# Patient Record
Sex: Male | Born: 1950
Health system: Southern US, Community
[De-identification: ages and names within clinical notes are randomized; demographics above are authoritative.]

## PROBLEM LIST (undated history)

## (undated) DIAGNOSIS — N210 Calculus in bladder: Secondary | ICD-10-CM

## (undated) DIAGNOSIS — R519 Headache, unspecified: Secondary | ICD-10-CM

## (undated) DIAGNOSIS — M1990 Unspecified osteoarthritis, unspecified site: Secondary | ICD-10-CM

## (undated) DIAGNOSIS — I10 Essential (primary) hypertension: Secondary | ICD-10-CM

## (undated) DIAGNOSIS — G4730 Sleep apnea, unspecified: Secondary | ICD-10-CM

## (undated) DIAGNOSIS — Z9289 Personal history of other medical treatment: Secondary | ICD-10-CM

## (undated) DIAGNOSIS — I493 Ventricular premature depolarization: Secondary | ICD-10-CM

## (undated) DIAGNOSIS — I452 Bifascicular block: Secondary | ICD-10-CM

## (undated) DIAGNOSIS — J189 Pneumonia, unspecified organism: Secondary | ICD-10-CM

## (undated) DIAGNOSIS — C4490 Unspecified malignant neoplasm of skin, unspecified: Secondary | ICD-10-CM

## (undated) HISTORY — DX: Essential (primary) hypertension: I10

## (undated) HISTORY — DX: Ventricular premature depolarization: I49.3

## (undated) HISTORY — DX: Bifascicular block: I45.2

## (undated) HISTORY — PX: MOHS SURGERY: SUR867

## (undated) HISTORY — DX: Personal history of other medical treatment: Z92.89

## (undated) HISTORY — PX: OTHER SURGICAL HISTORY: SHX169

## (undated) HISTORY — PX: HEMORROIDECTOMY: SUR656

## (undated) HISTORY — PX: SHOULDER SURGERY: SHX246

## (undated) HISTORY — PX: TONSILLECTOMY: SUR1361

## (undated) HISTORY — PX: KNEE SURGERY: SHX244

## (undated) HISTORY — PX: VASECTOMY: SHX75

---

## 2005-07-12 ENCOUNTER — Ambulatory Visit (HOSPITAL_COMMUNITY): Admission: RE | Admit: 2005-07-12 | Discharge: 2005-07-12 | Payer: Self-pay | Admitting: Specialist

## 2007-04-27 ENCOUNTER — Ambulatory Visit: Payer: Self-pay

## 2007-05-06 ENCOUNTER — Ambulatory Visit: Payer: Self-pay | Admitting: Orthopaedic Surgery

## 2007-05-07 ENCOUNTER — Ambulatory Visit: Payer: Self-pay | Admitting: Orthopaedic Surgery

## 2009-02-14 ENCOUNTER — Ambulatory Visit: Payer: Self-pay | Admitting: Surgery

## 2009-02-15 ENCOUNTER — Ambulatory Visit: Payer: Self-pay | Admitting: Surgery

## 2012-07-21 ENCOUNTER — Emergency Department: Payer: Self-pay | Admitting: Emergency Medicine

## 2012-07-22 LAB — COMPREHENSIVE METABOLIC PANEL
Albumin: 4 g/dL (ref 3.4–5.0)
Alkaline Phosphatase: 73 U/L (ref 50–136)
Anion Gap: 12 (ref 7–16)
BUN: 14 mg/dL (ref 7–18)
Bilirubin,Total: 1.1 mg/dL — ABNORMAL HIGH (ref 0.2–1.0)
Calcium, Total: 9.2 mg/dL (ref 8.5–10.1)
Chloride: 102 mmol/L (ref 98–107)
Co2: 27 mmol/L (ref 21–32)
Creatinine: 0.83 mg/dL (ref 0.60–1.30)
EGFR (African American): >60
EGFR (Non-African Amer.): >60
Glucose: 117 mg/dL — ABNORMAL HIGH (ref 65–99)
Osmolality: 283 (ref 275–301)
Potassium: 3.4 mmol/L — ABNORMAL LOW (ref 3.5–5.1)
SGOT(AST): 14 U/L — ABNORMAL LOW (ref 15–37)
SGPT (ALT): 16 U/L (ref 12–78)
Sodium: 141 mmol/L (ref 136–145)
Total Protein: 7.4 g/dL (ref 6.4–8.2)

## 2012-07-22 LAB — URINALYSIS, COMPLETE
Bacteria: NONE SEEN
Bilirubin,UR: NEGATIVE
Blood: NEGATIVE
Glucose,UR: NEGATIVE mg/dL (ref 0–75)
Ketone: NEGATIVE
Leukocyte Esterase: NEGATIVE
Nitrite: NEGATIVE
Ph: 6 (ref 4.5–8.0)
Protein: NEGATIVE
RBC,UR: NONE SEEN /HPF (ref 0–5)
Specific Gravity: 1.005 (ref 1.003–1.030)
Squamous Epithelial: NONE SEEN
WBC UR: <1 /HPF (ref 0–5)

## 2012-07-22 LAB — CBC
HCT: 39.1 % — ABNORMAL LOW (ref 40.0–52.0)
HGB: 13.8 g/dL (ref 13.0–18.0)
MCH: 30.1 pg (ref 26.0–34.0)
MCHC: 35.3 g/dL (ref 32.0–36.0)
MCV: 85 fL (ref 80–100)
Platelet: 205 10*3/uL (ref 150–440)
RBC: 4.59 10*6/uL (ref 4.40–5.90)
RDW: 14.2 % (ref 11.5–14.5)
WBC: 10.4 10*3/uL (ref 3.8–10.6)

## 2012-07-22 LAB — CK TOTAL AND CKMB (NOT AT ARMC)
CK, Total: 118 U/L (ref 35–232)
CK-MB: 1.5 ng/mL (ref 0.5–3.6)

## 2012-07-22 LAB — TROPONIN I: Troponin-I: <0.02 ng/mL

## 2012-07-23 ENCOUNTER — Inpatient Hospital Stay: Payer: Self-pay | Admitting: Internal Medicine

## 2012-07-23 LAB — CBC
HCT: 41.6 % (ref 40.0–52.0)
HGB: 14.1 g/dL (ref 13.0–18.0)
MCHC: 33.8 g/dL (ref 32.0–36.0)
Platelet: 233 10*3/uL (ref 150–440)
RBC: 4.86 10*6/uL (ref 4.40–5.90)
RDW: 14.1 % (ref 11.5–14.5)
WBC: 11.5 10*3/uL — ABNORMAL HIGH (ref 3.8–10.6)

## 2012-07-23 LAB — HEPATIC FUNCTION PANEL A (ARMC)
Alkaline Phosphatase: 67 U/L (ref 50–136)
Bilirubin, Direct: 0.2 mg/dL (ref 0.00–0.20)
Bilirubin,Total: 1.2 mg/dL — ABNORMAL HIGH (ref 0.2–1.0)
SGPT (ALT): 18 U/L (ref 12–78)
Total Protein: 7.7 g/dL (ref 6.4–8.2)

## 2012-07-23 LAB — BASIC METABOLIC PANEL
Anion Gap: 7 (ref 7–16)
BUN: 11 mg/dL (ref 7–18)
Calcium, Total: 9.2 mg/dL (ref 8.5–10.1)
Chloride: 103 mmol/L (ref 98–107)
Co2: 28 mmol/L (ref 21–32)
Creatinine: 0.86 mg/dL (ref 0.60–1.30)
EGFR (African American): >60
Glucose: 103 mg/dL — ABNORMAL HIGH (ref 65–99)
Potassium: 3.6 mmol/L (ref 3.5–5.1)
Sodium: 138 mmol/L (ref 136–145)

## 2012-07-24 LAB — CBC WITH DIFFERENTIAL/PLATELET
Basophil #: 0 10*3/uL (ref 0.0–0.1)
Basophil %: 0.5 %
Eosinophil #: 0.4 10*3/uL (ref 0.0–0.7)
Eosinophil %: 4.2 %
HCT: 36.9 % — ABNORMAL LOW (ref 40.0–52.0)
HGB: 12.9 g/dL — ABNORMAL LOW (ref 13.0–18.0)
Lymphocyte #: 1 10*3/uL (ref 1.0–3.6)
MCH: 29.6 pg (ref 26.0–34.0)
MCHC: 34.9 g/dL (ref 32.0–36.0)
MCV: 85 fL (ref 80–100)
Monocyte #: 0.7 x10 3/mm (ref 0.2–1.0)
Monocyte %: 8.1 %
Neutrophil #: 6.9 10*3/uL — ABNORMAL HIGH (ref 1.4–6.5)
Neutrophil %: 75.7 %
Platelet: 209 10*3/uL (ref 150–440)
RBC: 4.34 10*6/uL — ABNORMAL LOW (ref 4.40–5.90)
RDW: 14 % (ref 11.5–14.5)
WBC: 9.1 10*3/uL (ref 3.8–10.6)

## 2012-07-29 LAB — CULTURE, BLOOD (SINGLE)

## 2013-08-10 ENCOUNTER — Encounter: Payer: Self-pay | Admitting: Interventional Cardiology

## 2013-08-10 ENCOUNTER — Encounter: Payer: Self-pay | Admitting: *Deleted

## 2013-08-10 DIAGNOSIS — I10 Essential (primary) hypertension: Secondary | ICD-10-CM | POA: Insufficient documentation

## 2013-08-10 DIAGNOSIS — R9430 Abnormal result of cardiovascular function study, unspecified: Secondary | ICD-10-CM | POA: Insufficient documentation

## 2013-08-11 ENCOUNTER — Ambulatory Visit (INDEPENDENT_AMBULATORY_CARE_PROVIDER_SITE_OTHER): Payer: BC Managed Care – PPO | Admitting: Interventional Cardiology

## 2013-08-11 ENCOUNTER — Encounter: Payer: Self-pay | Admitting: Interventional Cardiology

## 2013-08-11 ENCOUNTER — Encounter (INDEPENDENT_AMBULATORY_CARE_PROVIDER_SITE_OTHER): Payer: Self-pay

## 2013-08-11 VITALS — BP 142/90 | HR 66 | Ht 72.0 in | Wt 221.8 lb

## 2013-08-11 DIAGNOSIS — I059 Rheumatic mitral valve disease, unspecified: Secondary | ICD-10-CM

## 2013-08-11 DIAGNOSIS — I10 Essential (primary) hypertension: Secondary | ICD-10-CM

## 2013-08-11 DIAGNOSIS — R9430 Abnormal result of cardiovascular function study, unspecified: Secondary | ICD-10-CM

## 2013-08-11 MED ORDER — METOPROLOL SUCCINATE ER 50 MG PO TB24: 50.0000 mg | ORAL_TABLET | Freq: Every day | ORAL | Status: DC

## 2013-08-11 NOTE — Progress Notes (Signed)
Patient ID: Jerry Ruiz, male   DOB: 10/10/1950, 62 y.o.   MRN: [id number]    [address] [place], [place]  [address] Phone: [phone] Fax:  [phone]  Date:  08/11/2013   ID:  Jerry Ruiz, DOB Jan 01, 1951, MRN [id number]  PCP:  No primary provider on file.      History of Present Illness: Jerry Ruiz is a 62 y.o. male who had severe left chest pain with SHOB in 11/13. He went to the ER due to the severe pain with inspiration. He went by EMS and saw a doctor who got a CT scan. No PE by the scan. He mentioned the h/o lower back pain and he received muscle relaxants but he had only some relief. He got better with Morphine. He thought he may have pleurisy. The ER calle dhim the next day to check on him and it was noticed that he was still [facility]. He went back to the ER. The CT scan was repeated for better quality. Again, it was negative for PE, but there was a suspicion of pneumonia. He got levaquin in the hospital for a few days.  It was also noted that he had a pericardial cyst and a gallstone. He was told to see a cardiologist for the cyst. He had an echo in 1/14 showing no cyst.  It may have been too small. Currently, he feels back to normal. He is exercising regularly. He has not had any CP, cough, SHOB. 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 He exercises 5 days a week. He walks for 45-60 minutes. He has lost 25 lbs in the last year, but gained 10 lbs back after exercise was limted due to ear surgery for skin cancer. Keeps his heart rate to 120 -130 during exercise.  Since June 09, 2013 he has been off of benicar.  He tapered off.  He also stopped the HCTZ in early October 2014.  He was hoping to get off of some meds since he retired.  He has felt better off of the meds.   BP is higher in the MDs office compared to home readings.      Wt Readings from Last 3 Encounters:  08/11/13 221 lb 12.8 oz (100.608 kg)     Past Medical History  Diagnosis Date  .  Nonspecific abnormal unspecified cardiovascular function study   . HTN (hypertension)     Current Outpatient Prescriptions  Medication Sig Dispense Refill  . ALPHA LIPOIC ACID PO Take 300 mg by mouth daily.      Marland Kitchen Bioflavonoid Products (ESTER C PO) Take 1 tablet by mouth daily.      . Cholecalciferol (VITAMIN D) 2000 UNITS CAPS Take 1 capsule by mouth daily.      . Coenzyme Q10 (COQ-10) 100 MG CAPS Take 100 mg by mouth daily.      . Magnesium 250 MG TABS Take 1 tablet by mouth daily.      . metoprolol succinate (TOPROL-XL) 50 MG 24 hr tablet Take 50 mg by mouth daily. Take with or immediately following a meal.      . Multiple Vitamin (MULTIVITAMIN) capsule Take 1 capsule by mouth daily.      . Omega-3 Fatty Acids (FISH OIL) 1000 MG CAPS Take 1,000 mg by mouth daily.      . Probiotic Product (PROBIOTIC DAILY PO) Take 1 tablet by mouth daily.       No current facility-administered  medications for this visit.    Allergies:    Allergies  Allergen Reactions  . Sulfa Antibiotics Rash    Social History:  The patient  reports that he has quit smoking. He does not have any smokeless tobacco history on file.   Family History:  The patient's family history includes Arrhythmia in his mother; Heart disease in his mother; Hypertension in his father and mother; Parkinson's disease in his father.   ROS:  Please see the history of present illness.  No nausea, vomiting.  No fevers, chills.  No focal weakness.  No dysuria.    All other systems reviewed and negative.   PHYSICAL EXAM: VS:  BP 142/90  Pulse 66  Ht 6' (1.829 m)  Wt 221 lb 12.8 oz (100.608 kg)  BMI 30.07 kg/m2 Well nourished, well developed, in no acute distress HEENT: normal Neck: no JVD, no carotid bruits Cardiac:  normal S1, S2; RRR; 1/6 systolic murmur Lungs:  clear to auscultation bilaterally, no wheezing, rhonchi or rales Abd: soft, nontender, no hepatomegaly Ext: no edema Skin: warm and dry Neuro:   no focal abnormalities  noted  EKG:      ASSESSMENT AND PLAN:  Nonspecific abnormal unspecified cardiovascular function study  Abnormal CT scan showing pericardial cyst, 6 cm. patient has had echocardiograms in the past without a pericardial cyst been mentioned to him. He has had nuclear stress test in the past with Dr. Shelva Majestic a few years ago.  LVH noted on ECho in 2014.  Reviewed echo report. Discussed restarting ARB at lower dose to help with regression of LVH.    2. Hypertension, essential  Stopped Benicar Tablet, 40 MG, 1 tablet, Orally, Once a day StoppedHCTZ 25 MG Tablet, 1/2 tablet, Orally, q AM Continue Metoprolol Succinate Tablet Extended Release 24 Hour, 50 MG, 1 tablet, Orally, Once a day Controlled today. Continue efforts at weight loss. This may help him get off some medication. Check Readings at home.  He should be relaxed for 10 minutes.  If readings are above 140/90, would restart Benicar 10 mg daily or irbesartan 75 mg daily if he wants generic.  We'll hold off on adding any medication at this time. He feels better off of medicine.  Call in with some blood pressure readings in the next few weeks.    3. Chest pain, unspecified  I don't think the chest pain he had in  2013was coming from the pericardial cyst.   4.  Mitral regurgitation: mild to moderate on the echo in 2014.  No sx of CHF.   Follow Up  1 Year      Signed, Fredric Mare, MD, [facility] 08/11/2013 11:06 AM

## 2013-08-11 NOTE — Patient Instructions (Signed)
Your physician wants you to follow-up in: 1 year with Dr. Eldridge Dace. You will receive a reminder letter in the mail two months in advance. If you don't receive a letter, please call our office to schedule the follow-up appointment.  Your physician has requested that you regularly monitor and record your blood pressure readings at home. Please use the same machine at the same time of day to check your readings and record them. Call us with readings in a week or so.

## 2013-10-06 ENCOUNTER — Ambulatory Visit: Payer: Self-pay | Admitting: Interventional Cardiology

## 2014-05-20 ENCOUNTER — Ambulatory Visit (INDEPENDENT_AMBULATORY_CARE_PROVIDER_SITE_OTHER): Payer: BC Managed Care – PPO | Admitting: Interventional Cardiology

## 2014-05-20 ENCOUNTER — Encounter: Payer: Self-pay | Admitting: Interventional Cardiology

## 2014-05-20 VITALS — BP 142/82 | HR 66 | Ht 72.0 in | Wt 221.0 lb

## 2014-05-20 DIAGNOSIS — Z7689 Persons encountering health services in other specified circumstances: Secondary | ICD-10-CM

## 2014-05-20 DIAGNOSIS — R5383 Other fatigue: Secondary | ICD-10-CM

## 2014-05-20 DIAGNOSIS — R5381 Other malaise: Secondary | ICD-10-CM

## 2014-05-20 DIAGNOSIS — I493 Ventricular premature depolarization: Secondary | ICD-10-CM

## 2014-05-20 DIAGNOSIS — I10 Essential (primary) hypertension: Secondary | ICD-10-CM

## 2014-05-20 DIAGNOSIS — Z7189 Other specified counseling: Secondary | ICD-10-CM

## 2014-05-20 DIAGNOSIS — I059 Rheumatic mitral valve disease, unspecified: Secondary | ICD-10-CM

## 2014-05-20 DIAGNOSIS — I4949 Other premature depolarization: Secondary | ICD-10-CM

## 2014-05-20 NOTE — Patient Instructions (Signed)
Written rx given to you for labwork (tsh, cmet and cbc). Please have labs faxed back to [place] at [phone].  Your physician recommends that you continue on your current medications as directed. Please refer to the Current Medication list given to you today.  Your physician has requested that you have an echocardiogram. Echocardiography is a painless test that uses sound waves to create images of your heart. It provides your doctor with information about the size and shape of your heart and how well your heart's chambers and valves are working. This procedure takes approximately one hour. There are no restrictions for this procedure.  You have been referred to Dr. Lajean Manes to establish care.   Your physician wants you to follow-up in: 1 year with Dr. Irish Lack. You will receive a reminder letter in the mail two months in advance. If you don't receive a letter, please call our office to schedule the follow-up appointment.

## 2014-05-20 NOTE — Progress Notes (Signed)
Patient ID: KIA STAVROS, male   DOB: [birth year], 63 y.o.   MRN: [id number]  Patient ID: TERRIE GRAJALES, male   DOB: [birth year], 63 y.o.   MRN: [id number]    [address] [place], [place]  [address] Phone: [phone] Fax:  [phone]  Date:  05/20/2014   ID:  GENEROSO CROPPER, DOB [birth year], MRN [id number]  PCP:  No primary provider on file.      History of Present Illness: PHONG ISENBERG is a 63 y.o. male who had severe left chest pain with SHOB in 11/13. He went to the ER due to the severe pain with inspiration. He went by EMS and saw a doctor who got a CT scan. No PE by the scan. He mentioned the h/o lower back pain and he received muscle relaxants but he had only some relief. He got better with Morphine. He thought he may have pleurisy. The ER calle dhim the next day to check on him and it was noticed that he was still [facility]. He went back to the ER. The CT scan was repeated for better quality. Again, it was negative for PE, but there was a suspicion of pneumonia. He got levaquin in the hospital for a few days.  It was also noted that he had a pericardial cyst and a gallstone. He was told to see a cardiologist for the cyst. He had an echo in 1/14 showing no cyst.  It may have been too small. Currently, he feels back to normal. He is exercising regularly. He has not had any CP, cough, SHOB. 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 He exercises 5 days a week. He walks for 45-60 minutes. He has lost 25 lbs in the last year, but gained 10 lbs back after exercise was limted due to ear surgery for skin cancer. Keeps his heart rate to 120 -130 during exercise.  Since June 09, 2013 he has been off of benicar.  He tapered off.  He also stopped the HCTZ in early October 2014.  He was hoping to get off of some meds since he retired.  He has felt better off of the meds.   BP is higher in the MDs office compared to home readings.      Wt Readings from Last 3 Encounters:  05/20/14 221 lb  (100.245 kg)  08/11/13 221 lb 12.8 oz (100.608 kg)     Past Medical History  Diagnosis Date  . Nonspecific abnormal unspecified cardiovascular function study   . HTN (hypertension)     Current Outpatient Prescriptions  Medication Sig Dispense Refill  . ALPHA LIPOIC ACID PO Take 300 mg by mouth daily.      Marland Kitchen arginine 500 MG tablet Take 500 mg by mouth 2 (two) times daily.      Marland Kitchen Bioflavonoid Products (ESTER C PO) Take 1 tablet by mouth daily.      . Cholecalciferol (VITAMIN D) 2000 UNITS CAPS Take 1 capsule by mouth daily.      Marland Kitchen FISH OIL-COENZYME Q10 PO Take by mouth.      . Lactobacillus (ACIDOPHILUS) 100 MG CAPS Take by mouth.      . Magnesium 250 MG TABS Take 1 tablet by mouth daily.      . metoprolol succinate (TOPROL-XL) 50 MG 24 hr tablet Take 1 tablet (50 mg total) by mouth daily. Take with or immediately following a meal.  90 tablet  3  . Multiple  Vitamin (MULTIVITAMIN) capsule Take 1 capsule by mouth daily.      [name] Leaf Extract 250 MG CAPS Take 450 mg by mouth.      . Probiotic Product (PROBIOTIC DAILY PO) Take 1 tablet by mouth daily.      . Saw Palmetto 450 MG CAPS Take by mouth.       No current facility-administered medications for this visit.    Allergies:    Allergies  Allergen Reactions  . Sulfa Antibiotics Rash, Hives and Itching    Social History:  The patient  reports that he has quit smoking. He does not have any smokeless tobacco history on file.   Family History:  The patient's family history includes Arrhythmia in his mother; Heart disease in his mother; Hypertension in his father and mother; Parkinson's disease in his father.   ROS:  Please see the history of present illness.  No nausea, vomiting.  No fevers, chills.  No focal weakness.  No dysuria.    All other systems reviewed and negative.   PHYSICAL EXAM: VS:  BP 142/82  Pulse 66  Ht 6' (1.829 m)  Wt 221 lb (100.245 kg)  BMI 29.97 kg/m2 Well nourished, well developed, in no acute  distress HEENT: normal Neck: no JVD, no carotid bruits Cardiac:  normal S1, S2; RRR; 1/6 systolic murmur Lungs:  clear to auscultation bilaterally, no wheezing, rhonchi or rales Abd: soft, nontender, no hepatomegaly Ext: no edema Skin: warm and dry Neuro:   no focal abnormalities noted  EKG:    NSR, No ST segment changes  ASSESSMENT AND PLAN:  Nonspecific abnormal unspecified cardiovascular function study  Abnormal CT scan showing pericardial cyst, 6 cm. patient has had echocardiograms in the past without a pericardial cyst been mentioned to him. He has had nuclear stress test in the past with Dr. Rayford Halsted a few years ago.  LVH noted on ECho in 2014.  Reviewed echo report. Discussed restarting ARB at lower dose to help with regression of LVH.    2. Hypertension, essential  Stopped Benicar Tablet, 40 MG, 1 tablet, Orally, Once a day StoppedHCTZ 25 MG Tablet, 1/2 tablet, Orally, q AM Continue Metoprolol Succinate Tablet Extended Release 24 Hour, 50 MG, 1 tablet, Orally, Once a day Controlled today. Continue efforts at weight loss. This may help him get off some medication. Check Readings at home.  He should be relaxed for 10 minutes.  If readings are above 140/90, would restart Benicar 10 mg daily or irbesartan 75 mg daily if he wants generic.  We'll hold off on adding any medication at this time. He feels better off of medicine.  Call in with some blood pressure readings if they are elevated.  He is very reluctant to go on meds.  .    3. Chest pain, unspecified  I don't think the chest pain he had in  2013 was coming from the pericardial cyst.   4.  Mitral regurgitation: mild to moderate on the echo in 2014.  No sx of CHF.  DOE with exercise , mild. Check echo. 5. PVCs are getting more frequent despite metoprolol.  His caffeine intake has increased as well.  He does not think the caffeine had anything to do with his PVCs. I explained him that we do a monitor and if he had more than 20,000  PVCs in the day, he potentially would need more invasive treatment. We discussed antiarrhythmic but he is not interested. Fatigue: Will check bloodwork.  He requests  a referral to Dr. Felipa Eth.   CBC, TSH, Lipids, CMet.  He does not feel like doing as much.  I am not convinced this is cardiac. Follow Up  1 Year      Signed, Mina Marble, MD, [facility] 05/20/2014 3:34 PM

## 2014-05-24 ENCOUNTER — Encounter: Payer: Self-pay | Admitting: Interventional Cardiology

## 2014-05-24 ENCOUNTER — Other Ambulatory Visit: Payer: Self-pay | Admitting: Interventional Cardiology

## 2014-05-24 LAB — LIPID PANEL
Cholesterol: 153 mg/dL (ref 0–200)
HDL: 30 mg/dL — ABNORMAL LOW (ref >39)
LDL Cholesterol: 105 mg/dL — ABNORMAL HIGH (ref 0–99)
Total CHOL/HDL Ratio: 5.1 Ratio
Triglycerides: 89 mg/dL (ref <150)
VLDL: 18 mg/dL (ref 0–40)

## 2014-05-24 LAB — COMPREHENSIVE METABOLIC PANEL
ALT: 20 U/L (ref 0–53)
AST: 16 U/L (ref 0–37)
Albumin: 4.3 g/dL (ref 3.5–5.2)
Alkaline Phosphatase: 63 U/L (ref 39–117)
BUN: 13 mg/dL (ref 6–23)
CO2: 28 mEq/L (ref 19–32)
Calcium: 9 mg/dL (ref 8.4–10.5)
Chloride: 108 mEq/L (ref 96–112)
Creat: 0.89 mg/dL (ref 0.50–1.35)
Glucose, Bld: 111 mg/dL — ABNORMAL HIGH (ref 70–99)
Potassium: 4.3 mEq/L (ref 3.5–5.3)
Sodium: 142 mEq/L (ref 135–145)
Total Bilirubin: 1.2 mg/dL (ref 0.2–1.2)
Total Protein: 6.2 g/dL (ref 6.0–8.3)

## 2014-05-24 LAB — CBC
HCT: 40.8 % (ref 39.0–52.0)
Hemoglobin: 13.7 g/dL (ref 13.0–17.0)
MCH: 28.3 pg (ref 26.0–34.0)
MCHC: 33.6 g/dL (ref 30.0–36.0)
MCV: 84.3 fL (ref 78.0–100.0)
Platelets: 232 10*3/uL (ref 150–400)
RBC: 4.84 MIL/uL (ref 4.22–5.81)
RDW: 14.8 % (ref 11.5–15.5)
WBC: 5.5 10*3/uL (ref 4.0–10.5)

## 2014-05-24 LAB — TSH: TSH: 1.348 u[IU]/mL (ref 0.350–4.500)

## 2014-08-09 ENCOUNTER — Ambulatory Visit (HOSPITAL_COMMUNITY): Payer: BC Managed Care – PPO | Attending: Cardiovascular Disease | Admitting: Radiology

## 2014-08-09 ENCOUNTER — Other Ambulatory Visit: Payer: Self-pay

## 2014-08-09 DIAGNOSIS — I059 Rheumatic mitral valve disease, unspecified: Secondary | ICD-10-CM | POA: Diagnosis not present

## 2014-08-09 DIAGNOSIS — I493 Ventricular premature depolarization: Secondary | ICD-10-CM

## 2014-08-09 NOTE — Progress Notes (Signed)
Echocardiogram performed.  

## 2014-10-21 ENCOUNTER — Other Ambulatory Visit: Payer: Self-pay

## 2014-10-21 MED ORDER — METOPROLOL SUCCINATE ER 50 MG PO TB24: 50.0000 mg | ORAL_TABLET | Freq: Every day | ORAL | Status: DC

## 2014-12-27 NOTE — Discharge Summary (Signed)
PATIENT NAME:  Jerry Ruiz, Jerry Ruiz MR#:  [id number] DATE OF BIRTH:  22-Oct-1950  DATE OF ADMISSION:  07/23/2012 DATE OF DISCHARGE:  07/25/2012  PRIMARY CARE PHYSICIAN:  Dr. Isidore Moos, [place].   FINAL DIAGNOSES:  1. Pneumonia with pleurisy.  2. Hypertension.  3. Pericardial cyst.  4. Compression fracture of the thoracic spine.  5. Anxiety.   MEDICATIONS ON DISCHARGE:  1. Percocet 5/325, one tablet every six hours as needed. 2. Diazepam 2 mg 4 times a day.  3. Co-Q10 100 mg daily.  4. Calcium carbonate 1 tablet daily.  5. Hydrochlorothiazide 12.5 mg daily.  6. Benicar 40 mg daily.  7. Fish oil 1 tablet daily.  8. Metoprolol 50 mg extended-release daily.  9. Multivitamin daily.  10. Levofloxacin 750 mg 1 tablet every 24 hours for five more days.   DIET: Regular consistency.   ACTIVITY: Activity as tolerated.   FOLLOWUP: Follow-up with Dr. Nehemiah Massed, cardiology, for opinion on pericardial cyst, 1 to 2 weeks with Dr. Isidore Moos in [place].   REASON FOR ADMISSION: The patient was admitted 07/23/2012, discharged 07/25/2012, came in with chest pain on the left side for four days with shortness of breath.   HISTORY OF PRESENT ILLNESS: The patient is a 64 year old man with hypertension who presents with chest pain and shortness of breath. He was found to have a pneumonia and was started on Levaquin and nebulizers.   LABORATORY, DIAGNOSTIC AND RADIOLOGICAL DATA: Liver function tests that were normal range. Total bilirubin slightly elevated at 1.2. Glucose 103, BUN 11, creatinine 0.86, sodium 138, potassium 3.6, chloride 103, CO2 28, calcium 9.2. White blood cell count 11.5, hemoglobin and hematocrit 14.1 and 41.6, platelet count 233. EKG showed normal sinus rhythm, left axis deviation, left ventricular hypertrophy. Chest x-ray: Low lung volumes. Basilar opacities, age indeterminate, mild anterior wedge compression deformity mid to lower thoracic spine. Blood cultures no growth in 36 hours.  White count upon discharge 9.1. CT scan of the chest showed bibasilar airspace disease, may reflect atelectasis versus pneumonia. Right upper pericardial mass likely representing a pericardial cyst. Measurements are 6 x 4.9 cm. Hypodense fluid attenuating upper pericardial mass.   HOSPITAL COURSE PER PROBLEM LIST:  1. For the patient's pneumonia with pleurisy, this had improved with IV Levaquin. We will send home on oral Levaquin and finish up a seven-day course.  2. For the patient's hypertension, he was kept on his usual medications. Blood pressure upon discharge was 138/77.  3. For this pericardial cyst seen on CT scan, I did recommend a follow-up with cardiology for possible echocardiogram. His mother sees Dr. Nehemiah Massed and he will set up an appointment with Dr. Nehemiah Massed.  4. Compression fracture of thoracic spine, age indeterminate. The patient is not having symptoms. I advised him to just follow up with his medical doctor.  5. Anxiety. The patient is on diazepam.   TIME SPENT ON DISCHARGE: 35 minutes.   ____________________________ Tana Conch. Leslye Peer, MD rjw:ap D: 07/25/2012 14:43:19 ET T: 07/27/2012 10:57:23 ET JOB#: [id number] cc: Tana Conch. Leslye Peer, MD, <Dictator> Corey Skains, MD Dr. Neita Carp, [place], [place], Fax [phone] Jerry Brooklyn MD ELECTRONICALLY SIGNED 08/10/2012 17:21

## 2014-12-27 NOTE — H&P (Signed)
PATIENT NAMENEEMA, Ruiz MR#:  [id number] DATE OF BIRTH:  September 18, 1950  DATE OF ADMISSION:  07/23/2012  PRIMARY CARE PHYSICIAN: Dr. Jeananne Rama   REFERRING PHYSICIAN: Dr. Renard Hamper   CHIEF COMPLAINT: Chest pain on the left side for four days with shortness of breath.   HISTORY OF PRESENT ILLNESS: The patient is a 64 year old Caucasian male with a history of hypertension who presented to the ED with chest pain for four days with shortness of breath. The patient is alert, awake, oriented in no acute distress. The patient said he started to have chest pain four days ago which is on the left flank area, sharp, exacerbated by breathing and movement. The patient is unable to breathe properly due to chest pain. Then he developed shortness of breath but denies any fever or chills. No wheezing. No sputum. The patient's chest x-ray showed suspected pneumonia and CAT scan with contrast showed no PE but has pneumonia.   PAST MEDICAL HISTORY: Hypertension.   SOCIAL HISTORY: No smoking, drinking, or illicit drugs.   FAMILY HISTORY: Hypertension.   PAST SURGICAL HISTORY:  1. Right foot surgery.  2. Rotator cuff surgery. 3. Right knee arthroscopy.  4. Excision of anal polyp and hemorrhoid.   ALLERGIES: Sulfa drugs.   HOME MEDICATIONS:  1. Percocet 5/325 mg p.o. q.6 hours p.r.n.  2. Multivitamin 1 tablet p.o. daily.  3. Lopressor 50 mg p.o. daily.  4. HCTZ 12.5 mg p.o. daily.  5. Fish Oil one cap p.o. daily  6. Diazepam 2 mg 4 times a day.  7. Co-Q10 100 mg p.o. daily.  8. Calcium carbonate one tablet p.o. daily.  9. Benicar 40 mg p.o. daily.    REVIEW OF SYSTEMS: CONSTITUTIONAL: The patient denies any fever or chills. No headache or dizziness. No weakness. No weight loss. EYES: No double vision or blurred vision. ENT: No epistaxis, postnasal drip, slurred speech, or dysphagia. CARDIOVASCULAR: Positive for chest pain on the left flank area. No palpitations, orthopnea, nocturnal dyspnea. PULMONARY:  Positive for shortness of breath. No cough, sputum, wheezing, or hemoptysis. GI: No abdominal pain, nausea, vomiting, or diarrhea. No melena or bloody stool. GU: No dysuria, hematuria, or incontinence. SKIN: No rash or jaundice. HEMATOLOGY: No easy bruising or bleeding. NEUROLOGY: No syncope, loss of consciousness, or seizure.   PHYSICAL EXAMINATION:   VITAL SIGNS: Temperature 98.7, blood pressure 134/78, pulse 77, respirations 18, oxygen saturation 96% by nasal cannula.   GENERAL: The patient is alert, awake, oriented, looks anxious.   HEENT: Pupils round, equal, reactive to light and accommodation. Moist oral mucosa. Clear oropharynx.   NECK: Supple. No JVD or carotid bruits. No lymphadenopathy. No thyromegaly.    CARDIOVASCULAR: S1, S2 regular rate and rhythm. No murmurs or gallops.   RESPIRATORY: Bilateral air entry. Bilateral basilar mild crackles. No use of accessory muscles to breathe.   ABDOMEN: Soft. No distention or tenderness. No organomegaly. Bowel sounds present.   EXTREMITIES: No edema, clubbing, or cyanosis. No calf tenderness. Strong bilateral pedal pulses.   SKIN: No rash or jaundice.   NEUROLOGIC: Alert and oriented x3. No focal deficit. Power 5/5. Sensation intact.   LABORATORY, DIAGNOSTIC, AND RADIOLOGICAL DATA: Chest x-ray showed very low lung volumes with basilar opacity. May represent atelectasis, infection, or aspiration are not excluded.  CAT scan of chest with contrast showed bibasilar infiltrate in the lower lobe posteriorly consistent with pneumonia with small bilateral pleural effusion. There is likely pericardial cyst or bronchogenic cyst anteriorly on the right side. No evidence  of PE.   CAT scan of abdomen and pelvis with no evidence of small or large bowel obstruction.   EKG showed normal sinus rhythm at 84 beats per minute.   IMPRESSION:  1. Bilateral basilar pneumonia.  2. Mild leukocytosis.  3. Chest pain possibly due to pneumonia.   4. Hypertension.   PLAN OF TREATMENT:  1. The patient will be admitted to the medical floor.  2. We will start Levaquin and nebulizer p.r.n.  3. In addition, we will give Percocet and morphine p.r.n. for chest pain control and follow-up CBC.  4. Continue hypertension medications which include Benicar, Lopressor, HCTZ.  5. GI and DVT prophylaxis.   Discussed the patient's situation and plan of treatment with the patient and the patient's wife.   TIME SPENT: About 62 minutes.   ____________________________ Demetrios Loll, MD qc:drc D: 07/23/2012 20:44:21 ET T: 07/24/2012 06:55:44 ET JOB#: [id number]  cc: Demetrios Loll, MD, <Dictator>, Guadalupe Maple, MD Demetrios Loll MD ELECTRONICALLY SIGNED 07/26/2012 3:42

## 2015-12-31 NOTE — Progress Notes (Signed)
Cardiology Office Note:    Date:  01/01/2016   ID:  Jerry Ruiz, DOB 07/28/1951, MRN [id number]  PCP:  Mathews Argyle, MD  Cardiologist:  Dr. Casandra Doffing   Electrophysiologist:  n/a  Referring MD: Lajean Manes, MD   Chief Complaint  Patient presents with  . Follow-up    HTN    History of Present Illness:     Jerry Ruiz is a 65 y.o. male with a hx of Pericardial cyst noted on prior CT scan in 2013, HTN. Echocardiograms have not demonstrated pericardial cyst. Last echo 12/15. Last seen by Dr. Irish Lack 9/15.   Returns for FU.  Here alone.  He recently has noted his BP increasing.  His readings at home range 150-160/90s.  He has been having some HAs.  Denies chest pain, significant dyspnea, orthopnea, PND, edema. Denies syncope.  He went to his DOT physical recently and he was not cleared due to his BP being elevated.     Past Medical History  Diagnosis Date  . Nonspecific abnormal unspecified cardiovascular function study   . HTN (hypertension)     Past Surgical History  Procedure Laterality Date  . Knee surgery    . Shoulder surgery    . Hemorroidectomy      Current Medications: Outpatient Prescriptions Prior to Visit  Medication Sig Dispense Refill  . Cholecalciferol (VITAMIN D) 2000 UNITS CAPS Take 1 capsule by mouth daily.    Marland Kitchen FISH OIL-COENZYME Q10 PO Take 1 capsule by mouth daily. PATIENT NOT SURE OF CURRENT DOSAGE    . Magnesium 250 MG TABS Take 1 tablet by mouth daily.    . metoprolol succinate (TOPROL-XL) 50 MG 24 hr tablet Take 1 tablet (50 mg total) by mouth daily. Take with or immediately following a meal. 90 tablet 1  . Multiple Vitamin (MULTIVITAMIN) capsule Take 1 capsule by mouth daily.    [name] Leaf Extract 250 MG CAPS Take 450 mg by mouth.    . Probiotic Product (PROBIOTIC DAILY PO) Take 1 tablet by mouth daily.    . ALPHA LIPOIC ACID PO Take 300 mg by mouth daily.    Marland Kitchen arginine 500 MG tablet Take 500 mg by mouth 2 (two) times daily.      Marland Kitchen Bioflavonoid Products (ESTER C PO) Take 1 tablet by mouth daily.    . Lactobacillus (ACIDOPHILUS) 100 MG CAPS Take by mouth.    . Saw Palmetto 450 MG CAPS Take by mouth.     No facility-administered medications prior to visit.     Allergies:   Sulfa antibiotics   Social History   Social History  . Marital Status: Single    Spouse Name: N/A  . Number of Children: N/A  . Years of Education: N/A   Social History Main Topics  . Smoking status: Former Research scientist (life sciences)  . Smokeless tobacco: None  . Alcohol Use: None  . Drug Use: None  . Sexual Activity: Not Asked   Other Topics Concern  . None   Social History Narrative     Family History:  The patient's family history includes Arrhythmia in his mother; Heart disease in his mother; Hypertension in his father and mother; Parkinson's disease in his father.   ROS:   Please see the history of present illness.    Review of Systems  HENT: Positive for headaches.    All other systems reviewed and are negative.   Physical Exam:    VS:  BP 170/100 mmHg  Pulse  60  Ht 6' (1.829 m)  Wt 230 lb 6.4 oz (104.509 kg)  BMI 31.24 kg/m2   GEN: Well nourished, well developed, in no acute distress HEENT: normal Neck: no JVD, no masses Cardiac: Normal S1/S2, RRR; no murmurs, rubs, or gallops, no edema;     Respiratory:  clear to auscultation bilaterally; no wheezing, rhonchi or rales GI: soft, nontender, nondistended MS: no deformity or atrophy Skin: warm and dry Neuro: No focal deficits  Psych: Alert and oriented x 3, normal affect  Wt Readings from Last 3 Encounters:  01/01/16 230 lb 6.4 oz (104.509 kg)  05/20/14 221 lb (100.245 kg)  08/11/13 221 lb 12.8 oz (100.608 kg)      Studies/Labs Reviewed:     EKG:  EKG is  ordered today.  The ekg ordered today demonstrates Sinus bradycardia, HR 58, LAD, poor R-wave progression, QTc 461 ms  Recent Labs: No results found for requested labs within last 365 days.  3/17 (from PCP): Hemoglobin  14.7, sodium 141, potassium 4.5, creatinine 0.94, ALT 21, TC 170, TG 118, HDL 30, LDL 116  Recent Lipid Panel    Component Value Date/Time   CHOL 153 05/24/2014 0927   TRIG 89 05/24/2014 0927   HDL 30* 05/24/2014 0927   CHOLHDL 5.1 05/24/2014 0927   VLDL 18 05/24/2014 0927   LDLCALC 105* 05/24/2014 0927    Additional studies/ records that were reviewed today include:   Echo 12/15 EF 55-60%, no RWMA, mild LAE, trivial MR    ASSESSMENT:     1. Essential hypertension   2. PVC's (premature ventricular contractions)   3. Mitral valve disorder     PLAN:     In order of problems listed above:  1. HTN - BP elevated.  He used to take Benicar and HCTZ.  He previously felt tired on this.  He admits to eating out quite a bit and drinks a lot of iced tea.  We discussed trying to limit these.    -  Continue Toprol-XL at current dose.  -  Start Losartan/HCTZ 50/12.5 mg QD  -  BMET 1 week  -  FU with me in 2 weeks.  2. PVCs - Fair control. Continue beta-blocker.   3. Mitral Regurgitation - Last echo with trivial MR.  With hx of ? Pericardial cyst, will need to consider repeat echo at some point this year.  I will consider ordering once his BP is better controlled.    Medication Adjustments/Labs and Tests Ordered: Current medicines are reviewed at length with the patient today.  Concerns regarding medicines are outlined above.  Medication changes, Labs and Tests ordered today are outlined in the Patient Instructions noted below. Patient Instructions  Medication Instructions:  START HYZAAR 50/12.5 MG DAILY; RX SENT Labwork: 1 WEEK FOR BMET Testing/Procedures: NONE Follow-Up: Chelsea Primus 01/18/16 @ 9:50  Any Other Special Instructions Will Be Listed Below (If Applicable). BRING YOUR BP READINGS TO YOUR NEXT APPT If you need a refill on your cardiac medications before your next appointment, please call your pharmacy.   Signed, [name], PA-C  01/01/2016 12:18 PM      [facility] [place], [place],   [address] Phone: [phone]; Fax: [phone]

## 2016-01-01 ENCOUNTER — Ambulatory Visit (INDEPENDENT_AMBULATORY_CARE_PROVIDER_SITE_OTHER): Payer: BLUE CROSS/BLUE SHIELD | Admitting: Physician Assistant

## 2016-01-01 ENCOUNTER — Encounter: Payer: Self-pay | Admitting: Physician Assistant

## 2016-01-01 VITALS — BP 170/100 | HR 60 | Ht 72.0 in | Wt 230.4 lb

## 2016-01-01 DIAGNOSIS — I059 Rheumatic mitral valve disease, unspecified: Secondary | ICD-10-CM | POA: Diagnosis not present

## 2016-01-01 DIAGNOSIS — I10 Essential (primary) hypertension: Secondary | ICD-10-CM | POA: Diagnosis not present

## 2016-01-01 DIAGNOSIS — I493 Ventricular premature depolarization: Secondary | ICD-10-CM

## 2016-01-01 MED ORDER — LOSARTAN POTASSIUM-HCTZ 50-12.5 MG PO TABS: 1.0000 | ORAL_TABLET | Freq: Every day | ORAL | Status: DC

## 2016-01-01 NOTE — Patient Instructions (Addendum)
Medication Instructions:  START HYZAAR 50/12.5 MG DAILY; RX SENT Labwork: 1 WEEK FOR BMET Testing/Procedures: NONE Follow-Up: Chelsea Primus 01/18/16 @ 9:50  Any Other Special Instructions Will Be Listed Below (If Applicable). BRING YOUR BP READINGS TO YOUR NEXT APPT If you need a refill on your cardiac medications before your next appointment, please call your pharmacy.

## 2016-01-09 ENCOUNTER — Other Ambulatory Visit: Payer: Self-pay | Admitting: Physician Assistant

## 2016-01-09 LAB — BASIC METABOLIC PANEL
BUN: 14 mg/dL (ref 7–25)
CHLORIDE: 105 mmol/L (ref 98–110)
CO2: 23 mmol/L (ref 20–31)
Calcium: 9.1 mg/dL (ref 8.6–10.3)
Creat: 0.86 mg/dL (ref 0.70–1.25)
Glucose, Bld: 103 mg/dL — ABNORMAL HIGH (ref 65–99)
POTASSIUM: 4.1 mmol/L (ref 3.5–5.3)
Sodium: 141 mmol/L (ref 135–146)

## 2016-01-10 ENCOUNTER — Telehealth: Payer: Self-pay | Admitting: *Deleted

## 2016-01-10 NOTE — Telephone Encounter (Signed)
Lmtcb to go over lab results 

## 2016-01-10 NOTE — Telephone Encounter (Signed)
Pt has been notified of lab results by phone with verbal understanding. 

## 2016-01-10 NOTE — Telephone Encounter (Signed)
Follow up ° ° ° ° ° ° ° ° °Pt calling for lab results °

## 2016-01-18 ENCOUNTER — Ambulatory Visit (INDEPENDENT_AMBULATORY_CARE_PROVIDER_SITE_OTHER): Payer: BLUE CROSS/BLUE SHIELD | Admitting: Physician Assistant

## 2016-01-18 ENCOUNTER — Encounter: Payer: Self-pay | Admitting: Physician Assistant

## 2016-01-18 VITALS — BP 170/80 | HR 70 | Ht 72.0 in | Wt 228.0 lb

## 2016-01-18 DIAGNOSIS — I10 Essential (primary) hypertension: Secondary | ICD-10-CM | POA: Diagnosis not present

## 2016-01-18 DIAGNOSIS — I059 Rheumatic mitral valve disease, unspecified: Secondary | ICD-10-CM

## 2016-01-18 DIAGNOSIS — I493 Ventricular premature depolarization: Secondary | ICD-10-CM | POA: Diagnosis not present

## 2016-01-18 MED ORDER — LOSARTAN POTASSIUM-HCTZ 100-12.5 MG PO TABS: 1.0000 | ORAL_TABLET | Freq: Every day | ORAL | Status: DC

## 2016-01-18 NOTE — Patient Instructions (Addendum)
Medication Instructions:  Your physician has recommended you make the following change in your medication:  COMPLETE your supply of Hyzaar 50-12.5mg  daily. Then START Hyzaar 100-12.5mg  daily. An Rx has been sent to your pharmacy Labwork: Your physician recommends that you return for lab work in: 2-3 weeks after starting your increased dosage, same day as your follow up appointmet Testing/Procedures: None ordered Follow-Up: Your physician recommends that you schedule a follow-up appointment with [name], PA  2-3 weeks after starting the higher dose of Hyzaar.  Please call the office to schedule. [phone] Any Other Special Instructions Will Be Listed Below (If Applicable). Please bring your blood pressure machine with you to your appointment. If you need a refill on your cardiac medications before your next appointment, please call your pharmacy.

## 2016-01-18 NOTE — Progress Notes (Signed)
Cardiology Office Note:    Date:  01/18/2016   ID:  Jerry Ruiz, DOB [birth year], MRN [id number]  PCP:  Mathews Argyle, MD  Cardiologist:  Dr. Casandra Doffing   Electrophysiologist:  n/a  Referring MD: Lajean Manes, MD   Chief Complaint  Patient presents with  . Follow-up    Hypertension    History of Present Illness:     Jerry Ruiz is a 65 y.o. male with a hx of Pericardial cyst noted on prior CT scan in 2013, HTN. Echocardiograms have not demonstrated pericardial cyst. Last echo 12/15.   I saw him 01/01/16 with worsening blood pressure. I started him on losartan/HCTZ. He returns for follow-up.  Doing well.  The patient denies chest pain, shortness of breath, syncope, orthopnea, PND or significant pedal edema.  BP at home 144-134.     Past Medical History  Diagnosis Date  . Nonspecific abnormal unspecified cardiovascular function study   . HTN (hypertension)     Past Surgical History  Procedure Laterality Date  . Knee surgery    . Shoulder surgery    . Hemorroidectomy      Current Medications: Outpatient Prescriptions Prior to Visit  Medication Sig Dispense Refill  . Cholecalciferol (VITAMIN D) 2000 UNITS CAPS Take 1 capsule by mouth daily.    Marland Kitchen FISH OIL-COENZYME Q10 PO Take 1 capsule by mouth daily. PATIENT NOT SURE OF CURRENT DOSAGE    . Magnesium 250 MG TABS Take 1 tablet by mouth daily.    . metoprolol succinate (TOPROL-XL) 50 MG 24 hr tablet Take 1 tablet (50 mg total) by mouth daily. Take with or immediately following a meal. 90 tablet 1  . Multiple Vitamin (MULTIVITAMIN) capsule Take 1 capsule by mouth daily.    [name] Leaf Extract 250 MG CAPS Take 450 mg by mouth.    . Probiotic Product (PROBIOTIC DAILY PO) Take 1 tablet by mouth daily.    Marland Kitchen losartan-hydrochlorothiazide (HYZAAR) 50-12.5 MG tablet Take 1 tablet by mouth daily. 30 tablet 11   No facility-administered medications prior to visit.      Allergies:   Sulfa antibiotics   Social History     Social History  . Marital Status: Single    Spouse Name: N/A  . Number of Children: N/A  . Years of Education: N/A   Social History Main Topics  . Smoking status: Former Research scientist (life sciences)  . Smokeless tobacco: None  . Alcohol Use: None  . Drug Use: None  . Sexual Activity: Not Asked   Other Topics Concern  . None   Social History Narrative     Family History:  The patient's family history includes Arrhythmia in his mother; Heart disease in his mother; Hypertension in his father and mother; Parkinson's disease in his father.   ROS:   Please see the history of present illness.    ROS All other systems reviewed and are negative.   Physical Exam:    VS:  BP 170/80 mmHg  Pulse 70  Ht 6' (1.829 m)  Wt 228 lb (103.42 kg)  BMI 30.92 kg/m2   GEN: Well nourished, well developed, in no acute distress HEENT: normal Neck: no JVD, no masses Cardiac: Normal S1/S2, RRR; no murmurs, rubs, or gallops, no edema;    Respiratory:  clear to auscultation bilaterally; no wheezing, rhonchi or rales GI: soft, nontender, nondistended MS: no deformity or atrophy Skin: warm and dry Neuro: No focal deficits  Psych: Alert and oriented x 3, normal affect  Wt Readings from Last 3 Encounters:  01/18/16 228 lb (103.42 kg)  01/01/16 230 lb 6.4 oz (104.509 kg)  05/20/14 221 lb (100.245 kg)      Studies/Labs Reviewed:     EKG:  EKG is not ordered today.  The ekg ordered today demonstrates n/a  Recent Labs: 01/09/2016: BUN 14; Creat 0.86; Potassium 4.1; Sodium 141   Recent Lipid Panel    Component Value Date/Time   CHOL 153 05/24/2014 0927   TRIG 89 05/24/2014 0927   HDL 30* 05/24/2014 0927   CHOLHDL 5.1 05/24/2014 0927   VLDL 18 05/24/2014 0927   LDLCALC 105* 05/24/2014 0927    Additional studies/ records that were reviewed today include:   Echo 12/15 EF 55-60%, no RWMA, mild LAE, trivial MR    ASSESSMENT:     1. Essential hypertension   2. PVC's (premature ventricular contractions)    3. Mitral valve disorder     PLAN:     In order of problems listed above:  1. HTN - BP improved but still needs better control.  He may have a component of "white coat HTN."    -  Increase Hyzaar to 100/12.5 mg daily  -  BMET 2-3 weeks  -  Follow-up with me 2-3 weeks  2. PVCs - Fair control. Continue beta-blocker.   3. Mitral Regurgitation - Last echo with trivial MR.  With hx of ? Pericardial cyst, will need to consider repeat echo at some point this year.  I will consider ordering once his BP is better controlled.     Medication Adjustments/Labs and Tests Ordered: Current medicines are reviewed at length with the patient today.  Concerns regarding medicines are outlined above.  Medication changes, Labs and Tests ordered today are outlined in the Patient Instructions noted below. Patient Instructions  Medication Instructions:  Your physician has recommended you make the following change in your medication:  COMPLETE your supply of Hyzaar 50-12.5mg  daily. Then START Hyzaar 100-12.5mg  daily. An Rx has been sent to your pharmacy Labwork: Your physician recommends that you return for lab work in: 2-3 weeks after starting your increased dosage, same day as your follow up appointmet Testing/Procedures: None ordered Follow-Up: Your physician recommends that you schedule a follow-up appointment with [name], PA  2-3 weeks after starting the higher dose of Hyzaar.  Please call the office to schedule. [phone] Any Other Special Instructions Will Be Listed Below (If Applicable). Please bring your blood pressure machine with you to your appointment. If you need a refill on your cardiac medications before your next appointment, please call your pharmacy.    Signed, [name], PA-C  01/18/2016 10:52 AM    [facility] [place], [place], [place]  [address] Phone: [phone]; Fax: [phone]

## 2016-02-09 ENCOUNTER — Telehealth: Payer: Self-pay | Admitting: Physician Assistant

## 2016-02-09 NOTE — Telephone Encounter (Signed)
S/w pt about sweling/stiffness in knee's. Pt was thinking this could be from the Hyzaar. He stated that it started even before the increase to 100/12.5 mg Hyzaar. I advised pt per Scoot W. PA this morning that he should contact PCP that this is not coming from the Hyzaar. Pt does have h/o gout. Pt states he did not think it felt like gout that he has had before in his elbow. I advised pt to rest legs and elevate with some ice on them and to call PCP for further recommendations. Pt agreeable to plan of care.

## 2016-02-09 NOTE — Telephone Encounter (Signed)
New message      Pt c/o medication issue:  1. Name of Medication: hyzaar  2. How are you currently taking this medication (dosage and times per day)? 100-12.5mg  daily 3. Are you having a reaction (difficulty breathing--STAT)? no 4. What is your medication issue?  Since taking this medication, pt states his knees have been stiff and swollen.  Could it be a side effect?  He had no trouble when he was taking 50-12.5mg .  Please advise

## 2016-02-16 ENCOUNTER — Telehealth: Payer: Self-pay | Admitting: Physician Assistant

## 2016-02-16 DIAGNOSIS — I10 Essential (primary) hypertension: Secondary | ICD-10-CM

## 2016-02-16 NOTE — Telephone Encounter (Signed)
Spoke with patient. He would like to get his BMET done at a free-standing [facility] lab in [place]. He said he does not have to pay for labs done at this facility. Advised patient to have the labs done at least 2 days prior to the appointment in order to make sure the results are available for review at his OV on 7/6. He voiced understanding.

## 2016-02-16 NOTE — Telephone Encounter (Signed)
New Message   Pt request to have labs completed at [facility]. Please assist

## 2016-02-21 ENCOUNTER — Telehealth: Payer: Self-pay | Admitting: Physician Assistant

## 2016-02-21 DIAGNOSIS — Z79899 Other long term (current) drug therapy: Secondary | ICD-10-CM

## 2016-02-21 NOTE — Telephone Encounter (Signed)
Pt aware of importance to get blood work done. Pt stated he going to [facility] for blood work and previously they could not find orders. Order put back in to be seen by lab.

## 2016-02-21 NOTE — Telephone Encounter (Signed)
When I last saw Mr. Jerry Ruiz, I increased his Hyzaar.  I needed him to get a BMET 2-3 weeks later. Please make sure he comes in for a BMET. Order in system from previous. [name], PA-C   02/21/2016 2:31 PM

## 2016-02-21 NOTE — Addendum Note (Signed)
Addended by: Newt Minion on: 02/21/2016 03:00 PM   Modules accepted: Orders

## 2016-03-04 NOTE — Telephone Encounter (Signed)
Lmtcb in regards to having bmet done, due to Hyzaar was increased 02/09/16. Pt does have appt 7/6 with [name]. PA.

## 2016-03-05 ENCOUNTER — Telehealth: Payer: Self-pay | Admitting: *Deleted

## 2016-03-05 MED ORDER — HYDROCHLOROTHIAZIDE 12.5 MG PO CAPS: 12.5000 mg | ORAL_CAPSULE | Freq: Every day | ORAL | Status: DC

## 2016-03-05 NOTE — Telephone Encounter (Signed)
Lmom I will cb thursday when I am in the office to ask how has his BP been doing since he d/c the Hyzaar.

## 2016-03-05 NOTE — Telephone Encounter (Signed)
Pt called in today asking if he could have a Rx for HCTZ. I advised pt the Hyzaar has HCTZ in it already. Pt states he d/c Hyzaar on his own about 2 weeks. I advised pt that he should had discussed with the provider before stopping medication. States he thought it was making his edema worse and he did not know what to do. I advised pt that when I s/w him on 02/09/16 about his edema and myalgia's that he was advised per recommendation from [name], [facility] symptoms not coming from Hyzaar and that he should f/u w/PCP due to h/o gout. I asked pt if he did this and he said no. [place] and [place]. I did ask since he is at the beach, has he had any seafood. Pt answers yes to having seafood. I advised seafood generally has a lot of salt and needs to limit seafood. I asked pt has he been monitoring his weight. Pt states no but weight is about the same around 225 lb. This is 3 lb's lighter from the 5/11 ov. I advised it is important to weigh daily. Weighing daily can help the provider assess possible fluid retention. I did advise pt that I will d/w [name], PA of his request for Rx for HCTZ, though we are in clinic right now and I will cb later with his recommendations. I did ask pt if we do send in an Rx for the HCTZ[place] Pt said send to [facility] [address], [place], ph# [phone].

## 2016-03-05 NOTE — Addendum Note (Signed)
Addended by: Michae Kava on: 03/05/2016 06:47 PM   Modules accepted: Orders, Medications

## 2016-03-05 NOTE — Telephone Encounter (Signed)
I lmom advised pt ok per [name]. PA Rx for HCTZ 12.5 mg daily PRN for edema. Rx called into [facility] [place]. Pt has appt next week w/ PA for f/u.

## 2016-03-05 NOTE — Telephone Encounter (Signed)
Do we know what his BP is now that he is off of Hyzaar? He can have HCTZ 12.5 mg QD prn for edema. He should get his BP checked and see if he can get a BMET somewhere to check renal function, K+. Make sure he has FU appt scheduled. [name], PA-C   03/05/2016 5:43 PM

## 2016-03-07 NOTE — Telephone Encounter (Signed)
See phone note from 6/27

## 2016-03-12 LAB — BASIC METABOLIC PANEL
BUN: 15 mg/dL (ref 7–25)
CHLORIDE: 104 mmol/L (ref 98–110)
CO2: 24 mmol/L (ref 20–31)
Calcium: 9.2 mg/dL (ref 8.6–10.3)
Creat: 0.76 mg/dL (ref 0.70–1.25)
GLUCOSE: 142 mg/dL — AB (ref 65–99)
POTASSIUM: 4 mmol/L (ref 3.5–5.3)
SODIUM: 141 mmol/L (ref 135–146)

## 2016-03-13 ENCOUNTER — Telehealth: Payer: Self-pay | Admitting: *Deleted

## 2016-03-13 NOTE — Progress Notes (Signed)
Cardiology Office Note:    Date:  03/14/2016   ID:  Jerry Ruiz, DOB 04-Sep-1951, MRN [id number]  PCP:  Mathews Argyle, MD  Cardiologist:  Dr. Casandra Doffing   Electrophysiologist:  n/a  Referring MD: Lajean Manes, MD   Chief Complaint  Patient presents with  . Follow-up    HTN    History of Present Illness:     Jerry Ruiz is a 65 y.o. male with a hx of Pericardial cyst noted on prior CT scan in 2013, HTN. Echocardiograms have not demonstrated pericardial cyst. Last echo 12/15.   I have seen him a few times for worsening BP.  Last seen 5/17.  He called in recently and noted that he stopped Hyzaar on his own.  We sent in a Rx while he was out of town for prn HCTZ for edema.  He returns for FU.     He is here alone. He notes significant side effects to Hyzaar. He was fatigued and started to notice diffuse arthralgias. His arthralgias have not really improved. However, his fatigue has improved since stopping Hyzaar. He does note some ankle swelling as well as knee swelling. He sees his primary care doctor next week for follow-up. He denies chest pain, shortness of breath, syncope. He denies orthopnea PND.   Past Medical History  Diagnosis Date  . HTN (hypertension)   . PVC's (premature ventricular contractions)   . History of echocardiogram     a. Echo 12/15: EF 55-60%, no RWMA, mild LAE, trivial MR    Past Surgical History  Procedure Laterality Date  . Knee surgery    . Shoulder surgery    . Hemorroidectomy      Current Medications: Outpatient Prescriptions Prior to Visit  Medication Sig Dispense Refill  . Cholecalciferol (VITAMIN D) 2000 UNITS CAPS Take 1 capsule by mouth daily.    Marland Kitchen FISH OIL-COENZYME Q10 PO Take 1 capsule by mouth daily. PATIENT NOT SURE OF CURRENT DOSAGE    . hydrochlorothiazide (MICROZIDE) 12.5 MG capsule Take 1 capsule (12.5 mg total) by mouth daily. 30 capsule 0  . Magnesium 250 MG TABS Take 1 tablet by mouth daily.    . Multiple Vitamin  (MULTIVITAMIN) capsule Take 1 capsule by mouth daily.    [name] Leaf Extract 250 MG CAPS Take 450 mg by mouth.    . Probiotic Product (PROBIOTIC DAILY PO) Take 1 tablet by mouth daily.    . metoprolol succinate (TOPROL-XL) 50 MG 24 hr tablet Take 1 tablet (50 mg total) by mouth daily. Take with or immediately following a meal. (Patient not taking: Reported on 03/14/2016) 90 tablet 1   No facility-administered medications prior to visit.      Allergies:   Sulfa antibiotics   Social History   Social History  . Marital Status: Single    Spouse Name: N/A  . Number of Children: N/A  . Years of Education: N/A   Social History Main Topics  . Smoking status: Former Research scientist (life sciences)  . Smokeless tobacco: None  . Alcohol Use: None  . Drug Use: None  . Sexual Activity: Not Asked   Other Topics Concern  . None   Social History Narrative     Family History:  The patient's family history includes Arrhythmia in his mother; Heart disease in his mother; Hypertension in his father and mother; Parkinson's disease in his father.   ROS:   Please see the history of present illness.    Review of Systems  Constitution: Positive for malaise/fatigue.  Cardiovascular: Positive for leg swelling.  Neurological: Positive for dizziness.   All other systems reviewed and are negative.   Physical Exam:    VS:  BP 150/80 mmHg  Pulse 64  Ht 6' (1.829 m)  Wt 230 lb (104.327 kg)  BMI 31.19 kg/m2   Physical Exam  Constitutional: He is oriented to person, place, and time. He appears well-developed and well-nourished.  HENT:  Head: Normocephalic and atraumatic.  Neck: No JVD present.  Cardiovascular: Normal rate, regular rhythm and normal heart sounds.   No murmur heard. Pulmonary/Chest: Effort normal and breath sounds normal. He has no wheezes. He has no rales.  Abdominal: Soft. There is no tenderness.  Musculoskeletal: He exhibits no edema.  Neurological: He is alert and oriented to person, place, and time.   Skin: Skin is warm and dry.  Psychiatric: He has a normal mood and affect.    Wt Readings from Last 3 Encounters:  03/14/16 230 lb (104.327 kg)  01/18/16 228 lb (103.42 kg)  01/01/16 230 lb 6.4 oz (104.509 kg)      Studies/Labs Reviewed:     EKG:  EKG is not ordered today.  The ekg ordered today demonstrates n/a  Recent Labs: 03/11/2016: BUN 15; Creat 0.76; Potassium 4.0; Sodium 141   Recent Lipid Panel    Component Value Date/Time   CHOL 153 05/24/2014 0927   TRIG 89 05/24/2014 0927   HDL 30* 05/24/2014 0927   CHOLHDL 5.1 05/24/2014 0927   VLDL 18 05/24/2014 0927   LDLCALC 105* 05/24/2014 0927    Additional studies/ records that were reviewed today include:   Echo 12/15 EF 55-60%, no RWMA, mild LAE, trivial MR  ASSESSMENT:     1. Essential hypertension   2. PVC's (premature ventricular contractions)   3. Mitral valve disorder   4. Arthralgia     PLAN:     In order of problems listed above:  1. HTN -  BP uncontrolled.  He cannot tolerate ARBs.  He used to take Lisinopril as well.  His BP is not currently controlled on Metoprolol Succinate.  We discussed many different options for HTN including changing Toprol to Coreg, adding Amlodipine or Hytrin.  We ultimately decided to try Coreg.  -  DC Toprol-XL  -  Start Coreg 6.25 mg bid   -  FU with HTN clinic in 1 month.   2. PVCs - Fair control. Continue beta-blocker.   3. Mitral Regurgitation - Last echo with trivial MR. With hx of ? Pericardial cyst, will need to repeat echo.  Schedule Echo in 08/2016.  FU with after that.  4. Arthralgias - He can take HCTZ prn for leg edema.  He has FU with his PCP next week.  I have encouraged him to keep this as he likely needs further workup to evaluate this.     Medication Adjustments/Labs and Tests Ordered: Current medicines are reviewed at length with the patient today.  Concerns regarding medicines are outlined above.  Medication changes, Labs and Tests ordered today are  outlined in the Patient Instructions noted below. Patient Instructions  Medication Instructions:  1. STOP TOPROL 2. START COREG 6.25 MG TWICE DAILY; NEW RX  Labwork: NONE Testing/Procedures: Your physician has requested that you have an echocardiogram. Echocardiography is a painless test that uses sound waves to create images of your heart. It provides your doctor with information about the size and shape of your heart and how well your heart's chambers  and valves are working. This procedure takes approximately one hour. There are no restrictions for this procedure. THIS IS TO BE DONE ABOUT 1-2 WEEKS BEFORE Encarnacion Slates Althia Egolf, [facility] [place] 08/2016 Follow-Up: 1. [place], [facility] 08/2016 2. YOU ARE BEING REFERRED TO THE HTN CLINIC IN OUR OFFICE; THIS APPT IS TO BE IN 1 MONTH; BE SURE TO MONITOR BP AND BRING READINGS WITH YOU TO THIS APPT Any Other Special Instructions Will Be Listed Below (If Applicable). If you need a refill on your cardiac medications before your next appointment, please call your pharmacy.   Signed, [name], PA-C  03/14/2016 1:19 PM    [facility] [place], [place], [place]  [address] Phone: [phone]; Fax: [phone]

## 2016-03-13 NOTE — Telephone Encounter (Signed)
Pt notified of lab results by phone with verbal understanding. Pt confirmed his appt 03/14/16 w/Scott W. PA 9:45. Pt advised to come about 15 minutes early for registration. Pt agreeable.

## 2016-03-14 ENCOUNTER — Ambulatory Visit (INDEPENDENT_AMBULATORY_CARE_PROVIDER_SITE_OTHER): Payer: BLUE CROSS/BLUE SHIELD | Admitting: Physician Assistant

## 2016-03-14 ENCOUNTER — Encounter: Payer: Self-pay | Admitting: Physician Assistant

## 2016-03-14 VITALS — BP 150/80 | HR 64 | Ht 72.0 in | Wt 230.0 lb

## 2016-03-14 DIAGNOSIS — I059 Rheumatic mitral valve disease, unspecified: Secondary | ICD-10-CM

## 2016-03-14 DIAGNOSIS — M2550 Pain in unspecified joint: Secondary | ICD-10-CM

## 2016-03-14 DIAGNOSIS — I10 Essential (primary) hypertension: Secondary | ICD-10-CM

## 2016-03-14 DIAGNOSIS — I493 Ventricular premature depolarization: Secondary | ICD-10-CM

## 2016-03-14 MED ORDER — CARVEDILOL 6.25 MG PO TABS: 6.2500 mg | ORAL_TABLET | Freq: Two times a day (BID) | ORAL | Status: DC

## 2016-03-14 NOTE — Patient Instructions (Addendum)
Medication Instructions:  1. STOP TOPROL 2. START COREG 6.25 MG TWICE DAILY; NEW RX  Labwork: NONE Testing/Procedures: Your physician has requested that you have an echocardiogram. Echocardiography is a painless test that uses sound waves to create images of your heart. It provides your doctor with information about the size and shape of your heart and how well your heart's chambers and valves are working. This procedure takes approximately one hour. There are no restrictions for this procedure. THIS IS TO BE DONE ABOUT 1-2 WEEKS BEFORE Jerry Ruiz, [facility] [place] 08/2016 Follow-Up: 1. [place], [facility] 08/2016 2. YOU ARE BEING REFERRED TO THE HTN CLINIC IN OUR OFFICE; THIS APPT IS TO BE IN 1 MONTH; BE SURE TO MONITOR BP AND BRING READINGS WITH YOU TO THIS APPT Any Other Special Instructions Will Be Listed Below (If Applicable). If you need a refill on your cardiac medications before your next appointment, please call your pharmacy.

## 2016-04-16 ENCOUNTER — Ambulatory Visit (INDEPENDENT_AMBULATORY_CARE_PROVIDER_SITE_OTHER): Payer: BLUE CROSS/BLUE SHIELD | Admitting: Pharmacist

## 2016-04-16 VITALS — BP 142/92 | HR 71 | Wt 231.5 lb

## 2016-04-16 DIAGNOSIS — I10 Essential (primary) hypertension: Secondary | ICD-10-CM | POA: Diagnosis not present

## 2016-04-16 MED ORDER — LISINOPRIL 5 MG PO TABS: 5.0000 mg | ORAL_TABLET | Freq: Every day | ORAL | 11 refills | Status: DC

## 2016-04-16 NOTE — Patient Instructions (Addendum)
Start taking lisinopril 5mg  once daily for your blood pressure.   Continue taking carvedilol twice daily.  Check your blood pressure at home.  Check lab work and blood pressure in 2 weeks.

## 2016-04-16 NOTE — Progress Notes (Signed)
Patient ID: Jerry Ruiz                 DOB: [birth year]                      MRN: [id number]     HPI: TEAK BAKA is a 65 y.o. male referred to HTN clinic by [name], PA. PMH is significant for PVCs, HTN, and pericardial cyst noted on prior CT scan in 2013. Echocardiograms have not demonstrated pericardial cyst. Last echo 12/15. He presents to the clinic alone today for HTN f/u after Toprol was switched to carvedilol a month ago.  Patient reports fatigue with recently started carvedilol 6.25 mg BID, but reports it's "working better." Patient has been compliant with it. He reports not taking HCTZ prn any more for leg swelling. For other HTN medications he tried before, he feels "lousy" and just did not tolerate them. He used to be on low dose of lisinopril, but has been off lisinopril for 4-5 years. He is willing to try another medication except for losartan.   Patient reports "feeling dead" when taking Hyzaar with nonspecific knee/joint pain and leg swelling, so he stopped taking it on his own. He does have gout, but reports that the joint pain with Hyzaar was different than his regular gout attacks. His joint pain has still not completely resolved even after d/c Hyzaar, but improved. He is unwilling to rechallenge with losartan therapy. Does have ortho follow up for knee pain.  He had one episode of severe, sudden onset dizziness recently, unrelated to any change in his positions. Dizziness has been getting more frequent to him. Has not had any low BP readings at home.  He reports having white coat syndrome.   Office visit vitals: BP 142/92, HR 78  Current HTN meds: carvedilol 6.25 mg  Previously tried: Toprol-XL, Hyzaar (cannot tolerate ARBs d/t aches), lisinopril (became ineffective after a few years per patient), HCTZ prn BP goal: <140/90  Family History: The patient's family history includes Arrhythmia in his mother; Heart disease in his mother; Hypertension in his father and mother;  Parkinson's disease in his father.    Home BP readings: Ranging 130-150s/80s.  Wt Readings from Last 3 Encounters:  03/14/16 230 lb (104.3 kg)  01/18/16 228 lb (103.4 kg)  01/01/16 230 lb 6.4 oz (104.5 kg)   BP Readings from Last 3 Encounters:  03/14/16 (!) 150/80  01/18/16 (!) 170/80  01/01/16 (!) 170/100   Pulse Readings from Last 3 Encounters:  03/14/16 64  01/18/16 70  01/01/16 60    Renal function: CrCl cannot be calculated (Unknown ideal weight.).  Past Medical History:  Diagnosis Date  . History of echocardiogram    a. Echo 12/15: EF 55-60%, no RWMA, mild LAE, trivial MR  . HTN (hypertension)   . PVC's (premature ventricular contractions)     Current Outpatient Prescriptions on File Prior to Visit  Medication Sig Dispense Refill  . carvedilol (COREG) 6.25 MG tablet Take 1 tablet (6.25 mg total) by mouth 2 (two) times daily. 180 tablet 3  . Cholecalciferol (VITAMIN D) 2000 UNITS CAPS Take 1 capsule by mouth daily.    Marland Kitchen FISH OIL-COENZYME Q10 PO Take 1 capsule by mouth daily. PATIENT NOT SURE OF CURRENT DOSAGE    . hydrochlorothiazide (HYDRODIURIL) 12.5 MG tablet TAKE 1 TABLET DAILY AS NEEDED FOR SWELLING  0  . hydrochlorothiazide (MICROZIDE) 12.5 MG capsule Take 1 capsule (12.5 mg total) by mouth daily.  30 capsule 0  . Magnesium 250 MG TABS Take 1 tablet by mouth daily.    . Multiple Vitamin (MULTIVITAMIN) capsule Take 1 capsule by mouth daily.    [name] Leaf Extract 250 MG CAPS Take 450 mg by mouth.    . Probiotic Product (PROBIOTIC DAILY PO) Take 1 tablet by mouth daily.     No current facility-administered medications on file prior to visit.     Allergies  Allergen Reactions  . Sulfa Antibiotics Rash, Hives and Itching     Assessment/Plan:  1. Hypertension: BP improving but still above goal <140/90. Today's office visit BP was 142/92 and home BPs ranging 130s-150s/80s on carvedilol 6.25 mg BID. Cannot titrate beta blocker further given his HR in 60s.  Will initiate lisinopril 5mg  once daily and continue carvedilol. Follow up in 2 weeks for BMET and BP f/u. Pt will have labs drawn next door at [facility] and was advised to bring in home BP cuff.   Megan E. Supple, PharmD, [place] [id number] N. [address] [place], [place] [address] Phone: [phone]; Fax: [phone] 04/16/2016 2:44 PM

## 2016-05-01 ENCOUNTER — Ambulatory Visit: Payer: BLUE CROSS/BLUE SHIELD

## 2016-05-03 ENCOUNTER — Other Ambulatory Visit: Payer: Self-pay | Admitting: Physician Assistant

## 2016-05-04 LAB — BASIC METABOLIC PANEL
BUN: 12 mg/dL (ref 7–25)
CO2: 23 mmol/L (ref 20–31)
CREATININE: 0.95 mg/dL (ref 0.70–1.25)
Calcium: 9.1 mg/dL (ref 8.6–10.3)
Chloride: 103 mmol/L (ref 98–110)
GLUCOSE: 136 mg/dL — AB (ref 65–99)
POTASSIUM: 3.9 mmol/L (ref 3.5–5.3)
Sodium: 141 mmol/L (ref 135–146)

## 2016-05-06 ENCOUNTER — Telehealth: Payer: Self-pay | Admitting: *Deleted

## 2016-05-06 NOTE — Telephone Encounter (Signed)
DPR ok to lmom with results. Lab work looks good, continue current Tx plan. Any questions cb [phone].

## 2016-05-07 ENCOUNTER — Ambulatory Visit (INDEPENDENT_AMBULATORY_CARE_PROVIDER_SITE_OTHER): Payer: BLUE CROSS/BLUE SHIELD | Admitting: Pharmacist

## 2016-05-07 VITALS — BP 152/82 | HR 66

## 2016-05-07 DIAGNOSIS — I10 Essential (primary) hypertension: Secondary | ICD-10-CM | POA: Diagnosis not present

## 2016-05-07 MED ORDER — LISINOPRIL 10 MG PO TABS: 10.0000 mg | ORAL_TABLET | Freq: Every day | ORAL | 11 refills | Status: DC

## 2016-05-07 NOTE — Patient Instructions (Addendum)
Please increase your lisinopril to 10mg  once daily. Continue taking your carvedilol 6.25mg  twice a day.  Cut back on tea to 2 cups per day. Try to drink some decaf tea. Choose unsweetened tea and flavor it with stevia.   Check your blood pressure at home and record readings.  Follow up in clinic in 3 weeks.

## 2016-05-07 NOTE — Progress Notes (Signed)
Patient ID: Jerry Ruiz                 DOB: 08/12/1951                      MRN: [id number]     HPI: Jerry Ruiz is a 65 y.o. male who presents to HTN for follow up, originally referred by Jerry Dopp, PA . PMH is significant for PVCs, HTN, and pericardial cyst noted on prior CT scan in 2013. Echocardiograms have not demonstrated pericardial cyst. Last echo 12/15. At last OV 2 weeks ago, lisinopril was added to pt's BP regimen. F/u BMET was stable.  Pt reports he has been feeling well overall. Does have knee pain d/t torn meniscus - having surgery in 3 weeks. Reports this has been limiting his exercise. He would like to lose 20-25 lbs. Is drinking 4-6 cups of sweetened caffeinated tea per day. Also urinating frequently overnight. Denies blurred vision, dizziness, or headache.  Reports home BP readings mostly 140s/80s, HR 70s. Improvement from SBP ranging 150-170s prior to recent medication changes.   Current HTN meds: carvedilol 6.25 mg BID, lisinopril 5mg  daily Previously tried: Toprol-XL, Hyzaar (cannot tolerate ARBs d/t aches), lisinopril (became ineffective after a few years per patient), HCTZ prn swelling BP goal: <140/72mmHg  Family History: The patient's family history includes Arrhythmia in his mother; Heart disease in his mother; Hypertension in his father and mother; Parkinson's disease in his father.    Wt Readings from Last 3 Encounters:  04/16/16 231 lb 8 oz (105 kg)  03/14/16 230 lb (104.3 kg)  01/18/16 228 lb (103.4 kg)   BP Readings from Last 3 Encounters:  04/16/16 (!) 142/92  03/14/16 (!) 150/80  01/18/16 (!) 170/80   Pulse Readings from Last 3 Encounters:  04/16/16 71  03/14/16 64  01/18/16 70    Renal function: CrCl cannot be calculated (Unknown ideal weight.).  Past Medical History:  Diagnosis Date  . History of echocardiogram    a. Echo 12/15: EF 55-60%, no RWMA, mild LAE, trivial MR  . HTN (hypertension)   . PVC's (premature ventricular  contractions)     Current Outpatient Prescriptions on File Prior to Visit  Medication Sig Dispense Refill  . carvedilol (COREG) 6.25 MG tablet Take 1 tablet (6.25 mg total) by mouth 2 (two) times daily. 180 tablet 3  . Cholecalciferol (VITAMIN D) 2000 UNITS CAPS Take 1 capsule by mouth daily.    Marland Kitchen FISH OIL-COENZYME Q10 PO Take 1 capsule by mouth daily. PATIENT NOT SURE OF CURRENT DOSAGE    . hydrochlorothiazide (HYDRODIURIL) 12.5 MG tablet TAKE 1 TABLET DAILY AS NEEDED FOR SWELLING  0  . lisinopril (PRINIVIL,ZESTRIL) 5 MG tablet Take 1 tablet (5 mg total) by mouth daily. 30 tablet 11  . Magnesium 250 MG TABS Take 1 tablet by mouth daily.    . Multiple Vitamin (MULTIVITAMIN) capsule Take 1 capsule by mouth daily.    [name] Leaf Extract 250 MG CAPS Take 450 mg by mouth.    . Probiotic Product (PROBIOTIC DAILY PO) Take 1 tablet by mouth daily.     No current facility-administered medications on file prior to visit.     Allergies  Allergen Reactions  . Sulfa Antibiotics Rash, Hives and Itching     Assessment/Plan:  1. Hypertension - BP improving but still above goal < 140/40mmHg. High caffeine diet and knee pain likely playing a role. Will increase lisinopril to 10mg  once daily  and continue carvedilol 6.25mg  BID d/t HR at goal in the 60s. Pt will work to cut back on caffeinated tea. F/u in HTN clinic in 3 weeks before knee surgery per patient request.   Megan E. Supple, PharmD, [place] [id number] N. [address] [place], [place] [address] Phone: [phone]; Fax: [phone] 05/07/2016 3:02 PM

## 2016-05-28 ENCOUNTER — Ambulatory Visit (INDEPENDENT_AMBULATORY_CARE_PROVIDER_SITE_OTHER): Payer: BLUE CROSS/BLUE SHIELD | Admitting: Pharmacist

## 2016-05-28 ENCOUNTER — Encounter (INDEPENDENT_AMBULATORY_CARE_PROVIDER_SITE_OTHER): Payer: Self-pay

## 2016-05-28 VITALS — BP 140/88 | HR 67

## 2016-05-28 DIAGNOSIS — I10 Essential (primary) hypertension: Secondary | ICD-10-CM

## 2016-05-28 NOTE — Progress Notes (Signed)
Patient ID: Jerry Ruiz                 DOB: 12-07-1950                      MRN: [id number]     HPI: Jerry Ruiz a 65 y.o.male who presents to HTN for follow up, originally referred by Jerry Dopp, PA. PMH is significant for PVCs, HTN, and pericardial cyst noted on prior CT scan in 2013. Echocardiograms have not demonstrated pericardial cyst. Last echo 12/15 showed LVEF 55-60%. At last OV, lisinopril dose was increased to 10mg  and pt was encouraged to limit caffeinated tea intake. He presents today for f/u prior to knee surgery per patient request.  Pt reports his knee surgery is in 2 days on 05/30/16 to have his meniscus repaired. States his home BP readings have stayed consistently in the 140s/80s, HR 70s. He does report missing his evening dose of carvedilol 3x per week because he forgets to take it. He has cut back on his caffeinated tea intake from 4-6 cups per day to 0-2 cups per day. He doesn't have any caffeine past 2pm and reports that this has helped reduce his nighttime awakenings to urinate.   He used to walk 3-4 miles per day before his knee injury and reports weight gain of 20-25 lbs d/t increase in sedentary lifestyle. Knee is not causing excessive pain but is limiting his ability to exercise.  Current HTN meds:carvedilol 6.25 mg BID, lisinopril 10mg  daily, HCTZ 12.5mg  prn swelling (has not been using this) Previously tried:Toprol-XL, Hyzaar (cannot tolerate ARBs d/t aches), lisinopril (became ineffective after a few years per patient), HCTZ prn swelling BP goal: <140/69mmHg  Family History: The patient's family history includes Arrhythmia in his mother; Heart disease in his mother; Hypertension in his father and mother; Parkinson's disease in his father.  Wt Readings from Last 3 Encounters:  04/16/16 231 lb 8 oz (105 kg)  03/14/16 230 lb (104.3 kg)  01/18/16 228 lb (103.4 kg)   BP Readings from Last 3 Encounters:  05/07/16 (!) 152/82  04/16/16 (!) 142/92  03/14/16  (!) 150/80   Pulse Readings from Last 3 Encounters:  05/07/16 66  04/16/16 71  03/14/16 64    Renal function: CrCl cannot be calculated (Unknown ideal weight.).  Past Medical History:  Diagnosis Date  . History of echocardiogram    a. Echo 12/15: EF 55-60%, no RWMA, mild LAE, trivial MR  . HTN (hypertension)   . PVC's (premature ventricular contractions)     Current Outpatient Prescriptions on File Prior to Visit  Medication Sig Dispense Refill  . carvedilol (COREG) 6.25 MG tablet Take 1 tablet (6.25 mg total) by mouth 2 (two) times daily. 180 tablet 3  . Cholecalciferol (VITAMIN D) 2000 UNITS CAPS Take 1 capsule by mouth daily.    Marland Kitchen FISH OIL-COENZYME Q10 PO Take 1 capsule by mouth daily. PATIENT NOT SURE OF CURRENT DOSAGE    . hydrochlorothiazide (HYDRODIURIL) 12.5 MG tablet TAKE 1 TABLET DAILY AS NEEDED FOR SWELLING  0  . lisinopril (PRINIVIL,ZESTRIL) 10 MG tablet Take 1 tablet (10 mg total) by mouth daily. 30 tablet 11  . Magnesium 250 MG TABS Take 1 tablet by mouth daily.    . Multiple Vitamin (MULTIVITAMIN) capsule Take 1 capsule by mouth daily.    Jerry Ruiz 250 MG CAPS Take 450 mg by mouth.    . Probiotic Product (PROBIOTIC DAILY PO) Take 1  tablet by mouth daily.     No current facility-administered medications on file prior to visit.     Allergies  Allergen Reactions  . Sulfa Antibiotics Rash, Hives and Itching     Assessment/Plan:  1. Hypertension - BP at goal <140/38mmHg and much improved from systolic BP of [id number] three months ago. Will have pt continue on carvedilol and lisinopril. Advised pt to set alarm on his phone to help remind him of evening carvedilol doses. Pt would like to f/u in 2 months. This will give him time to increase activity/lose weight after knee surgery this week.    Jerry Ruiz E. Jerry Ruiz, PharmD, [place] [id number] N. [address], [place], [place] [address] Phone: [phone]; Fax: [phone] 05/28/2016 3:06  PM

## 2016-05-28 NOTE — Patient Instructions (Addendum)
It was nice to see you today.   Your blood pressure was at goal < 140/29mmHg at 140/52mmHg.   No medication changes today.  Set an alarm on your phone to help you remember your evening dose of carvedilol.  Follow up in 2 months on Monday, November 20th at 2:30pm.

## 2016-07-29 ENCOUNTER — Encounter (INDEPENDENT_AMBULATORY_CARE_PROVIDER_SITE_OTHER): Payer: Self-pay

## 2016-07-29 ENCOUNTER — Ambulatory Visit (INDEPENDENT_AMBULATORY_CARE_PROVIDER_SITE_OTHER): Payer: BLUE CROSS/BLUE SHIELD | Admitting: Pharmacist

## 2016-07-29 VITALS — BP 158/102 | HR 73 | Wt 230.2 lb

## 2016-07-29 DIAGNOSIS — I10 Essential (primary) hypertension: Secondary | ICD-10-CM

## 2016-07-29 NOTE — Progress Notes (Signed)
Patient ID: Jerry Ruiz                 DOB: 1950/12/10                      MRN: [id number]     HPI: Jerry Ruiz a 65 y.o.male who presents to HTN for follow up, originally referred byScott Kathlen Mody, PA. PMH is significant for PVCs, HTN, and pericardial cyst noted on prior CT scan in 2013. Echocardiograms have not demonstrated pericardial cyst. Last echo 12/15 showed LVEF 55-60%. At last pharmacy visit on 9/19, patient was at goal <140/71mmHg on carvedilol 6.25 mg BID and  lisinopril 10mg  daily. No medication changes were made. He requested to follow-up in clinic in 2 months to give him time to increase activity/lose weight after knee surgery this week.   Pt states that his knee surgery went well, but that it has been a slow recovery. He was going to physical therapy 2x per week but this has recently ended. Reports that he has not been active, has gained weight, and has not been eating well. He is motivated to lose weight. He drinks 2-4 cups of caffeinated tea each day and has been eating a lot of biscuits. He and his wife eat most of their meals out at restaurants.  He has a home BP cuff but has not been checking his BP at all. Reports a dry cough and asks if this is from his lisinopril.   Current HTN meds: carvedilol 6.25 mg BID, lisinopril 10mg  daily  Previously tried: Toprol-XL, Hyzaar (cannot tolerate ARBs d/t aches), lisinopril (became ineffective after a few years per patient), HCTZ prn swelling BP goal: <130/90 mmHg (new HTN guidelines)  Family History: Arrhythmia, heart disease, HTN (mother); Hypertension in his father  Social History: Former smoker  Wt Readings from Last 3 Encounters:  04/16/16 231 lb 8 oz (105 kg)  03/14/16 230 lb (104.3 kg)  01/18/16 228 lb (103.4 kg)   BP Readings from Last 3 Encounters:  05/28/16 140/88  05/07/16 (!) 152/82  04/16/16 (!) 142/92   Pulse Readings from Last 3 Encounters:  05/28/16 67  05/07/16 66  04/16/16 71    Renal  function: CrCl cannot be calculated (Patient's most recent lab result is older than the maximum 21 days allowed.).  Past Medical History:  Diagnosis Date  . History of echocardiogram    a. Echo 12/15: EF 55-60%, no RWMA, mild LAE, trivial MR  . HTN (hypertension)   . PVC's (premature ventricular contractions)     Current Outpatient Prescriptions on File Prior to Visit  Medication Sig Dispense Refill  . carvedilol (COREG) 6.25 MG tablet Take 1 tablet (6.25 mg total) by mouth 2 (two) times daily. 180 tablet 3  . Cholecalciferol (VITAMIN D) 2000 UNITS CAPS Take 1 capsule by mouth daily.    Marland Kitchen FISH OIL-COENZYME Q10 PO Take 1 capsule by mouth daily. PATIENT NOT SURE OF CURRENT DOSAGE    . hydrochlorothiazide (HYDRODIURIL) 12.5 MG tablet TAKE 1 TABLET DAILY AS NEEDED FOR SWELLING  0  . lisinopril (PRINIVIL,ZESTRIL) 10 MG tablet Take 1 tablet (10 mg total) by mouth daily. 30 tablet 11  . Magnesium 250 MG TABS Take 1 tablet by mouth daily.    . Multiple Vitamin (MULTIVITAMIN) capsule Take 1 capsule by mouth daily.    [name] Leaf Extract 250 MG CAPS Take 450 mg by mouth.    . Probiotic Product (PROBIOTIC DAILY PO) Take 1  tablet by mouth daily.     No current facility-administered medications on file prior to visit.     Allergies  Allergen Reactions  . Sulfa Antibiotics Rash, Hives and Itching     Assessment/Plan:  1. Hypertension - BP has increased since last visit due to dietary indiscretion and inactivity. BP up to 158/102 today above new BP guidelines <130/11mmHg. Pt is compliant with carvedilol and lisinopril. Reports a mild dry cough with his lisinopril but states that it is not bothersome and he does not want to switch to an ARB at this time. He is hesitant to add new medications for his BP. Will optimize current regimen and increase lisinopril to 20mg  daily. He will have BMET checked in 10 days at outside [facility] lab because it is free there. Made a detailed plan to focus on lifestyle  improvements - pt will aim to go the gym 5 days a week and cut back on caffeine, sodium and processed foods. Will f/u in 1 month in HTN clinic.   Tillmon Kisling E. Zayveon Raschke, PharmD, [place] [id number] [address] [place], [place] [address] Phone: [phone]; Fax: [phone] 07/29/2016 3:36 PM

## 2016-07-29 NOTE — Patient Instructions (Addendum)
Increase lisinopril to 20mg  daily. You can take 2 of your 10mg  tablets each day.  Exercise goals: Walk for 10 minutes and ride on the stationary bike for 20 minutes 5 days a week.  Diet goals: Cut out biscuits and cut back on caffeinated tea.   Start checking your blood pressure at home.  Have lab work checked either November 30 or December 1 a [facility].  Follow up in clinic in 4 weeks on Tuesday December 19th at 2:30pm.  Call Megan in blood pressure clinic with any questions [phone].

## 2016-08-08 ENCOUNTER — Other Ambulatory Visit: Payer: Self-pay | Admitting: Interventional Cardiology

## 2016-08-08 LAB — BASIC METABOLIC PANEL
BUN: 15 mg/dL (ref 7–25)
CHLORIDE: 105 mmol/L (ref 98–110)
CO2: 26 mmol/L (ref 20–31)
CREATININE: 0.9 mg/dL (ref 0.70–1.25)
Calcium: 9.2 mg/dL (ref 8.6–10.3)
GLUCOSE: 99 mg/dL (ref 65–99)
Potassium: 4.2 mmol/L (ref 3.5–5.3)
Sodium: 140 mmol/L (ref 135–146)

## 2016-08-09 ENCOUNTER — Telehealth: Payer: Self-pay | Admitting: Pharmacist

## 2016-08-09 NOTE — Telephone Encounter (Signed)
Received BMET from outside lab after recent dose increase of lisinopril. K 4.2, SCr 0.9, both stable. Advised pt of lab results and to continue current meds. Will f/u as scheduled in HTN clinic in 2 weeks.

## 2016-08-22 ENCOUNTER — Other Ambulatory Visit: Payer: Self-pay

## 2016-08-22 ENCOUNTER — Ambulatory Visit (HOSPITAL_COMMUNITY): Payer: BLUE CROSS/BLUE SHIELD | Attending: Cardiology

## 2016-08-22 ENCOUNTER — Ambulatory Visit (INDEPENDENT_AMBULATORY_CARE_PROVIDER_SITE_OTHER): Payer: BLUE CROSS/BLUE SHIELD | Admitting: Pharmacist

## 2016-08-22 VITALS — BP 155/90 | HR 62 | Wt 232.5 lb

## 2016-08-22 DIAGNOSIS — I082 Rheumatic disorders of both aortic and tricuspid valves: Secondary | ICD-10-CM | POA: Diagnosis not present

## 2016-08-22 DIAGNOSIS — I059 Rheumatic mitral valve disease, unspecified: Secondary | ICD-10-CM | POA: Diagnosis not present

## 2016-08-22 DIAGNOSIS — I10 Essential (primary) hypertension: Secondary | ICD-10-CM | POA: Diagnosis not present

## 2016-08-22 LAB — ECHOCARDIOGRAM COMPLETE: Weight: 3720 oz

## 2016-08-22 MED ORDER — AMLODIPINE BESYLATE 5 MG PO TABS: 5.0000 mg | ORAL_TABLET | Freq: Every day | ORAL | 11 refills | Status: DC

## 2016-08-22 MED ORDER — LISINOPRIL 40 MG PO TABS: 40.0000 mg | ORAL_TABLET | Freq: Every day | ORAL | 11 refills | Status: DC

## 2016-08-22 NOTE — Progress Notes (Signed)
Patient ID: Jerry Ruiz                 DOB: 1950/11/06                      MRN: [id number]     HPI: AMR LONGS a 65 y.o.male who presents to HTN for follow up, originally referred byScott Kathlen Mody, PA. PMH is significant for PVCs, HTN, and pericardial cyst noted on prior CT scan in 2013. Echocardiograms have not demonstrated pericardial cyst. Last echo 12/15 showed LVEF 55-60%. Pt recently had knee surgery and BP increased after due to inactivity and dietary indiscretion. Lisinopril was increased to 20mg  at last visit. F/u BMET at [facility] lab was stable with K 4.2 and SCr 0.9.  Pt started going back to the gym. He will walk for 20 minutes and then bike for 20 minutes. He is still not eating well and knows this. His knee has been feeling better after his surgery. Still has a slight cough that may be related to his lisinopril but he does not want to change medications.  Current HTN meds: carvedilol 6.25 mg BID, lisinopril 20mg  daily  Previously tried: Toprol-XL, Hyzaar (cannot tolerate ARBs d/t aches), lisinopril (became ineffective after a few years per patient), HCTZ prn swelling BP goal: <130/80 mmHg (new HTN guidelines)  Family History: Arrhythmia, heart disease, HTN (mother); Hypertension in his father  Social History: Former smoker  Wt Readings from Last 3 Encounters:  07/29/16 230 lb 4 oz (104.4 kg)  04/16/16 231 lb 8 oz (105 kg)  03/14/16 230 lb (104.3 kg)   BP Readings from Last 3 Encounters:  07/29/16 (!) 158/102  05/28/16 140/88  05/07/16 (!) 152/82   Pulse Readings from Last 3 Encounters:  07/29/16 73  05/28/16 67  05/07/16 66    Renal function: CrCl cannot be calculated (Unknown ideal weight.).  Past Medical History:  Diagnosis Date  . History of echocardiogram    a. Echo 12/15: EF 55-60%, no RWMA, mild LAE, trivial MR  . HTN (hypertension)   . PVC's (premature ventricular contractions)     Current Outpatient Prescriptions on File Prior to Visit    Medication Sig Dispense Refill  . carvedilol (COREG) 6.25 MG tablet Take 1 tablet (6.25 mg total) by mouth 2 (two) times daily. 180 tablet 3  . Cholecalciferol (VITAMIN D) 2000 UNITS CAPS Take 1 capsule by mouth daily.    Marland Kitchen FISH OIL-COENZYME Q10 PO Take 1 capsule by mouth daily. PATIENT NOT SURE OF CURRENT DOSAGE    . lisinopril (PRINIVIL,ZESTRIL) 20 MG tablet Take 1 tablet (20 mg total) by mouth daily. 30 tablet 11  . Magnesium 250 MG TABS Take 1 tablet by mouth daily.    . Multiple Vitamin (MULTIVITAMIN) capsule Take 1 capsule by mouth daily.    [name] Leaf Extract 250 MG CAPS Take 450 mg by mouth.    . Probiotic Product (PROBIOTIC DAILY PO) Take 1 tablet by mouth daily.     No current facility-administered medications on file prior to visit.     Allergies  Allergen Reactions  . Sulfa Antibiotics Rash, Hives and Itching     Assessment/Plan:  1. Hypertension - BP still above goal <130/54mmHg. Will increase lisinopril to 40mg  daily and start amlodipine 5mg  daily, no changes to carvedilol dose since HR is in the low 60s. Pt will work on dietary improvements. F/u in HTN clinic in 3 weeks for BMET and BP check.  Quanesha Klimaszewski E. Mrytle Bento, PharmD, CPP, [place] [id number] [address] [place], [place] [address] Phone: [phone]; Fax: [phone] 08/22/2016 3:09 PM

## 2016-08-22 NOTE — Patient Instructions (Signed)
Increase your lisinopril to 40mg  daily.  Start taking amlodipine 5mg  daily.  Continue carvedilol 6.25mg  twice daily.  Focus on healthy eating.  Recheck blood pressure in 3 weeks.

## 2016-08-23 ENCOUNTER — Telehealth: Payer: Self-pay | Admitting: *Deleted

## 2016-08-23 ENCOUNTER — Encounter: Payer: Self-pay | Admitting: Physician Assistant

## 2016-08-23 ENCOUNTER — Encounter: Payer: Self-pay | Admitting: *Deleted

## 2016-08-23 NOTE — Telephone Encounter (Signed)
This encounter was created in error - please disregard.

## 2016-08-23 NOTE — Telephone Encounter (Signed)
Pt notified of echo results and findings by phone with verbal understanding. 

## 2016-08-27 ENCOUNTER — Ambulatory Visit: Payer: BLUE CROSS/BLUE SHIELD

## 2016-09-13 ENCOUNTER — Ambulatory Visit: Payer: BLUE CROSS/BLUE SHIELD

## 2016-09-13 NOTE — Progress Notes (Deleted)
Patient ID: Jerry Ruiz                 DOB: [birth year]                      MRN: [id number]     HPI: Jerry Ruiz a 66 y.o.male who presents to HTN for follow up, originally referred byScott Kathlen Mody, PA. PMH is significant for PVCs, HTN, and pericardial cyst noted on prior CT scan in 2013. Echocardiograms have not demonstrated pericardial cyst. Last echo 12/15 showed LVEF 55-60%. Pt recently had knee surgery and BP increased after due to inactivity and dietary indiscretion. Lisinopril was increased to 40mg  and amlodipine 5mg  were started at last visit. Pt presents today for follow up.  Pt started going back to the gym. He will walk for 20 minutes and then bike for 20 minutes. He is still not eating well and knows this. His knee has been feeling better after his surgery. Still has a slight cough that may be related to his lisinopril but he does not want to change medications.  Current HTN meds:carvedilol 6.25 mg BID, lisinopril 40mg  daily, amlodipine 5mg  Previously tried:Toprol-XL, Hyzaar (cannot tolerate ARBs d/t aches), lisinopril (became ineffective after a few years per patient), HCTZ prn swelling BP goal: <130/80 mmHg (new HTN guidelines)  Family History: Arrhythmia, heart disease, HTN (mother); Hypertension in his father  Social History: Former smoker  Wt Readings from Last 3 Encounters:  08/22/16 232 lb 8 oz (105.5 kg)  07/29/16 230 lb 4 oz (104.4 kg)  04/16/16 231 lb 8 oz (105 kg)   BP Readings from Last 3 Encounters:  08/22/16 (!) 155/90  07/29/16 (!) 158/102  05/28/16 140/88   Pulse Readings from Last 3 Encounters:  08/22/16 62  07/29/16 73  05/28/16 67    Renal function: CrCl cannot be calculated (Patient's most recent lab result is older than the maximum 21 days allowed.).  Past Medical History:  Diagnosis Date  . History of echocardiogram    a. Echo 12/15: EF 55-60%, no RWMA, mild LAE, trivial MR  . History of echocardiogram    Echo 12/17: mod conc LVH,  EF 55-60, no RWMA, Gr 1 DD, mild AI, calcified AV leaflets, trivial MR, mod LAE, normal RVSF, mild TR, no pericardial eff  . HTN (hypertension)   . PVC's (premature ventricular contractions)     Current Outpatient Prescriptions on File Prior to Visit  Medication Sig Dispense Refill  . amLODipine (NORVASC) 5 MG tablet Take 1 tablet (5 mg total) by mouth daily. 30 tablet 11  . carvedilol (COREG) 6.25 MG tablet Take 1 tablet (6.25 mg total) by mouth 2 (two) times daily. 180 tablet 3  . Cholecalciferol (VITAMIN D) 2000 UNITS CAPS Take 1 capsule by mouth daily.    Marland Kitchen FISH OIL-COENZYME Q10 PO Take 1 capsule by mouth daily. PATIENT NOT SURE OF CURRENT DOSAGE    . lisinopril (PRINIVIL,ZESTRIL) 40 MG tablet Take 1 tablet (40 mg total) by mouth daily. 30 tablet 11  . Magnesium 250 MG TABS Take 1 tablet by mouth daily.    . Multiple Vitamin (MULTIVITAMIN) capsule Take 1 capsule by mouth daily.    [name] Leaf Extract 250 MG CAPS Take 450 mg by mouth.    . Probiotic Product (PROBIOTIC DAILY PO) Take 1 tablet by mouth daily.     No current facility-administered medications on file prior to visit.     Allergies  Allergen Reactions  .  Sulfa Antibiotics Rash, Hives and Itching     Assessment/Plan:

## 2016-10-09 ENCOUNTER — Other Ambulatory Visit: Payer: Self-pay | Admitting: Internal Medicine

## 2016-10-09 ENCOUNTER — Ambulatory Visit
Admission: RE | Admit: 2016-10-09 | Discharge: 2016-10-09 | Disposition: A | Discharge status: Home / Self Care | Payer: BLUE CROSS/BLUE SHIELD | Source: Ambulatory Visit | Attending: Internal Medicine | Admitting: Internal Medicine

## 2016-10-09 DIAGNOSIS — R05 Cough: Secondary | ICD-10-CM

## 2016-10-09 DIAGNOSIS — R058 Other specified cough: Secondary | ICD-10-CM

## 2016-10-15 ENCOUNTER — Other Ambulatory Visit: Payer: Self-pay | Admitting: *Deleted

## 2016-10-15 MED ORDER — AMLODIPINE BESYLATE 5 MG PO TABS: 5.0000 mg | ORAL_TABLET | Freq: Every day | ORAL | 3 refills | Status: DC

## 2016-10-15 MED ORDER — LISINOPRIL 40 MG PO TABS: 40.0000 mg | ORAL_TABLET | Freq: Every day | ORAL | 3 refills | Status: DC

## 2017-04-16 DIAGNOSIS — Z6833 Body mass index (BMI) 33.0-33.9, adult: Secondary | ICD-10-CM | POA: Diagnosis not present

## 2017-04-16 DIAGNOSIS — N529 Male erectile dysfunction, unspecified: Secondary | ICD-10-CM | POA: Diagnosis not present

## 2017-04-16 DIAGNOSIS — R51 Headache: Secondary | ICD-10-CM | POA: Diagnosis not present

## 2017-04-16 DIAGNOSIS — G4733 Obstructive sleep apnea (adult) (pediatric): Secondary | ICD-10-CM | POA: Diagnosis not present

## 2017-04-16 DIAGNOSIS — I10 Essential (primary) hypertension: Secondary | ICD-10-CM | POA: Diagnosis not present

## 2017-04-16 DIAGNOSIS — E669 Obesity, unspecified: Secondary | ICD-10-CM | POA: Diagnosis not present

## 2017-04-29 DIAGNOSIS — C44319 Basal cell carcinoma of skin of other parts of face: Secondary | ICD-10-CM | POA: Diagnosis not present

## 2017-04-29 DIAGNOSIS — Z85828 Personal history of other malignant neoplasm of skin: Secondary | ICD-10-CM | POA: Diagnosis not present

## 2017-05-20 DIAGNOSIS — I10 Essential (primary) hypertension: Secondary | ICD-10-CM | POA: Diagnosis not present

## 2017-05-20 DIAGNOSIS — Z79899 Other long term (current) drug therapy: Secondary | ICD-10-CM | POA: Diagnosis not present

## 2017-05-20 DIAGNOSIS — R739 Hyperglycemia, unspecified: Secondary | ICD-10-CM | POA: Diagnosis not present

## 2017-05-20 DIAGNOSIS — Z6832 Body mass index (BMI) 32.0-32.9, adult: Secondary | ICD-10-CM | POA: Diagnosis not present

## 2017-05-20 DIAGNOSIS — E669 Obesity, unspecified: Secondary | ICD-10-CM | POA: Diagnosis not present

## 2017-06-08 ENCOUNTER — Other Ambulatory Visit: Payer: Self-pay | Admitting: Physician Assistant

## 2017-06-08 DIAGNOSIS — I10 Essential (primary) hypertension: Secondary | ICD-10-CM

## 2017-06-10 DIAGNOSIS — Z79899 Other long term (current) drug therapy: Secondary | ICD-10-CM | POA: Diagnosis not present

## 2017-06-10 DIAGNOSIS — R7301 Impaired fasting glucose: Secondary | ICD-10-CM | POA: Diagnosis not present

## 2017-06-13 DIAGNOSIS — I10 Essential (primary) hypertension: Secondary | ICD-10-CM | POA: Diagnosis not present

## 2017-06-13 DIAGNOSIS — R739 Hyperglycemia, unspecified: Secondary | ICD-10-CM | POA: Diagnosis not present

## 2017-06-13 DIAGNOSIS — E669 Obesity, unspecified: Secondary | ICD-10-CM | POA: Diagnosis not present

## 2017-07-23 DIAGNOSIS — I10 Essential (primary) hypertension: Secondary | ICD-10-CM | POA: Diagnosis not present

## 2017-07-23 DIAGNOSIS — R252 Cramp and spasm: Secondary | ICD-10-CM | POA: Diagnosis not present

## 2017-07-23 DIAGNOSIS — R7303 Prediabetes: Secondary | ICD-10-CM | POA: Diagnosis not present

## 2017-10-01 DIAGNOSIS — Z85828 Personal history of other malignant neoplasm of skin: Secondary | ICD-10-CM | POA: Diagnosis not present

## 2017-10-01 DIAGNOSIS — C44319 Basal cell carcinoma of skin of other parts of face: Secondary | ICD-10-CM | POA: Diagnosis not present

## 2017-10-08 DIAGNOSIS — R7303 Prediabetes: Secondary | ICD-10-CM | POA: Diagnosis not present

## 2017-10-08 DIAGNOSIS — I10 Essential (primary) hypertension: Secondary | ICD-10-CM | POA: Diagnosis not present

## 2017-10-08 DIAGNOSIS — E669 Obesity, unspecified: Secondary | ICD-10-CM | POA: Diagnosis not present

## 2017-10-08 DIAGNOSIS — R05 Cough: Secondary | ICD-10-CM | POA: Diagnosis not present

## 2017-10-08 DIAGNOSIS — Z6832 Body mass index (BMI) 32.0-32.9, adult: Secondary | ICD-10-CM | POA: Diagnosis not present

## 2017-12-05 DIAGNOSIS — G4733 Obstructive sleep apnea (adult) (pediatric): Secondary | ICD-10-CM | POA: Diagnosis not present

## 2017-12-05 DIAGNOSIS — Z0000 Encounter for general adult medical examination without abnormal findings: Secondary | ICD-10-CM | POA: Diagnosis not present

## 2017-12-05 DIAGNOSIS — Z79899 Other long term (current) drug therapy: Secondary | ICD-10-CM | POA: Diagnosis not present

## 2017-12-05 DIAGNOSIS — I4510 Unspecified right bundle-branch block: Secondary | ICD-10-CM | POA: Diagnosis not present

## 2017-12-05 DIAGNOSIS — I10 Essential (primary) hypertension: Secondary | ICD-10-CM | POA: Diagnosis not present

## 2017-12-05 DIAGNOSIS — Z1389 Encounter for screening for other disorder: Secondary | ICD-10-CM | POA: Diagnosis not present

## 2017-12-05 DIAGNOSIS — R7303 Prediabetes: Secondary | ICD-10-CM | POA: Diagnosis not present

## 2017-12-05 DIAGNOSIS — Z23 Encounter for immunization: Secondary | ICD-10-CM | POA: Diagnosis not present

## 2017-12-05 DIAGNOSIS — E291 Testicular hypofunction: Secondary | ICD-10-CM | POA: Diagnosis not present

## 2017-12-05 DIAGNOSIS — R42 Dizziness and giddiness: Secondary | ICD-10-CM | POA: Diagnosis not present

## 2017-12-23 DIAGNOSIS — M2031 Hallux varus (acquired), right foot: Secondary | ICD-10-CM | POA: Diagnosis not present

## 2017-12-23 DIAGNOSIS — Q6621 Congenital metatarsus primus varus: Secondary | ICD-10-CM | POA: Diagnosis not present

## 2017-12-23 DIAGNOSIS — M2041 Other hammer toe(s) (acquired), right foot: Secondary | ICD-10-CM | POA: Diagnosis not present

## 2018-01-13 DIAGNOSIS — Z85820 Personal history of malignant melanoma of skin: Secondary | ICD-10-CM | POA: Diagnosis not present

## 2018-01-13 DIAGNOSIS — L821 Other seborrheic keratosis: Secondary | ICD-10-CM | POA: Diagnosis not present

## 2018-01-13 DIAGNOSIS — L738 Other specified follicular disorders: Secondary | ICD-10-CM | POA: Diagnosis not present

## 2018-01-13 DIAGNOSIS — Z85828 Personal history of other malignant neoplasm of skin: Secondary | ICD-10-CM | POA: Diagnosis not present

## 2018-01-13 DIAGNOSIS — L918 Other hypertrophic disorders of the skin: Secondary | ICD-10-CM | POA: Diagnosis not present

## 2018-01-13 DIAGNOSIS — I8392 Asymptomatic varicose veins of left lower extremity: Secondary | ICD-10-CM | POA: Diagnosis not present

## 2018-01-13 DIAGNOSIS — Z86018 Personal history of other benign neoplasm: Secondary | ICD-10-CM | POA: Diagnosis not present

## 2018-01-13 DIAGNOSIS — L578 Other skin changes due to chronic exposure to nonionizing radiation: Secondary | ICD-10-CM | POA: Diagnosis not present

## 2018-01-13 DIAGNOSIS — Z872 Personal history of diseases of the skin and subcutaneous tissue: Secondary | ICD-10-CM | POA: Diagnosis not present

## 2018-02-09 DIAGNOSIS — M542 Cervicalgia: Secondary | ICD-10-CM | POA: Diagnosis not present

## 2018-02-09 DIAGNOSIS — I10 Essential (primary) hypertension: Secondary | ICD-10-CM | POA: Diagnosis not present

## 2018-03-19 DIAGNOSIS — H2513 Age-related nuclear cataract, bilateral: Secondary | ICD-10-CM | POA: Diagnosis not present

## 2018-03-19 DIAGNOSIS — H35033 Hypertensive retinopathy, bilateral: Secondary | ICD-10-CM | POA: Diagnosis not present

## 2018-04-22 DIAGNOSIS — Z79899 Other long term (current) drug therapy: Secondary | ICD-10-CM | POA: Diagnosis not present

## 2018-04-22 DIAGNOSIS — I10 Essential (primary) hypertension: Secondary | ICD-10-CM | POA: Diagnosis not present

## 2018-06-08 ENCOUNTER — Ambulatory Visit
Admission: RE | Admit: 2018-06-08 | Discharge: 2018-06-08 | Disposition: A | Discharge status: Home / Self Care | Payer: BLUE CROSS/BLUE SHIELD | Source: Ambulatory Visit | Attending: Geriatric Medicine | Admitting: Geriatric Medicine

## 2018-06-08 ENCOUNTER — Other Ambulatory Visit: Payer: Self-pay | Admitting: Geriatric Medicine

## 2018-06-08 DIAGNOSIS — R05 Cough: Secondary | ICD-10-CM | POA: Diagnosis not present

## 2018-06-08 DIAGNOSIS — J40 Bronchitis, not specified as acute or chronic: Secondary | ICD-10-CM

## 2018-06-08 DIAGNOSIS — I10 Essential (primary) hypertension: Secondary | ICD-10-CM | POA: Diagnosis not present

## 2018-06-08 DIAGNOSIS — J209 Acute bronchitis, unspecified: Secondary | ICD-10-CM | POA: Diagnosis not present

## 2018-07-16 DIAGNOSIS — Z85820 Personal history of malignant melanoma of skin: Secondary | ICD-10-CM | POA: Diagnosis not present

## 2018-07-16 DIAGNOSIS — L821 Other seborrheic keratosis: Secondary | ICD-10-CM | POA: Diagnosis not present

## 2018-07-16 DIAGNOSIS — L817 Pigmented purpuric dermatosis: Secondary | ICD-10-CM | POA: Diagnosis not present

## 2018-07-16 DIAGNOSIS — L578 Other skin changes due to chronic exposure to nonionizing radiation: Secondary | ICD-10-CM | POA: Diagnosis not present

## 2018-07-16 DIAGNOSIS — M0630 Rheumatoid nodule, unspecified site: Secondary | ICD-10-CM | POA: Diagnosis not present

## 2018-07-16 DIAGNOSIS — Z85828 Personal history of other malignant neoplasm of skin: Secondary | ICD-10-CM | POA: Diagnosis not present

## 2018-07-16 DIAGNOSIS — L570 Actinic keratosis: Secondary | ICD-10-CM | POA: Diagnosis not present

## 2018-07-16 DIAGNOSIS — Z1283 Encounter for screening for malignant neoplasm of skin: Secondary | ICD-10-CM | POA: Diagnosis not present

## 2018-07-16 DIAGNOSIS — B078 Other viral warts: Secondary | ICD-10-CM | POA: Diagnosis not present

## 2018-07-16 DIAGNOSIS — L738 Other specified follicular disorders: Secondary | ICD-10-CM | POA: Diagnosis not present

## 2018-07-16 DIAGNOSIS — Z872 Personal history of diseases of the skin and subcutaneous tissue: Secondary | ICD-10-CM | POA: Diagnosis not present

## 2018-07-16 DIAGNOSIS — Z86018 Personal history of other benign neoplasm: Secondary | ICD-10-CM | POA: Diagnosis not present

## 2018-08-13 DIAGNOSIS — R1032 Left lower quadrant pain: Secondary | ICD-10-CM | POA: Diagnosis not present

## 2018-08-13 DIAGNOSIS — G8929 Other chronic pain: Secondary | ICD-10-CM | POA: Diagnosis not present

## 2018-08-13 DIAGNOSIS — M545 Low back pain: Secondary | ICD-10-CM | POA: Diagnosis not present

## 2018-08-13 DIAGNOSIS — R002 Palpitations: Secondary | ICD-10-CM | POA: Diagnosis not present

## 2018-08-13 DIAGNOSIS — I10 Essential (primary) hypertension: Secondary | ICD-10-CM | POA: Diagnosis not present

## 2018-08-13 DIAGNOSIS — E669 Obesity, unspecified: Secondary | ICD-10-CM | POA: Diagnosis not present

## 2018-10-14 DIAGNOSIS — M25512 Pain in left shoulder: Secondary | ICD-10-CM | POA: Diagnosis not present

## 2018-10-15 ENCOUNTER — Other Ambulatory Visit: Payer: Self-pay | Admitting: Specialist

## 2018-10-15 DIAGNOSIS — M25512 Pain in left shoulder: Secondary | ICD-10-CM

## 2018-10-19 ENCOUNTER — Ambulatory Visit
Admission: RE | Admit: 2018-10-19 | Discharge: 2018-10-19 | Disposition: A | Discharge status: Home / Self Care | Payer: PPO | Source: Ambulatory Visit | Attending: Specialist | Admitting: Specialist

## 2018-10-19 DIAGNOSIS — M25512 Pain in left shoulder: Secondary | ICD-10-CM

## 2018-10-19 DIAGNOSIS — M19012 Primary osteoarthritis, left shoulder: Secondary | ICD-10-CM | POA: Diagnosis not present

## 2018-10-22 DIAGNOSIS — M19012 Primary osteoarthritis, left shoulder: Secondary | ICD-10-CM | POA: Diagnosis not present

## 2018-10-22 DIAGNOSIS — M25512 Pain in left shoulder: Secondary | ICD-10-CM | POA: Diagnosis not present

## 2018-10-27 DIAGNOSIS — J029 Acute pharyngitis, unspecified: Secondary | ICD-10-CM | POA: Diagnosis not present

## 2018-10-27 DIAGNOSIS — N529 Male erectile dysfunction, unspecified: Secondary | ICD-10-CM | POA: Diagnosis not present

## 2018-10-27 DIAGNOSIS — I10 Essential (primary) hypertension: Secondary | ICD-10-CM | POA: Diagnosis not present

## 2018-10-27 DIAGNOSIS — E669 Obesity, unspecified: Secondary | ICD-10-CM | POA: Diagnosis not present

## 2018-10-28 DIAGNOSIS — I10 Essential (primary) hypertension: Secondary | ICD-10-CM | POA: Diagnosis not present

## 2018-10-28 DIAGNOSIS — J42 Unspecified chronic bronchitis: Secondary | ICD-10-CM | POA: Diagnosis not present

## 2018-11-02 DIAGNOSIS — I10 Essential (primary) hypertension: Secondary | ICD-10-CM | POA: Diagnosis not present

## 2018-11-02 DIAGNOSIS — H0100B Unspecified blepharitis left eye, upper and lower eyelids: Secondary | ICD-10-CM | POA: Diagnosis not present

## 2018-11-02 DIAGNOSIS — J0100 Acute maxillary sinusitis, unspecified: Secondary | ICD-10-CM | POA: Diagnosis not present

## 2018-12-03 DIAGNOSIS — I10 Essential (primary) hypertension: Secondary | ICD-10-CM | POA: Diagnosis not present

## 2018-12-03 DIAGNOSIS — J42 Unspecified chronic bronchitis: Secondary | ICD-10-CM | POA: Diagnosis not present

## 2018-12-16 DIAGNOSIS — I10 Essential (primary) hypertension: Secondary | ICD-10-CM | POA: Diagnosis not present

## 2018-12-16 DIAGNOSIS — J42 Unspecified chronic bronchitis: Secondary | ICD-10-CM | POA: Diagnosis not present

## 2018-12-25 DIAGNOSIS — I10 Essential (primary) hypertension: Secondary | ICD-10-CM | POA: Diagnosis not present

## 2018-12-25 DIAGNOSIS — R1032 Left lower quadrant pain: Secondary | ICD-10-CM | POA: Diagnosis not present

## 2018-12-28 DIAGNOSIS — R1032 Left lower quadrant pain: Secondary | ICD-10-CM | POA: Diagnosis not present

## 2018-12-30 DIAGNOSIS — R1032 Left lower quadrant pain: Secondary | ICD-10-CM | POA: Diagnosis not present

## 2019-02-17 DIAGNOSIS — L720 Epidermal cyst: Secondary | ICD-10-CM | POA: Diagnosis not present

## 2019-02-17 DIAGNOSIS — L578 Other skin changes due to chronic exposure to nonionizing radiation: Secondary | ICD-10-CM | POA: Diagnosis not present

## 2019-02-17 DIAGNOSIS — Z86018 Personal history of other benign neoplasm: Secondary | ICD-10-CM | POA: Diagnosis not present

## 2019-02-17 DIAGNOSIS — I8392 Asymptomatic varicose veins of left lower extremity: Secondary | ICD-10-CM | POA: Diagnosis not present

## 2019-02-17 DIAGNOSIS — L570 Actinic keratosis: Secondary | ICD-10-CM | POA: Diagnosis not present

## 2019-02-17 DIAGNOSIS — Z85828 Personal history of other malignant neoplasm of skin: Secondary | ICD-10-CM | POA: Diagnosis not present

## 2019-02-17 DIAGNOSIS — L821 Other seborrheic keratosis: Secondary | ICD-10-CM | POA: Diagnosis not present

## 2019-02-17 DIAGNOSIS — I8391 Asymptomatic varicose veins of right lower extremity: Secondary | ICD-10-CM | POA: Diagnosis not present

## 2019-02-17 DIAGNOSIS — Z85820 Personal history of malignant melanoma of skin: Secondary | ICD-10-CM | POA: Diagnosis not present

## 2019-04-09 DIAGNOSIS — J42 Unspecified chronic bronchitis: Secondary | ICD-10-CM | POA: Diagnosis not present

## 2019-04-09 DIAGNOSIS — I10 Essential (primary) hypertension: Secondary | ICD-10-CM | POA: Diagnosis not present

## 2019-04-20 DIAGNOSIS — I10 Essential (primary) hypertension: Secondary | ICD-10-CM | POA: Diagnosis not present

## 2019-04-20 DIAGNOSIS — K4090 Unilateral inguinal hernia, without obstruction or gangrene, not specified as recurrent: Secondary | ICD-10-CM | POA: Diagnosis not present

## 2019-04-20 DIAGNOSIS — R002 Palpitations: Secondary | ICD-10-CM | POA: Diagnosis not present

## 2019-04-26 DIAGNOSIS — R000 Tachycardia, unspecified: Secondary | ICD-10-CM | POA: Diagnosis not present

## 2019-05-07 DIAGNOSIS — J42 Unspecified chronic bronchitis: Secondary | ICD-10-CM | POA: Diagnosis not present

## 2019-05-07 DIAGNOSIS — I10 Essential (primary) hypertension: Secondary | ICD-10-CM | POA: Diagnosis not present

## 2019-05-15 DIAGNOSIS — R002 Palpitations: Secondary | ICD-10-CM | POA: Diagnosis not present

## 2019-05-24 DIAGNOSIS — I10 Essential (primary) hypertension: Secondary | ICD-10-CM | POA: Diagnosis not present

## 2019-05-24 DIAGNOSIS — R002 Palpitations: Secondary | ICD-10-CM | POA: Diagnosis not present

## 2019-05-26 DIAGNOSIS — I471 Supraventricular tachycardia: Secondary | ICD-10-CM | POA: Diagnosis not present

## 2019-05-26 DIAGNOSIS — R000 Tachycardia, unspecified: Secondary | ICD-10-CM | POA: Diagnosis not present

## 2019-06-14 DIAGNOSIS — I10 Essential (primary) hypertension: Secondary | ICD-10-CM | POA: Diagnosis not present

## 2019-06-14 DIAGNOSIS — G4733 Obstructive sleep apnea (adult) (pediatric): Secondary | ICD-10-CM | POA: Diagnosis not present

## 2019-06-14 DIAGNOSIS — E291 Testicular hypofunction: Secondary | ICD-10-CM | POA: Diagnosis not present

## 2019-07-05 DIAGNOSIS — I10 Essential (primary) hypertension: Secondary | ICD-10-CM | POA: Diagnosis not present

## 2019-07-05 DIAGNOSIS — E291 Testicular hypofunction: Secondary | ICD-10-CM | POA: Diagnosis not present

## 2019-07-05 DIAGNOSIS — Z0000 Encounter for general adult medical examination without abnormal findings: Secondary | ICD-10-CM | POA: Diagnosis not present

## 2019-07-05 DIAGNOSIS — Z125 Encounter for screening for malignant neoplasm of prostate: Secondary | ICD-10-CM | POA: Diagnosis not present

## 2019-07-05 DIAGNOSIS — Z23 Encounter for immunization: Secondary | ICD-10-CM | POA: Diagnosis not present

## 2019-07-05 DIAGNOSIS — Z79899 Other long term (current) drug therapy: Secondary | ICD-10-CM | POA: Diagnosis not present

## 2019-07-05 DIAGNOSIS — Z1389 Encounter for screening for other disorder: Secondary | ICD-10-CM | POA: Diagnosis not present

## 2019-07-05 DIAGNOSIS — R000 Tachycardia, unspecified: Secondary | ICD-10-CM | POA: Diagnosis not present

## 2019-07-05 DIAGNOSIS — G4733 Obstructive sleep apnea (adult) (pediatric): Secondary | ICD-10-CM | POA: Diagnosis not present

## 2019-07-09 DIAGNOSIS — I10 Essential (primary) hypertension: Secondary | ICD-10-CM | POA: Diagnosis not present

## 2019-07-09 DIAGNOSIS — J42 Unspecified chronic bronchitis: Secondary | ICD-10-CM | POA: Diagnosis not present

## 2019-07-12 IMAGING — MR MR SHOULDER*L* W/O CM
4 of 5 series · 28 of 40 positions shown · non-contrast
Comparison: None.

CLINICAL DATA: Left shoulder pain for 3 months.  No known injury.

EXAM:
MRI OF THE LEFT SHOULDER WITHOUT CONTRAST
TECHNIQUE: Multiplanar, multisequence MR imaging of the shoulder was performed.
No intravenous contrast was administered.

[Series 4: PD fat-sat · axial · 4.0mm · 0.29mm/px · z∈[-54,+55]mm · 8 of 24 slices shown (1 of 2)]
[im 1/24]
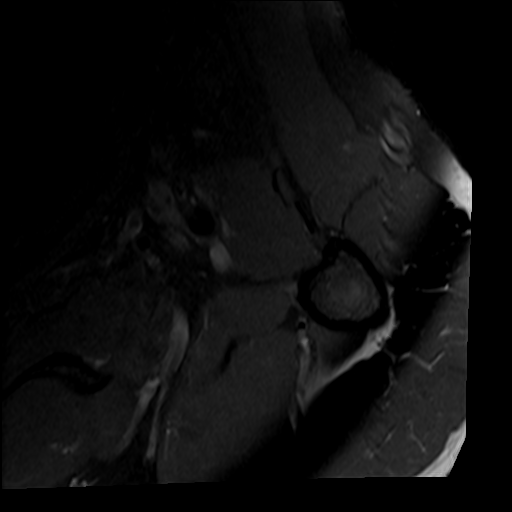
[im 3/24]
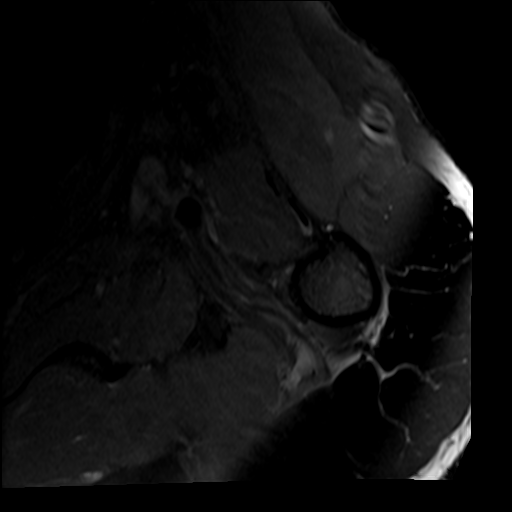
[im 8/24]
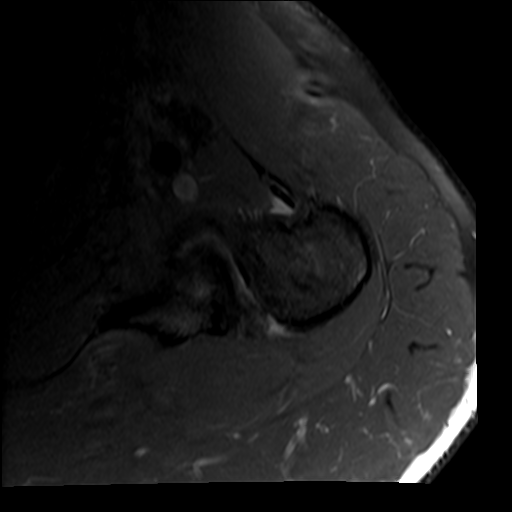
[im 11/24]
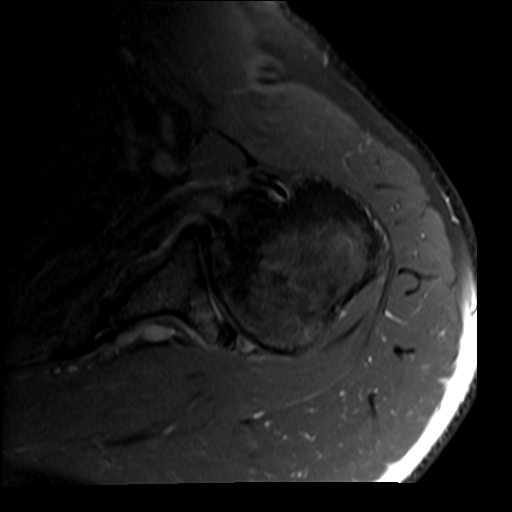
[im 13/24]
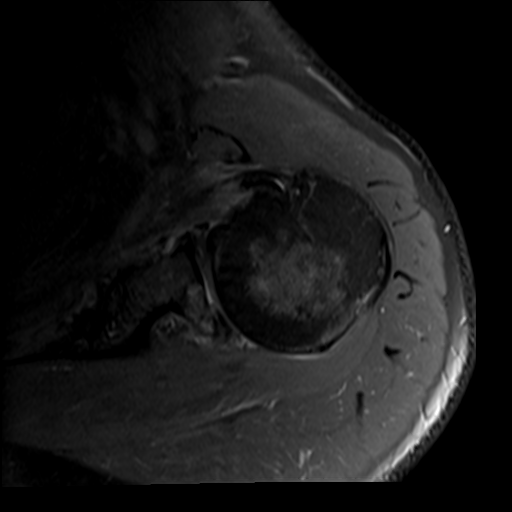
[im 16/24]
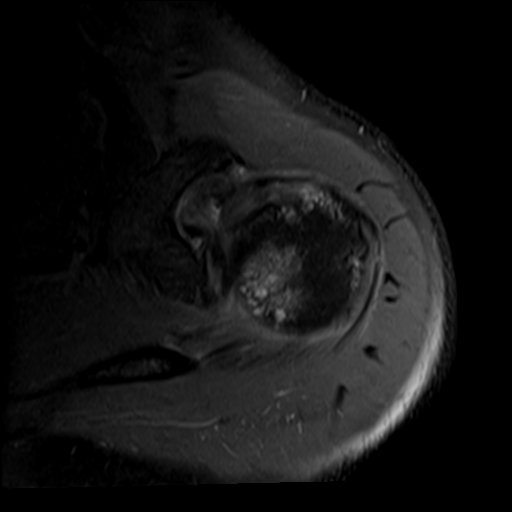
[im 21/24]
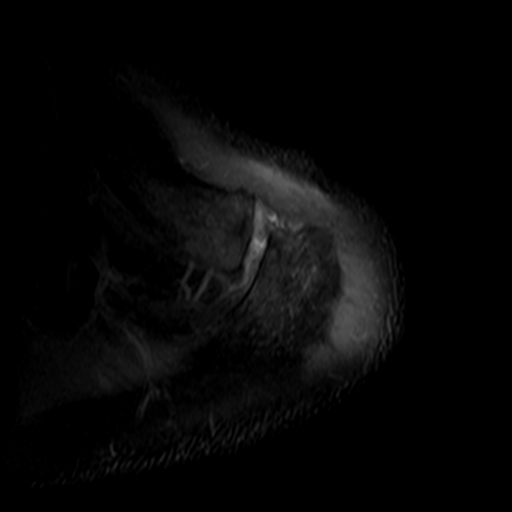
[im 24/24]
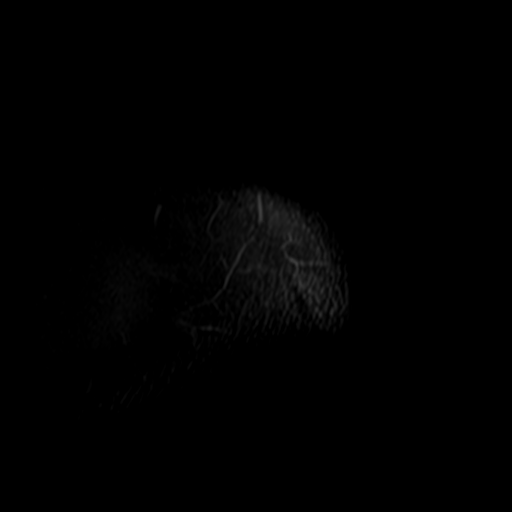

[Series 6: T2 fat-sat · oblique · 4.0mm · 0.55mm/px · 8 of 21 slices shown (1 of 2)]
[im 1/21]
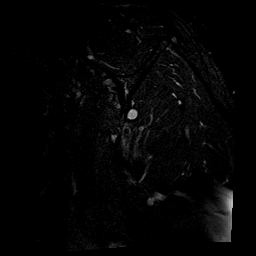
[im 3/21]
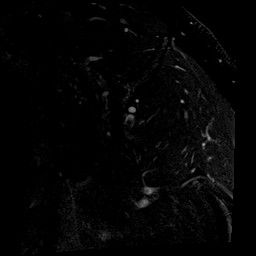
[im 6/21]
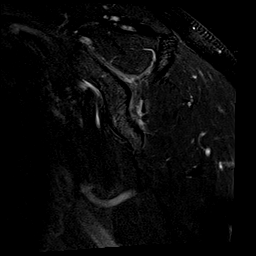
[im 9/21]
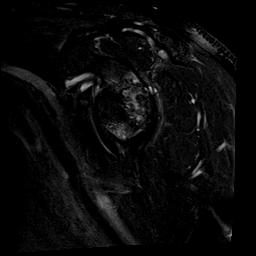
[im 12/21]
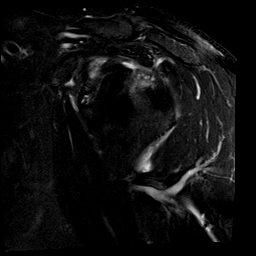
[im 15/21]
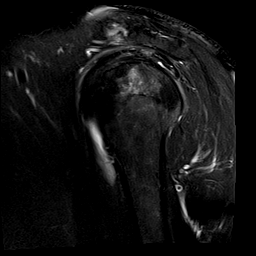
[im 18/21]
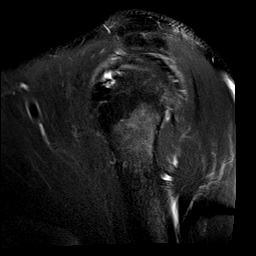
[im 21/21]
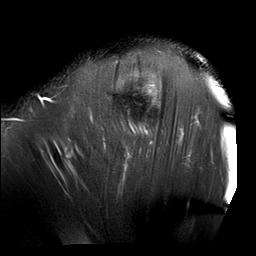

[Series 7: T2 fat-sat · oblique · 4.0mm · 0.55mm/px · 5 of 18 slices shown (2 of 2)]
[im 1/18]
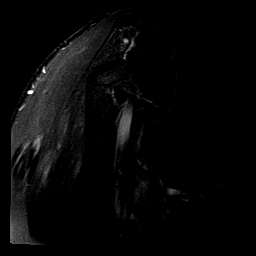
[im 3/18]
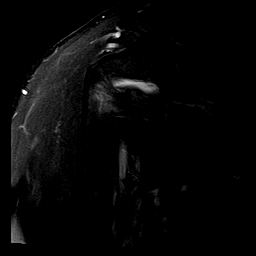
[im 6/18]
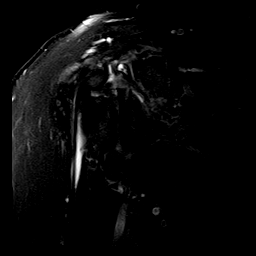
[im 9/18]
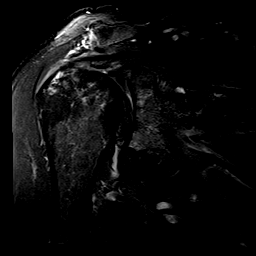
[im 15/18]
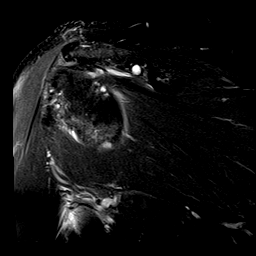

[Series 8: PD fat-sat · oblique · 4.0mm · 0.27mm/px · 7 of 18 slices shown (2 of 2)]
[im 1/18]
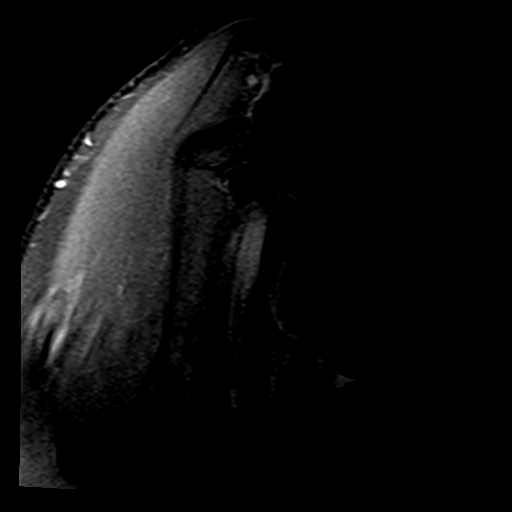
[im 3/18]
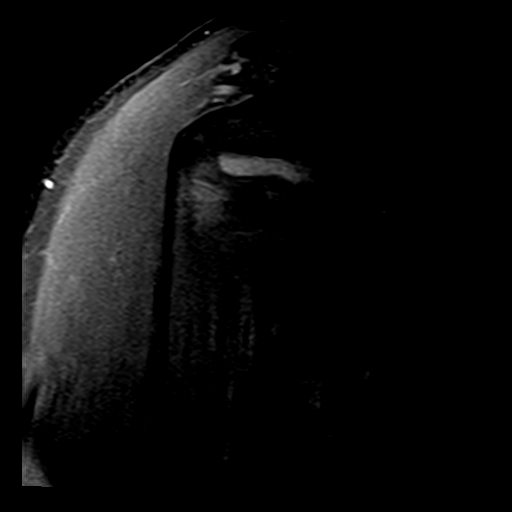
[im 6/18]
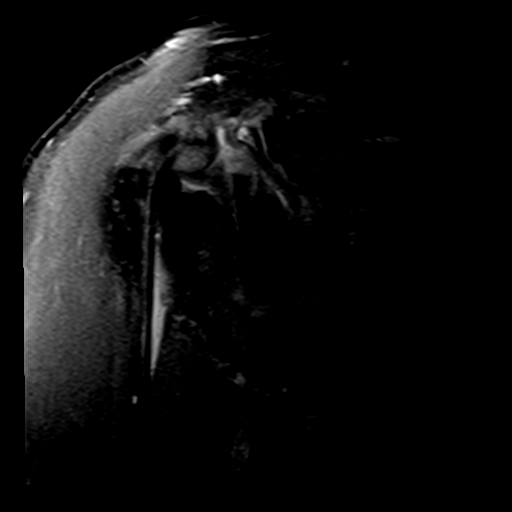
[im 9/18]
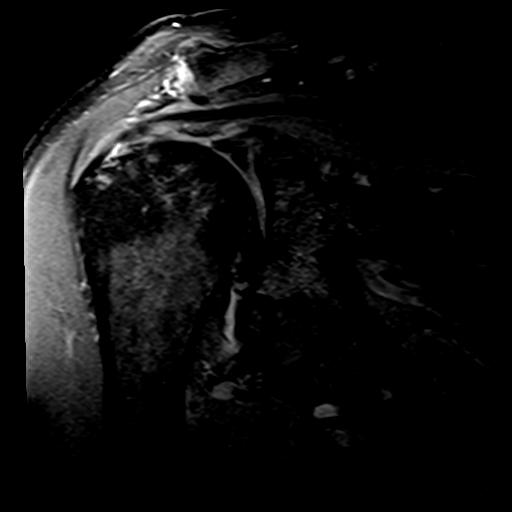
[im 12/18]
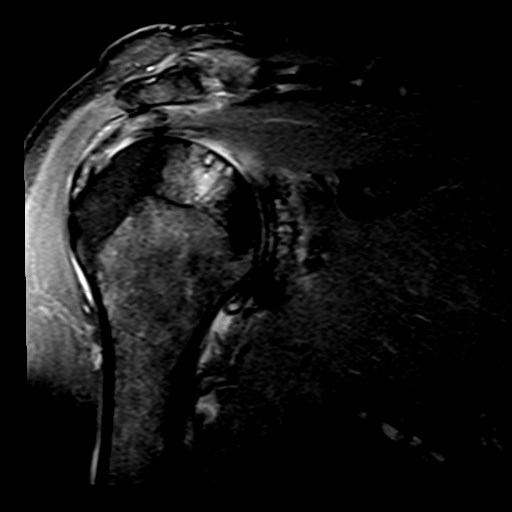
[im 15/18]
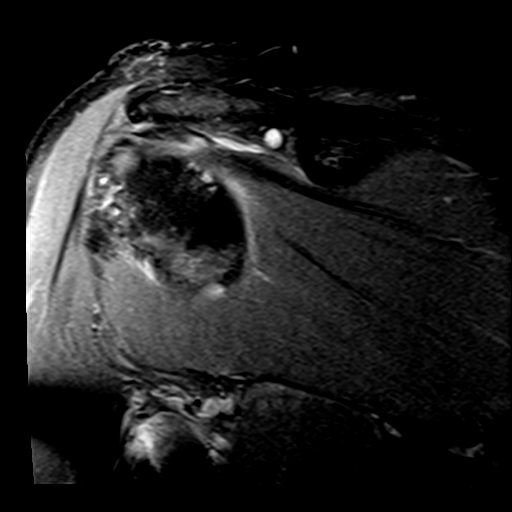
[im 18/18]
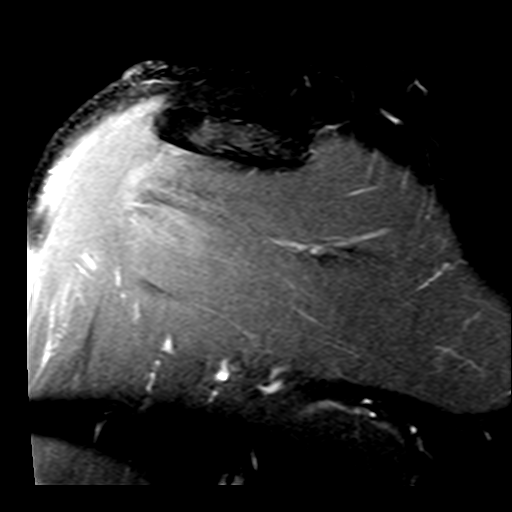

[28 of 40 positions shown; findings below may reference images not displayed]

FINDINGS: Rotator cuff: The patient has rotator cuff tendinopathy. A partial
width undersurface tear of the anterior supraspinatus measures
cm from front to back with laminar retraction of approximately 1 cm.
The rotator cuff is otherwise intact.

Muscles:  Normal without atrophy or focal lesion.

Biceps long head: Intact. Tendinopathy of the intra-articular
segment is identified.

Acromioclavicular Joint: Moderate osteoarthritis is seen. Type 1
acromion. There is some subacromial spurring. Tiny amount of fluid
is seen in the subacromial/subdeltoid bursa.

Glenohumeral Joint: The patient has advanced osteoarthritis with
extensive cartilage loss. Multiple tiny subchondral cysts are seen
in the posterior 50% of the glenoid and in the superior aspect of
the articular surface of the humeral head. Mild subchondral edema is
present about the joint.

Labrum:  The superior labrum is severely degenerated.

Bones:  No fracture or worrisome lesion.

Other: None.
IMPRESSION: Dominant finding is advanced glenohumeral osteoarthritis.

Rotator cuff tendinopathy with a 0.5 cm from front to back
undersurface tear of the anterior supraspinatus. Retraction is 1 cm.
No atrophy.

Severe appearing tendinopathy of the intra-articular long head of
biceps without tear.

Moderate acromioclavicular osteoarthritis.

## 2019-07-23 ENCOUNTER — Other Ambulatory Visit: Payer: Self-pay

## 2019-07-23 ENCOUNTER — Ambulatory Visit: Payer: PPO | Admitting: Interventional Cardiology

## 2019-07-23 ENCOUNTER — Encounter: Payer: Self-pay | Admitting: Interventional Cardiology

## 2019-07-23 ENCOUNTER — Encounter (INDEPENDENT_AMBULATORY_CARE_PROVIDER_SITE_OTHER): Payer: Self-pay

## 2019-07-23 VITALS — BP 162/90 | HR 63 | Ht 72.0 in | Wt 226.8 lb

## 2019-07-23 DIAGNOSIS — N529 Male erectile dysfunction, unspecified: Secondary | ICD-10-CM | POA: Diagnosis not present

## 2019-07-23 DIAGNOSIS — I493 Ventricular premature depolarization: Secondary | ICD-10-CM

## 2019-07-23 DIAGNOSIS — Q248 Other specified congenital malformations of heart: Secondary | ICD-10-CM

## 2019-07-23 DIAGNOSIS — R0600 Dyspnea, unspecified: Secondary | ICD-10-CM | POA: Diagnosis not present

## 2019-07-23 DIAGNOSIS — I10 Essential (primary) hypertension: Secondary | ICD-10-CM

## 2019-07-23 DIAGNOSIS — R0609 Other forms of dyspnea: Secondary | ICD-10-CM

## 2019-07-23 MED ORDER — IRBESARTAN 150 MG PO TABS: 150.0000 mg | ORAL_TABLET | Freq: Every day | ORAL | 3 refills | Status: DC

## 2019-07-23 NOTE — Progress Notes (Signed)
Cardiology Office Note   Date:  07/23/2019   ID:  Jerry Ruiz, DOB 05-Jun-1951, MRN [id number]  PCP:  Lajean Manes, MD    No chief complaint on file.  PVC/HTN  Wt Readings from Last 3 Encounters:  07/23/19 226 lb 12.8 oz (102.9 kg)  08/22/16 232 lb 8 oz (105.5 kg)  07/29/16 230 lb 4 oz (104.4 kg)       History of Present Illness: Jerry Ruiz is a 68 y.o. male  with a hx of Pericardial cyst noted on prior CT scan in 2013, HTN. Echocardiograms have not demonstrated pericardial cyst. Last echo 12/15.   He has had PVCs in the past.  BPs have been difficult to control.  He was seen in the PharmD BP clinic back in 2017, but then went to PMD for a few years after that time.  He had trouble with Hyzaar, losartan: "He notes significant side effects to Hyzaar. He was fatigued and started to notice diffuse arthralgias. His arthralgias have not really improved. However, his fatigue has improved since stopping Hyzaar. (in 2017)"  He has had Diltiazem started recently in 06/2019, and increased to 180 mg daily.   Denies : Chest pain. Dizziness. Leg edema. Nitroglycerin use. Orthopnea. Paroxysmal nocturnal dyspnea. Shortness of breath. Syncope.   He has had some episodes of PVCs.   Now off of amlodipine, per his report.    He is getting sleepy at times as well.   He is not exercising regularly.  He is eating out frequently so he needs to decrease salt intake.      Past Medical History:  Diagnosis Date  . History of echocardiogram    a. Echo 12/15: EF 55-60%, no RWMA, mild LAE, trivial MR  . History of echocardiogram    Echo 12/17: mod conc LVH, EF 55-60, no RWMA, Gr 1 DD, mild AI, calcified AV leaflets, trivial MR, mod LAE, normal RVSF, mild TR, no pericardial eff  . HTN (hypertension)   . PVC's (premature ventricular contractions)     Past Surgical History:  Procedure Laterality Date  . HEMORROIDECTOMY    . KNEE SURGERY    . SHOULDER SURGERY       Current  Outpatient Medications  Medication Sig Dispense Refill  . Cholecalciferol (VITAMIN D) 2000 UNITS CAPS Take 1 capsule by mouth daily.    Marland Kitchen diltiazem (CARDIZEM CD) 180 MG 24 hr capsule Take by mouth.    Marland Kitchen FISH OIL-COENZYME Q10 PO Take 1 capsule by mouth daily. PATIENT NOT SURE OF CURRENT DOSAGE    . Magnesium 250 MG TABS Take 1 tablet by mouth daily.    . metoprolol succinate (TOPROL-XL) 50 MG 24 hr tablet Take 75 mg by mouth 2 (two) times daily. Take 50 mg in the am and 25mg  in the pm by mouth daily.    . Multiple Vitamin (MULTIVITAMIN) capsule Take 1 capsule by mouth daily.    [name] Leaf Extract 250 MG CAPS Take 450 mg by mouth.    . Probiotic Product (PROBIOTIC DAILY PO) Take 1 tablet by mouth daily.    Marland Kitchen amLODipine (NORVASC) 5 MG tablet Take 1 tablet (5 mg total) by mouth daily. 90 tablet 3   No current facility-administered medications for this visit.     Allergies:   Amlodipine and Sulfa antibiotics    Social History:  The patient  reports that he has quit smoking. He has never used smokeless tobacco.   Family History:  The  patient's family history includes Arrhythmia in his mother; Heart disease in his mother; Hypertension in his father and mother; Parkinson's disease in his father.    ROS:  Please see the history of present illness.   Otherwise, review of systems are positive for difficult to control BP; palpitations; erectile dysfunction.   All other systems are reviewed and negative.    PHYSICAL EXAM: VS:  BP (!) 162/90   Pulse 63   Ht 6' (1.829 m)   Wt 226 lb 12.8 oz (102.9 kg)   SpO2 95%   BMI 30.76 kg/m  , BMI Body mass index is 30.76 kg/m. GEN: Well nourished, well developed, in no acute distress  HEENT: normal  Neck: no JVD, carotid bruits, or masses Cardiac: RRR; no murmurs, rubs, or gallops,no edema  Respiratory:  clear to auscultation bilaterally, normal work of breathing GI: soft, nontender, nondistended, + BS MS: no deformity or atrophy  Skin: warm and  dry, no rash Neuro:  Strength and sensation are intact Psych: euthymic mood, flat affect   EKG:   The ekg ordered today demonstrates NSR, bifascicular block, RBBB, LAFBRecent Labs: No results found for requested labs within last 8760 hours.   Lipid Panel    Component Value Date/Time   CHOL 153 05/24/2014 0927   TRIG 89 05/24/2014 0927   HDL 30 (L) 05/24/2014 0927   CHOLHDL 5.1 05/24/2014 0927   VLDL 18 05/24/2014 0927   LDLCALC 105 (H) 05/24/2014 0927     Other studies Reviewed: Additional studies/ records that were reviewed today with results demonstrating: .   ASSESSMENT AND PLAN:  1. PVC: Sx of palpitations.  Mother has AFib.  Wore heart monitor, but did not have any sustained sx during that time.  If more sustained episodes occur, will plan for repeat monitor.  2. HTN: Start irbesartan 150 mg daily.  BMet in 1 week. Sleep study pending.  Increase exercise.  Decrease salt.  Lose weight.  Hopefully, will be able to back of meds as lifestyle improves.   3. Pericardial cyst/DOE: check echo.  4. ED: Likely affected by BP meds.  5. Obesity: As above.    Current medicines are reviewed at length with the patient today.  The patient concerns regarding his medicines were addressed.  The following changes have been made:  No change  Labs/ tests ordered today include:  No orders of the defined types were placed in this encounter.   Recommend 150 minutes/week of aerobic exercise Low fat, low carb, high fiber diet recommended  Disposition:   FU in 6 months   Signed, Larae Grooms, MD  07/23/2019 10:11 AM    [facility] [place], [place], [place]  [address] Phone: [phone]; Fax: [phone]

## 2019-07-23 NOTE — Patient Instructions (Addendum)
Medication Instructions:  Your physician has recommended you make the following change in your medication:   START: irbesartan 150 mg once a day  If you need a refill on your cardiac medications before your next appointment, please call your pharmacy.   Lab work: Your physician recommends that you return for lab work in: 1-2 weeks for BMET   If you have labs (blood work) drawn today and your tests are completely normal, you will receive your results only by: Marland Kitchen MyChart Message (if you have MyChart) OR . A paper copy in the mail If you have any lab test that is abnormal or we need to change your treatment, we will call you to review the results.  Testing/Procedures: Your physician has requested that you have an echocardiogram in 1 -2 weeks (same day as labs). Echocardiography is a painless test that uses sound waves to create images of your heart. It provides your doctor with information about the size and shape of your heart and how well your heart's chambers and valves are working. This procedure takes approximately one hour. There are no restrictions for this procedure.  Follow-Up: . Follow up with Dr. Irish Lack via VIRTUAL VISIT on 08/19/19 at 9:20 AM  Any Other Special Instructions Will Be Listed Below (If Applicable).   Echocardiogram An echocardiogram is a procedure that uses painless sound waves (ultrasound) to produce an image of the heart. Images from an echocardiogram can provide important information about:  Signs of coronary artery disease (CAD).  Aneurysm detection. An aneurysm is a weak or damaged part of an artery wall that bulges out from the normal force of blood pumping through the body.  Heart size and shape. Changes in the size or shape of the heart can be associated with certain conditions, including heart failure, aneurysm, and CAD.  Heart muscle function.  Heart valve function.  Signs of a past heart attack.  Fluid buildup around the heart.  Thickening  of the heart muscle.  A tumor or infectious growth around the heart valves. Tell a health care provider about:  Any allergies you have.  All medicines you are taking, including vitamins, herbs, eye drops, creams, and over-the-counter medicines.  Any blood disorders you have.  Any surgeries you have had.  Any medical conditions you have.  Whether you are pregnant or may be pregnant. What are the risks? Generally, this is a safe procedure. However, problems may occur, including:  Allergic reaction to dye (contrast) that may be used during the procedure. What happens before the procedure? No specific preparation is needed. You may eat and drink normally. What happens during the procedure?   An IV tube may be inserted into one of your veins.  You may receive contrast through this tube. A contrast is an injection that improves the quality of the pictures from your heart.  A gel will be applied to your chest.  A wand-like tool (transducer) will be moved over your chest. The gel will help to transmit the sound waves from the transducer.  The sound waves will harmlessly bounce off of your heart to allow the heart images to be captured in real-time motion. The images will be recorded on a computer. The procedure may vary among health care providers and hospitals. What happens after the procedure?  You may return to your normal, everyday life, including diet, activities, and medicines, unless your health care provider tells you not to do that. Summary  An echocardiogram is a procedure that uses painless sound  waves (ultrasound) to produce an image of the heart.  Images from an echocardiogram can provide important information about the size and shape of your heart, heart muscle function, heart valve function, and fluid buildup around your heart.  You do not need to do anything to prepare before this procedure. You may eat and drink normally.  After the echocardiogram is completed,  you may return to your normal, everyday life, unless your health care provider tells you not to do that. This information is not intended to replace advice given to you by your health care provider. Make sure you discuss any questions you have with your health care provider. Document Released: 08/23/2000 Document Revised: 12/17/2018 Document Reviewed: 09/28/2016 Elsevier Patient Education  2020 [name].

## 2019-07-28 DIAGNOSIS — G4733 Obstructive sleep apnea (adult) (pediatric): Secondary | ICD-10-CM | POA: Diagnosis not present

## 2019-08-02 ENCOUNTER — Other Ambulatory Visit: Payer: Self-pay

## 2019-08-02 ENCOUNTER — Ambulatory Visit (HOSPITAL_COMMUNITY): Payer: PPO | Attending: Cardiovascular Disease

## 2019-08-02 ENCOUNTER — Other Ambulatory Visit: Payer: PPO | Admitting: *Deleted

## 2019-08-02 DIAGNOSIS — I10 Essential (primary) hypertension: Secondary | ICD-10-CM | POA: Diagnosis not present

## 2019-08-02 DIAGNOSIS — R0609 Other forms of dyspnea: Secondary | ICD-10-CM

## 2019-08-02 DIAGNOSIS — I493 Ventricular premature depolarization: Secondary | ICD-10-CM | POA: Diagnosis not present

## 2019-08-02 DIAGNOSIS — R0600 Dyspnea, unspecified: Secondary | ICD-10-CM | POA: Diagnosis not present

## 2019-08-02 NOTE — Progress Notes (Signed)
2D Echocardiogram has been performed. 

## 2019-08-03 LAB — BASIC METABOLIC PANEL
BUN/Creatinine Ratio: 14 (ref 10–24)
BUN: 13 mg/dL (ref 8–27)
CO2: 23 mmol/L (ref 20–29)
Calcium: 9.4 mg/dL (ref 8.6–10.2)
Chloride: 102 mmol/L (ref 96–106)
Creatinine, Ser: 0.95 mg/dL (ref 0.76–1.27)
GFR calc Af Amer: 95 mL/min/{1.73_m2} (ref >59)
GFR calc non Af Amer: 82 mL/min/{1.73_m2} (ref >59)
Glucose: 90 mg/dL (ref 65–99)
Potassium: 4.4 mmol/L (ref 3.5–5.2)
Sodium: 140 mmol/L (ref 134–144)

## 2019-08-06 DIAGNOSIS — J42 Unspecified chronic bronchitis: Secondary | ICD-10-CM | POA: Diagnosis not present

## 2019-08-06 DIAGNOSIS — I10 Essential (primary) hypertension: Secondary | ICD-10-CM | POA: Diagnosis not present

## 2019-08-18 ENCOUNTER — Emergency Department: Payer: PPO

## 2019-08-18 ENCOUNTER — Observation Stay
Admission: EM | Admit: 2019-08-18 | Discharge: 2019-08-19 | Disposition: A | Discharge status: Home / Self Care | Payer: PPO | Attending: Internal Medicine | Admitting: Internal Medicine

## 2019-08-18 ENCOUNTER — Encounter: Payer: Self-pay | Admitting: Emergency Medicine

## 2019-08-18 ENCOUNTER — Other Ambulatory Visit: Payer: Self-pay

## 2019-08-18 DIAGNOSIS — R002 Palpitations: Secondary | ICD-10-CM | POA: Insufficient documentation

## 2019-08-18 DIAGNOSIS — I471 Supraventricular tachycardia: Principal | ICD-10-CM | POA: Insufficient documentation

## 2019-08-18 DIAGNOSIS — Z79899 Other long term (current) drug therapy: Secondary | ICD-10-CM | POA: Diagnosis not present

## 2019-08-18 DIAGNOSIS — R42 Dizziness and giddiness: Secondary | ICD-10-CM | POA: Insufficient documentation

## 2019-08-18 DIAGNOSIS — R0789 Other chest pain: Secondary | ICD-10-CM | POA: Diagnosis not present

## 2019-08-18 DIAGNOSIS — Z882 Allergy status to sulfonamides status: Secondary | ICD-10-CM | POA: Insufficient documentation

## 2019-08-18 DIAGNOSIS — I493 Ventricular premature depolarization: Secondary | ICD-10-CM | POA: Diagnosis not present

## 2019-08-18 DIAGNOSIS — Z888 Allergy status to other drugs, medicaments and biological substances status: Secondary | ICD-10-CM | POA: Insufficient documentation

## 2019-08-18 DIAGNOSIS — R531 Weakness: Secondary | ICD-10-CM | POA: Diagnosis not present

## 2019-08-18 DIAGNOSIS — Z820 Family history of epilepsy and other diseases of the nervous system: Secondary | ICD-10-CM | POA: Diagnosis not present

## 2019-08-18 DIAGNOSIS — Z87891 Personal history of nicotine dependence: Secondary | ICD-10-CM | POA: Insufficient documentation

## 2019-08-18 DIAGNOSIS — R0681 Apnea, not elsewhere classified: Secondary | ICD-10-CM | POA: Diagnosis not present

## 2019-08-18 DIAGNOSIS — Z8249 Family history of ischemic heart disease and other diseases of the circulatory system: Secondary | ICD-10-CM | POA: Diagnosis not present

## 2019-08-18 DIAGNOSIS — I4510 Unspecified right bundle-branch block: Secondary | ICD-10-CM | POA: Diagnosis not present

## 2019-08-18 DIAGNOSIS — R0602 Shortness of breath: Secondary | ICD-10-CM | POA: Insufficient documentation

## 2019-08-18 DIAGNOSIS — I10 Essential (primary) hypertension: Secondary | ICD-10-CM | POA: Diagnosis not present

## 2019-08-18 DIAGNOSIS — R079 Chest pain, unspecified: Secondary | ICD-10-CM

## 2019-08-18 DIAGNOSIS — Z7901 Long term (current) use of anticoagulants: Secondary | ICD-10-CM | POA: Insufficient documentation

## 2019-08-18 DIAGNOSIS — Z20828 Contact with and (suspected) exposure to other viral communicable diseases: Secondary | ICD-10-CM | POA: Insufficient documentation

## 2019-08-18 DIAGNOSIS — R000 Tachycardia, unspecified: Secondary | ICD-10-CM | POA: Diagnosis not present

## 2019-08-18 LAB — CBC
HCT: 50.8 % (ref 39.0–52.0)
HCT: 50.9 % (ref 39.0–52.0)
Hemoglobin: 17.5 g/dL — ABNORMAL HIGH (ref 13.0–17.0)
Hemoglobin: 17.2 g/dL — ABNORMAL HIGH (ref 13.0–17.0)
MCH: 28.8 pg (ref 26.0–34.0)
MCH: 28.6 pg (ref 26.0–34.0)
MCHC: 34.4 g/dL (ref 30.0–36.0)
MCHC: 33.8 g/dL (ref 30.0–36.0)
MCV: 83.6 fL (ref 80.0–100.0)
MCV: 84.7 fL (ref 80.0–100.0)
Platelets: 283 10*3/uL (ref 150–400)
Platelets: 292 10*3/uL (ref 150–400)
RBC: 6.08 MIL/uL — ABNORMAL HIGH (ref 4.22–5.81)
RBC: 6.01 MIL/uL — ABNORMAL HIGH (ref 4.22–5.81)
RDW: 13.8 % (ref 11.5–15.5)
RDW: 14 % (ref 11.5–15.5)
WBC: 12.1 10*3/uL — ABNORMAL HIGH (ref 4.0–10.5)
WBC: 10.9 10*3/uL — ABNORMAL HIGH (ref 4.0–10.5)
nRBC: 0 % (ref 0.0–0.2)
nRBC: 0 % (ref 0.0–0.2)

## 2019-08-18 LAB — BASIC METABOLIC PANEL
Anion gap: 12 (ref 5–15)
BUN: 15 mg/dL (ref 8–23)
CO2: 24 mmol/L (ref 22–32)
Calcium: 9.2 mg/dL (ref 8.9–10.3)
Chloride: 103 mmol/L (ref 98–111)
Creatinine, Ser: 1.08 mg/dL (ref 0.61–1.24)
GFR calc Af Amer: >60 mL/min (ref >60)
GFR calc non Af Amer: >60 mL/min (ref >60)
Glucose, Bld: 115 mg/dL — ABNORMAL HIGH (ref 70–99)
Potassium: 3.9 mmol/L (ref 3.5–5.1)
Sodium: 139 mmol/L (ref 135–145)

## 2019-08-18 LAB — CREATININE, SERUM
Creatinine, Ser: 1.3 mg/dL — ABNORMAL HIGH (ref 0.61–1.24)
GFR calc Af Amer: >60 mL/min (ref >60)
GFR calc non Af Amer: 56 mL/min — ABNORMAL LOW (ref >60)

## 2019-08-18 LAB — HIV ANTIBODY (ROUTINE TESTING W REFLEX): HIV Screen 4th Generation wRfx: NONREACTIVE

## 2019-08-18 LAB — MAGNESIUM: Magnesium: 2.1 mg/dL (ref 1.7–2.4)

## 2019-08-18 LAB — TROPONIN I (HIGH SENSITIVITY)
Troponin I (High Sensitivity): 17 ng/L (ref <18)
Troponin I (High Sensitivity): 24 ng/L — ABNORMAL HIGH (ref <18)

## 2019-08-18 LAB — TSH: TSH: 2.055 u[IU]/mL (ref 0.350–4.500)

## 2019-08-18 LAB — BRAIN NATRIURETIC PEPTIDE: B Natriuretic Peptide: 66 pg/mL (ref 0.0–100.0)

## 2019-08-18 MED ORDER — METOPROLOL TARTRATE 25 MG PO TABS
25.0000 mg | ORAL_TABLET | Freq: Two times a day (BID) | ORAL | Status: DC
  Administered 2019-08-18 – 2019-08-19 (×2): 25 mg via ORAL
  Filled 2019-08-18: qty 1
  Filled 2019-08-18: qty 0.5
  Filled 2019-08-18: qty 1
  Filled 2019-08-18: qty 0.5
  Filled 2019-08-18: qty 1

## 2019-08-18 MED ORDER — ACETAMINOPHEN 650 MG RE SUPP: 650.0000 mg | Freq: Four times a day (QID) | RECTAL | Status: DC | PRN

## 2019-08-18 MED ORDER — ONDANSETRON HCL 4 MG/2ML IJ SOLN: 4.0000 mg | Freq: Four times a day (QID) | INTRAMUSCULAR | Status: DC | PRN

## 2019-08-18 MED ORDER — ONDANSETRON HCL 4 MG PO TABS
4.0000 mg | ORAL_TABLET | Freq: Four times a day (QID) | ORAL | Status: DC | PRN
  Filled 2019-08-18: qty 1

## 2019-08-18 MED ORDER — AMIODARONE IV BOLUS ONLY 150 MG/100ML: 150.0000 mg | Freq: Once | INTRAVENOUS | Status: AC

## 2019-08-18 MED ORDER — ADENOSINE 6 MG/2ML IV SOLN
INTRAVENOUS | Status: AC
  Administered 2019-08-18: 11:00:00
  Filled 2019-08-18: qty 2

## 2019-08-18 MED ORDER — AMIODARONE IV BOLUS ONLY 150 MG/100ML
INTRAVENOUS | Status: AC
  Administered 2019-08-18: 150 mg via INTRAVENOUS
  Filled 2019-08-18: qty 100

## 2019-08-18 MED ORDER — DILTIAZEM HCL 25 MG/5ML IV SOLN
INTRAVENOUS | Status: AC
  Administered 2019-08-18: 11:00:00
  Filled 2019-08-18: qty 5

## 2019-08-18 MED ORDER — LACTATED RINGERS IV SOLN
INTRAVENOUS | Status: DC
  Administered 2019-08-18 (×2): via INTRAVENOUS

## 2019-08-18 MED ORDER — ADENOSINE 12 MG/4ML IV SOLN
INTRAVENOUS | Status: AC
  Administered 2019-08-18: 11:00:00
  Filled 2019-08-18: qty 4

## 2019-08-18 MED ORDER — IRBESARTAN 150 MG PO TABS
150.0000 mg | ORAL_TABLET | Freq: Every day | ORAL | Status: DC
  Administered 2019-08-19: 150 mg via ORAL
  Filled 2019-08-18 (×3): qty 1

## 2019-08-18 MED ORDER — METOPROLOL TARTRATE 50 MG PO TABS: 50.0000 mg | ORAL_TABLET | Freq: Two times a day (BID) | ORAL | Status: DC

## 2019-08-18 MED ORDER — ACETAMINOPHEN 325 MG PO TABS: 650.0000 mg | ORAL_TABLET | Freq: Four times a day (QID) | ORAL | Status: DC | PRN

## 2019-08-18 MED ORDER — SODIUM CHLORIDE 0.9% FLUSH
3.0000 mL | Freq: Once | INTRAVENOUS | Status: AC
  Administered 2019-08-18: 11:00:00 3 mL via INTRAVENOUS

## 2019-08-18 MED ORDER — AMIODARONE HCL 200 MG PO TABS
400.0000 mg | ORAL_TABLET | Freq: Two times a day (BID) | ORAL | Status: DC
  Administered 2019-08-18 – 2019-08-19 (×3): 400 mg via ORAL
  Filled 2019-08-18 (×6): qty 2

## 2019-08-18 MED ORDER — ENOXAPARIN SODIUM 40 MG/0.4ML ~~LOC~~ SOLN
40.0000 mg | SUBCUTANEOUS | Status: DC
  Administered 2019-08-18: 40 mg via SUBCUTANEOUS
  Filled 2019-08-18: qty 0.4

## 2019-08-18 NOTE — ED Notes (Signed)
Pt CP/SHOB/diziness at this time.

## 2019-08-18 NOTE — ED Notes (Addendum)
Pt in a tachycardic rhythm, states he has felt this way since awakening at 8AM today. Denies SOB or CP. Color WNL, RR even and unlabored, alert and oriented X 4. Hx frequent PVC's since 1979 per patient.

## 2019-08-18 NOTE — ED Notes (Signed)
Pt ambulated to toilet with a steady gait at this time. This RN in room monitoring pt.

## 2019-08-18 NOTE — ED Notes (Signed)
ED Provider at bedside. 

## 2019-08-18 NOTE — H&P (Signed)
History and Physical    Jerry Ruiz [id number] DOB: 1951/05/05 DOA: 08/18/2019  PCP: Lajean Manes, MD  Patient coming from: Home  I have personally briefly reviewed patient's old medical records in [place]  Chief Complaint: Palpitations  HPI: Jerry Ruiz is a 68 y.o. male with medical history significant of hypertension, palpitations.  Reports having a longstanding history of palpitations, but has been worse over the last 3 months.  This morning, he woke up with persistent palpitations.  Denies any chest pain or shortness of breath.  He did feel somewhat lightheaded and dizzy.  My symptoms did not spontaneously resolve, presents to the emergency for evaluation.  On arrival to the emergency room he was noted to be in SVT with a heart rate in the 170s.  Cardiac enzymes are noted to be negative.  Chest x-ray was unrevealing.  Basic labs also unrevealing.  No signs of infection.  He was seen by cardiology in the emergency room and received a dose of IV amiodarone.  He subsequently converted back to sinus rhythm.  Further medications were adjusted in the emergency room he is being admitted for observation.  Review of Systems:  General: Negative for fever, weight loss, generalized weakness Respiratory: Negative for cough, shortness of breath, wheezing Cardiac: Positive for palpitations, negative for chest pain GI: Negative for nausea, vomiting, diarrhea All other systems reviewed and found to be negative.   Past Medical History:  Diagnosis Date  . History of echocardiogram    a. Echo 12/15: EF 55-60%, no RWMA, mild LAE, trivial MR  . History of echocardiogram    Echo 12/17: mod conc LVH, EF 55-60, no RWMA, Gr 1 DD, mild AI, calcified AV leaflets, trivial MR, mod LAE, normal RVSF, mild TR, no pericardial eff  . HTN (hypertension)   . PVC's (premature ventricular contractions)     Past Surgical History:  Procedure Laterality Date  . HEMORROIDECTOMY    . KNEE SURGERY    .  SHOULDER SURGERY      Social History:  reports that he has quit smoking. He has never used smokeless tobacco. No history on file for alcohol and drug.  Allergies  Allergen Reactions  . Amlodipine Other (See Comments)  . Sulfa Antibiotics Rash, Hives and Itching    Family History  Problem Relation Age of Onset  . Arrhythmia Mother   . Hypertension Mother   . Heart disease Mother   . Parkinson's disease Father   . Hypertension Father      Prior to Admission medications   Medication Sig Start Date End Date Taking? Authorizing Provider  amLODipine (NORVASC) 5 MG tablet Take 1 tablet (5 mg total) by mouth daily. 10/15/16 01/13/17  [name] T, PA-C  Cholecalciferol (VITAMIN D) 2000 UNITS CAPS Take 1 capsule by mouth daily.    [provider]  diltiazem (CARDIZEM CD) 180 MG 24 hr capsule Take by mouth.    [provider]  FISH OIL-COENZYME Q10 PO Take 1 capsule by mouth daily. PATIENT NOT SURE OF CURRENT DOSAGE    [provider]  irbesartan (AVAPRO) 150 MG tablet Take 1 tablet (150 mg total) by mouth daily. 07/23/19   Jettie Booze, MD  Magnesium 250 MG TABS Take 1 tablet by mouth daily.    [provider]  metoprolol succinate (TOPROL-XL) 50 MG 24 hr tablet Take 75 mg by mouth 2 (two) times daily. Take 50 mg in the am and 25mg  in the pm by mouth  daily. 07/23/19   [provider]  Multiple Vitamin (MULTIVITAMIN) capsule Take 1 capsule by mouth daily.    [provider]  Olive Leaf Extract 250 MG CAPS Take 450 mg by mouth.    [provider]  Probiotic Product (PROBIOTIC DAILY PO) Take 1 tablet by mouth daily.    [provider]    Physical Exam: Vitals:   08/18/19 1300 08/18/19 1345 08/18/19 1400 08/18/19 1445  BP: (!) 160/105  (!) 148/97   Pulse: 80 79 74 74  Resp: 15 18 17  (!) 21  Temp:      TempSrc:      SpO2: 99% 97% 94% 95%  Weight:      Height:        Constitutional: NAD, calm, comfortable  Eyes: PERRL, lids and conjunctivae normal ENMT: Mucous membranes are moist. Posterior pharynx clear of any exudate or lesions.Normal dentition.  Neck: normal, supple, no masses, no thyromegaly Respiratory: clear to auscultation bilaterally, no wheezing, no crackles. Normal respiratory effort. No accessory muscle use.  Cardiovascular: Regular rate and rhythm, no murmurs / rubs / gallops. No extremity edema. 2+ pedal pulses. No carotid bruits.  Abdomen: no tenderness, no masses palpated. No hepatosplenomegaly. Bowel sounds positive.  Musculoskeletal: no clubbing / cyanosis. No joint deformity upper and lower extremities. Good ROM, no contractures. Normal muscle tone.  Skin: no rashes, lesions, ulcers. No induration Neurologic: CN 2-12 grossly intact. Sensation intact, DTR normal. Strength 5/5 in all 4.  Psychiatric: Normal judgment and insight. Alert and oriented x 3. Normal mood.    Labs on Admission: I have personally reviewed following labs and imaging studies  CBC: Recent Labs  Lab 08/18/19 1007  WBC 12.1*  HGB 17.5*  HCT 50.8  MCV 83.6  PLT [id number]   Basic Metabolic Panel: Recent Labs  Lab 08/18/19 1007  NA 139  K 3.9  CL 103  CO2 24  GLUCOSE 115*  BUN 15  CREATININE 1.08  CALCIUM 9.2  MG 2.1   GFR: Estimated Creatinine Clearance: 81.5 mL/min (by C-G formula based on SCr of 1.08 mg/dL). Liver Function Tests: No results for input(s): AST, ALT, ALKPHOS, BILITOT, PROT, ALBUMIN in the last 168 hours. No results for input(s): LIPASE, AMYLASE in the last 168 hours. No results for input(s): AMMONIA in the last 168 hours. Coagulation Profile: No results for input(s): INR, PROTIME in the last 168 hours. Cardiac Enzymes: No results for input(s): CKTOTAL, CKMB, CKMBINDEX, TROPONINI in the last 168 hours. BNP (last 3 results) No results for input(s): PROBNP in the last 8760 hours. HbA1C: No results for input(s): HGBA1C in the last 72 hours. CBG: No results for input(s):  GLUCAP in the last 168 hours. Lipid Profile: No results for input(s): CHOL, HDL, LDLCALC, TRIG, CHOLHDL, LDLDIRECT in the last 72 hours. Thyroid Function Tests: Recent Labs    08/18/19 1007  TSH 2.055   Anemia Panel: No results for input(s): VITAMINB12, FOLATE, FERRITIN, TIBC, IRON, RETICCTPCT in the last 72 hours. Urine analysis:    Component Value Date/Time   COLORURINE Straw 07/22/2012 0240   APPEARANCEUR Clear 07/22/2012 0240   LABSPEC 1.005 07/22/2012 0240   PHURINE 6.0 07/22/2012 0240   GLUCOSEU Negative 07/22/2012 0240   HGBUR Negative 07/22/2012 0240   BILIRUBINUR Negative 07/22/2012 0240   KETONESUR Negative 07/22/2012 0240   PROTEINUR Negative 07/22/2012 0240   NITRITE Negative 07/22/2012 0240   LEUKOCYTESUR Negative 07/22/2012 0240    Radiological Exams on Admission: Dg Chest [facility]  Result Date: 08/18/2019 CLINICAL DATA:  Tachycardia. EXAM: PORTABLE CHEST 1 VIEW COMPARISON:  06/08/2018 FINDINGS: Lungs are somewhat hypoinflated without consolidation or effusion. Borderline stable cardiomegaly. Remainder of the exam is unchanged. IMPRESSION: No acute cardiopulmonary disease. Electronically Signed   By: Marin Olp M.D.   On: 08/18/2019 10:38    EKG: Independently reviewed.  Supraventricular tachycardia with heart rate in the 170s.  Assessment/Plan Principal Problem:   SVT (supraventricular tachycardia) (HCC) Active Problems:   HTN (hypertension)   Palpitations     1. Supraventricular tachycardia.  Converted back to sinus rhythm with intravenous amiodarone.  Cardiology following.  Started on oral amiodarone.  Diltiazem currently on hold and he is continued on Lopressor.  TSH found to be normal.  Continue to monitor heart rate overnight to ensure stability. 2. Hypertension.  Continue on Avapro, beta-blockers.  Diltiazem currently on hold.  DVT prophylaxis: Lovenox Code Status: Full code Family Communication: None present Disposition Plan: Discharge  home in a.m. if heart rate remains stable Consults called: Cardiology, [place] Admission status: Observation, telemetry  Kathie Dike MD [facility]   If 7PM-7AM, please contact night-coverage [email or web address]   08/18/2019, 4:18 PM

## 2019-08-18 NOTE — Progress Notes (Signed)
Virtual Visit via Telephone Note   This visit type was conducted due to national recommendations for restrictions regarding the COVID-19 Pandemic (e.g. social distancing) in an effort to limit this patient's exposure and mitigate transmission in our community.  Due to his co-morbid illnesses, this patient is at least at moderate risk for complications without adequate follow up.  This format is felt to be most appropriate for this patient at this time.  The patient did not have access to video technology/had technical difficulties with video requiring transitioning to audio format only (telephone).  All issues noted in this document were discussed and addressed.  No physical exam could be performed with this format.  Please refer to the patient's chart for his  consent to telehealth for [facility].   Date:  08/18/2019   ID:  Jerry Ruiz, DOB 1951-02-19, MRN [id number]  Patient Location: Other:  hospital Provider Location: Office  PCP:  Lajean Manes, MD  Cardiologist:  Larae Grooms, MD  Electrophysiologist:  None   Evaluation Performed:  Follow-Up Visit  Chief Complaint:  HTN  History of Present Illness:    Jerry Ruiz is a 68 y.o. male with  with a hx of Pericardial cyst noted on prior CT scan in 2013, HTN. Echocardiograms have not demonstrated pericardial cyst. Last echo 12/15.   He has had PVCs in the past.  BPs have been difficult to control.  He was seen in the PharmD BP clinic back in 2017, but then went to PMD for a few years after that time.  He had trouble with Hyzaar, losartan: "He notes significant side effects to Hyzaar. He was fatigued and started to notice diffuse arthralgias. His arthralgias have not really improved. However, his fatigue has improved since stopping Hyzaar. (in 2017)"  He has had Diltiazem started recently in 06/2019, and increased to 180 mg daily.   He has had some episodes of PVCs.   Now off of amlodipine, per his report.    He is  getting sleepy at times as well.   He is not exercising regularly.  He is eating out frequently so he needs to decrease salt intake.     Plan at the last visit in 07/2019 was: 1. "PVC: Sx of palpitations.  Mother has AFib.  Wore heart monitor, but did not have any sustained sx during that time.  If more sustained episodes occur, will plan for repeat monitor.  HTN: Start irbesartan 150 mg daily.  BMet in 1 week. Sleep study pending.  Increase exercise.  Decrease salt.  Lose weight.  Hopefully, will be able to back of meds as lifestyle improves.  "   The patient does not have symptoms concerning for COVID-19 infection (fever, chills, cough, or new shortness of breath).    Past Medical History:  Diagnosis Date  . History of echocardiogram    a. Echo 12/15: EF 55-60%, no RWMA, mild LAE, trivial MR  . History of echocardiogram    Echo 12/17: mod conc LVH, EF 55-60, no RWMA, Gr 1 DD, mild AI, calcified AV leaflets, trivial MR, mod LAE, normal RVSF, mild TR, no pericardial eff  . HTN (hypertension)   . PVC's (premature ventricular contractions)    Past Surgical History:  Procedure Laterality Date  . HEMORROIDECTOMY    . KNEE SURGERY    . SHOULDER SURGERY       No outpatient medications have been marked as taking for the 08/19/19 encounter (Appointment) with Jettie Booze, MD.  Allergies:   Amlodipine and Sulfa antibiotics   Social History   Tobacco Use  . Smoking status: Former Research scientist (life sciences)  . Smokeless tobacco: Never Used  Substance Use Topics  . Alcohol use: Not on file  . Drug use: Not on file     Family Hx: The patient's family history includes Arrhythmia in his mother; Heart disease in his mother; Hypertension in his father and mother; Parkinson's disease in his father.  ROS:   Please see the history of present illness.    Recent palpitations All other systems reviewed and are negative.   Prior CV studies:   The following studies were reviewed today:  Recent  ECG showed SVT with RBBB  Labs/Other Tests and Data Reviewed:    EKG:  An ECG dated yesterday was personally reviewed today and demonstrated:  SVT with RBBB, converted to NSR with RBBB  Recent Labs: 08/18/2019: B Natriuretic Peptide 66.0; BUN 15; Creatinine, Ser 1.08; Hemoglobin 17.5; Magnesium 2.1; Platelets 283; Potassium 3.9; Sodium 139; TSH 2.055   Recent Lipid Panel Lab Results  Component Value Date/Time   CHOL 153 05/24/2014 09:27 AM   TRIG 89 05/24/2014 09:27 AM   HDL 30 (L) 05/24/2014 09:27 AM   CHOLHDL 5.1 05/24/2014 09:27 AM   LDLCALC 105 (H) 05/24/2014 09:27 AM    Wt Readings from Last 3 Encounters:  08/18/19 222 lb (100.7 kg)  07/23/19 226 lb 12.8 oz (102.9 kg)  08/22/16 232 lb 8 oz (105.5 kg)     Objective:    Vital Signs:  There were no vitals taken for this visit.   VITAL SIGNS:  reviewed GEN:  no acute distress RESPIRATORY:  no shortness of breath PSYCH:  flat affect exam limited by phone format  ASSESSMENT & PLAN:    1. PVC: Now with SVT as he is in the hospital now, [facility].  Started on Amio.  Will have him f/u with EP since he recalls 4 SVT episodes in the last 6 months while on beta blocker and diltiazem.   2. HTN:  High in the hospital.  I think he will need more meds but he prefers to see the effects of CAP.  Continue irbesartan.  If BP remains  High after starting CPAP for OSA, then would increase irbesartan to 300 mg daily. 3. Pericardial cyst: Not causing sx. 4. ED: likely affected by BP meds. 5. Obesity: Healthy lifestyle.   COVID-19 Education: The signs and symptoms of COVID-19 were discussed with the patient and how to seek care for testing (follow up with PCP or arrange E-visit).  The importance of social distancing was discussed today.  Time:   Today, I have spent 20 minutes with the patient with telehealth technology discussing the above problems.     Medication Adjustments/Labs and Tests Ordered: Current medicines are reviewed at length  with the patient today.  Concerns regarding medicines are outlined above.   Tests Ordered: No orders of the defined types were placed in this encounter.   Medication Changes: No orders of the defined types were placed in this encounter.   Follow Up:  In Person or virtual after he has seen EP  Signed, Larae Grooms, MD  08/18/2019 5:33 PM    [place]

## 2019-08-18 NOTE — ED Triage Notes (Signed)
Pt reports feels like his heart is beating rapidly. Denies pain.

## 2019-08-18 NOTE — ED Provider Notes (Signed)
[facility] Emergency Department Provider Note  ____________________________________________   First MD Initiated Contact with Patient 08/18/19 1008     (approximate)  I have reviewed the triage vital signs and the nursing notes.   HISTORY  Chief Complaint Irregular Heart Beat    HPI Jerry Ruiz is a 68 y.o. male here with weakness and lightheadedness.  The patient reportedly has a history of frequent PVCs, hypertension, and LVH.  He sees Dr. Burgess Amor in [place].  He states that for the last 2 days or so, he has felt generally weak and lightheaded.  He has felt some mild shortness of breath with exertion.  He states that he intermittently feels palpitations, but today, he feels like his heart has not stopped racing.  Has had associated sweating, fatigue.  He denies any overt chest pain.  He states he feels anxious, and like he cannot take a deep breath.  Denies any recent medication changes.  He took all of his blood pressure medicines this morning.  No other complaints.  No fevers or chills.  No alleviating factors.        Past Medical History:  Diagnosis Date  . History of echocardiogram    a. Echo 12/15: EF 55-60%, no RWMA, mild LAE, trivial MR  . History of echocardiogram    Echo 12/17: mod conc LVH, EF 55-60, no RWMA, Gr 1 DD, mild AI, calcified AV leaflets, trivial MR, mod LAE, normal RVSF, mild TR, no pericardial eff  . HTN (hypertension)   . PVC's (premature ventricular contractions)     Patient Active Problem List   Diagnosis Date Noted  . Palpitations 08/18/2019  . SVT (supraventricular tachycardia) ([place])   . PVC's (premature ventricular contractions) 05/20/2014  . Other malaise and fatigue 05/20/2014  . Mitral valve disorders(424.0) 08/11/2013  . Nonspecific abnormal unspecified cardiovascular function study   . HTN (hypertension)     Past Surgical History:  Procedure Laterality Date  . HEMORROIDECTOMY    . KNEE SURGERY    .  SHOULDER SURGERY      Prior to Admission medications   Medication Sig Start Date End Date Taking? Authorizing Provider  amLODipine (NORVASC) 5 MG tablet Take 1 tablet (5 mg total) by mouth daily. 10/15/16 01/13/17  [name] T, PA-C  Cholecalciferol (VITAMIN D) 2000 UNITS CAPS Take 1 capsule by mouth daily.    [provider]  diltiazem (CARDIZEM CD) 180 MG 24 hr capsule Take by mouth.    [provider]  FISH OIL-COENZYME Q10 PO Take 1 capsule by mouth daily. PATIENT NOT SURE OF CURRENT DOSAGE    [provider]  irbesartan (AVAPRO) 150 MG tablet Take 1 tablet (150 mg total) by mouth daily. 07/23/19   Jettie Booze, MD  Magnesium 250 MG TABS Take 1 tablet by mouth daily.    [provider]  metoprolol succinate (TOPROL-XL) 50 MG 24 hr tablet Take 75 mg by mouth 2 (two) times daily. Take 50 mg in the am and 25mg  in the pm by mouth daily. 07/23/19   [provider]  Multiple Vitamin (MULTIVITAMIN) capsule Take 1 capsule by mouth daily.    [provider]  Olive Leaf Extract 250 MG CAPS Take 450 mg by mouth.    [provider]  Probiotic Product (PROBIOTIC DAILY PO) Take 1 tablet by mouth daily.    [provider]    Allergies Amlodipine and Sulfa antibiotics  Family History  Problem Relation Age of  Onset  . Arrhythmia Mother   . Hypertension Mother   . Heart disease Mother   . Parkinson's disease Father   . Hypertension Father     Social History Social History   Tobacco Use  . Smoking status: Former Research scientist (life sciences)  . Smokeless tobacco: Never Used  Substance Use Topics  . Alcohol use: Not on file  . Drug use: Not on file    Review of Systems  Review of Systems  Constitutional: Positive for fatigue. Negative for chills and fever.  HENT: Negative for sore throat.   Respiratory: Positive for chest tightness and shortness of breath.   Cardiovascular: Positive for palpitations. Negative for chest pain.   Gastrointestinal: Negative for abdominal pain.  Genitourinary: Negative for flank pain.  Musculoskeletal: Negative for neck pain.  Skin: Negative for rash and wound.  Allergic/Immunologic: Negative for immunocompromised state.  Neurological: Positive for weakness and light-headedness. Negative for numbness.  Hematological: Does not bruise/bleed easily.  All other systems reviewed and are negative.    ____________________________________________  PHYSICAL EXAM:      VITAL SIGNS: ED Triage Vitals  Enc Vitals Group     BP 08/18/19 1005 (!) 159/131     Pulse Rate 08/18/19 1005 (!) 169     Resp 08/18/19 1005 18     Temp 08/18/19 1005 98.7 F (37.1 C)     Temp Source 08/18/19 1005 Oral     SpO2 08/18/19 1005 97 %     Weight 08/18/19 1006 222 lb (100.7 kg)     Height 08/18/19 1006 6' (1.829 m)     Head Circumference --      Peak Flow --      Pain Score 08/18/19 1007 0     Pain Loc --      Pain Edu? --      Excl. in [place]? --      Physical Exam Vitals signs and nursing note reviewed.  Constitutional:      General: He is not in acute distress.    Appearance: He is well-developed. He is ill-appearing.  HENT:     Head: Normocephalic and atraumatic.  Eyes:     Conjunctiva/sclera: Conjunctivae normal.  Neck:     Musculoskeletal: Neck supple.  Cardiovascular:     Rate and Rhythm: Regular rhythm. Tachycardia present.  No extrasystoles are present.    Heart sounds: Normal heart sounds. No murmur. No friction rub.  Pulmonary:     Effort: Pulmonary effort is normal. No respiratory distress.     Breath sounds: Normal breath sounds. No wheezing or rales.  Abdominal:     General: There is no distension.     Palpations: Abdomen is soft.     Tenderness: There is no abdominal tenderness.  Skin:    General: Skin is warm.     Capillary Refill: Capillary refill takes less than 2 seconds.  Neurological:     Mental Status: He is alert and oriented to person, place, and time.     Motor:  No abnormal muscle tone.       ____________________________________________   LABS (all labs ordered are listed, but only abnormal results are displayed)  Labs Reviewed  BASIC METABOLIC PANEL - Abnormal; Notable for the following components:      Result Value   Glucose, Bld 115 (*)    All other components within normal limits  CBC - Abnormal; Notable for the following components:   WBC 12.1 (*)    RBC 6.08 (*)  Hemoglobin 17.5 (*)    All other components within normal limits  TROPONIN I (HIGH SENSITIVITY) - Abnormal; Notable for the following components:   Troponin I (High Sensitivity) 24 (*)    All other components within normal limits  SARS CORONAVIRUS 2 (TAT 6-24 HRS)  BRAIN NATRIURETIC PEPTIDE  MAGNESIUM  TSH  TROPONIN I (HIGH SENSITIVITY)    ____________________________________________  EKG 1: Wide QRS tachycardia with ventricular rate 170, QRS 144, QTc 504.  When compared to prior, wide-complex tachycardia has replaced sinus rhythm. EKG 2: Normal sinus rhythm, with bifascicular block.  Ventricular rate 103, PR 144, QRS 138, QTc 503.  Frequent PVCs.  __________________________________  RADIOLOGY All imaging, including plain films, CT scans, and ultrasounds, independently reviewed by me, and interpretations confirmed via formal radiology reads.  ED MD interpretation:   CXR: Clear  Official radiology report(s): Dg Chest Port 1 View  Result Date: 08/18/2019 CLINICAL DATA:  Tachycardia. EXAM: PORTABLE CHEST 1 VIEW COMPARISON:  06/08/2018 FINDINGS: Lungs are somewhat hypoinflated without consolidation or effusion. Borderline stable cardiomegaly. Remainder of the exam is unchanged. IMPRESSION: No acute cardiopulmonary disease. Electronically Signed   By: Marin Olp M.D.   On: 08/18/2019 10:38    ____________________________________________  PROCEDURES   Procedure(s) performed (including Critical Care):  .Critical Care Performed by: Duffy Bruce, MD  Authorized by: Duffy Bruce, MD   Critical care provider statement:    Critical care time (minutes):  35   Critical care time was exclusive of:  Separately billable procedures and treating other patients and teaching time   Critical care was necessary to treat or prevent imminent or life-threatening deterioration of the following conditions:  Cardiac failure, circulatory failure and respiratory failure   Critical care was time spent personally by me on the following activities:  Development of treatment plan with patient or surrogate, discussions with consultants, evaluation of patient's response to treatment, examination of patient, obtaining history from patient or surrogate, ordering and performing treatments and interventions, ordering and review of laboratory studies, ordering and review of radiographic studies, pulse oximetry, re-evaluation of patient's condition and review of old charts   I assumed direction of critical care for this patient from another provider in my specialty: no      ____________________________________________  INITIAL IMPRESSION / MDM / [facility] Negron Torres / ED COURSE  As part of my medical decision making, I reviewed the following data within the [place] notes reviewed and incorporated, Old chart reviewed, Notes from prior ED visits, and [place] Controlled Substance Database       *Jerry Ruiz was evaluated in Emergency Department on 08/18/2019 for the symptoms described in the history of present illness. He was evaluated in the context of the global COVID-19 pandemic, which necessitated consideration that the patient might be at risk for infection with the SARS-CoV-2 virus that causes COVID-19. Institutional protocols and algorithms that pertain to the evaluation of patients at risk for COVID-19 are in a state of rapid change based on information released by regulatory bodies including the CDC and federal and state organizations. These  policies and algorithms were followed during the patient's care in the ED.  Some ED evaluations and interventions may be delayed as a result of limited staffing during the pandemic.*  Clinical Course as of Aug 17 1710  Wed Aug 17, 3285  2883 68 year old male here with symptomatic wide-complex tachycardia.  Suspect SVT with aberrancy, though stable V. tach also consideration.  Patient given amnio with conversion to  normal sinus rhythm.  Tolerated well.  Lab work largely reassuring.  Discussed case with [facility] health cardiology, who agrees with amiodarone and recommends admission to hospitalist for observation.  They will see the patient to make recommendations.  Repeat EKG confirmed sinus rhythm, without ST elevations.  No chest pain.   [CI]    Clinical Course User Index [CI] Duffy Bruce, MD    Medical Decision Making:  68 yo M here with new onset SVT vs AFlutter with aberrancy. Less likely VTach. Pt started on amio 150 bolus w/ good effect and spontaneous cardioversion. No evidence of embolism. Cardiology c/s, will admit to medicine.  ____________________________________________  FINAL CLINICAL IMPRESSION(S) / ED DIAGNOSES  Final diagnoses:  Supraventricular tachycardia ([place])  Chest pain, unspecified type     MEDICATIONS GIVEN DURING THIS VISIT:  Medications  amiodarone (PACERONE) tablet 400 mg (400 mg Oral Given 08/18/19 1258)  metoprolol tartrate (LOPRESSOR) tablet 25 mg (0 mg Oral Hold 08/18/19 1442)  irbesartan (AVAPRO) tablet 150 mg (0 mg Oral Hold 08/18/19 1443)  sodium chloride flush (NS) 0.9 % injection 3 mL (3 mLs Intravenous Given 08/18/19 1037)  adenosine (ADENOCARD) 6 MG/2ML injection (  Given by Other 08/18/19 1111)  adenosine (ADENOCARD) 12 MG/4ML injection (  Given by Other 08/18/19 1111)  diltiazem (CARDIZEM) 25 MG/5ML injection (  Given by Other 08/18/19 1112)  amiodarone (NEXTERONE) IV bolus only 150 mg/100 mL (0 mg Intravenous Stopped 08/18/19 1036)     ED Discharge  Orders    None       Note:  This document was prepared using Dragon voice recognition software and may include unintentional dictation errors.   Duffy Bruce, MD 08/18/19 [phone]

## 2019-08-18 NOTE — ED Notes (Signed)
Pt provided with a heart healthy meal/water by pt's request.

## 2019-08-18 NOTE — ED Notes (Addendum)
Reports he takes cardizem and metoprolol at home, no new medications or changes.

## 2019-08-18 NOTE — Consult Note (Signed)
Cardiology Consultation:   Patient ID: DEMONTREY DAVYDOV MRN: [id number]; DOB: May 03, 1951  Admit date: 08/18/2019 Date of Consult: 08/18/2019  Primary Care Provider: Lajean Manes, MD Primary Cardiologist: Larae Grooms, MD  Primary Electrophysiologist:  None    Patient Profile:   Jerry Ruiz is a 68 y.o. male with a hx of PVCs, hypertension who is being seen today for the evaluation of SVT at the request of Dr. Ellender Hose.  History of Present Illness:   Jerry Ruiz is a 68 year old gentleman with a history of hypertension, OSA, PVCs who presents due to persistent palpitations.  Patient with a known history of PVCs and palpitations in the past.  Cardiac monitor did not reveal any sustained arrhythmias.  About 2 to 3 weeks ago he states having palpitation lasting for about 30 minutes 1 hour.  He was getting ready to come to the emergency room when symptoms resolved and he decided not to come.   Earlier this morning at about 8 AM, patient woke up from sleep with elevated heart rates.  He thought the symptoms might go away but persisted.  He took a shower and was brought to the emergency room by his wife about an hour from onset.  Patient denies chest pain, shortness of breath, edema, dizziness.  He was recently diagnosed with sleep apnea and has a follow-up appointment for mask fitting.  In the emergency room, initial EKG showed tachycardia with heart rate 170, looks like SVT upon my review.  Prior EKGs showed that patient has a baseline right bundle branch block and left anterior fascicular block..  Vagal maneuvers were attempted with mild improvement in heart rates to low 100s for a few seconds.  Patient was then given IV amiodarone 150 mg with heart rates improving to sinus rhythm tachycardia heart rates in the low 100s and currently sinus rhythm.  Heart Pathway Score:     Past Medical History:  Diagnosis Date  . History of echocardiogram    a. Echo 12/15: EF 55-60%, no RWMA, mild LAE,  trivial MR  . History of echocardiogram    Echo 12/17: mod conc LVH, EF 55-60, no RWMA, Gr 1 DD, mild AI, calcified AV leaflets, trivial MR, mod LAE, normal RVSF, mild TR, no pericardial eff  . HTN (hypertension)   . PVC's (premature ventricular contractions)     Past Surgical History:  Procedure Laterality Date  . HEMORROIDECTOMY    . KNEE SURGERY    . SHOULDER SURGERY       Home Medications:  Prior to Admission medications   Medication Sig Start Date End Date Taking? Authorizing Provider  amLODipine (NORVASC) 5 MG tablet Take 1 tablet (5 mg total) by mouth daily. 10/15/16 01/13/17  [name] T, PA-C  Cholecalciferol (VITAMIN D) 2000 UNITS CAPS Take 1 capsule by mouth daily.    [provider]  diltiazem (CARDIZEM CD) 180 MG 24 hr capsule Take by mouth.    [provider]  FISH OIL-COENZYME Q10 PO Take 1 capsule by mouth daily. PATIENT NOT SURE OF CURRENT DOSAGE    [provider]  irbesartan (AVAPRO) 150 MG tablet Take 1 tablet (150 mg total) by mouth daily. 07/23/19   Jettie Booze, MD  Magnesium 250 MG TABS Take 1 tablet by mouth daily.    [provider]  metoprolol succinate (TOPROL-XL) 50 MG 24 hr tablet Take 75 mg by mouth 2 (two) times daily. Take 50 mg in the am and 25mg  in the pm by mouth  daily. 07/23/19   [provider]  Multiple Vitamin (MULTIVITAMIN) capsule Take 1 capsule by mouth daily.    [provider]  Olive Leaf Extract 250 MG CAPS Take 450 mg by mouth.    [provider]  Probiotic Product (PROBIOTIC DAILY PO) Take 1 tablet by mouth daily.    [provider]    Inpatient Medications: Scheduled Meds:  Continuous Infusions:  PRN Meds:   Allergies:    Allergies  Allergen Reactions  . Amlodipine Other (See Comments)  . Sulfa Antibiotics Rash, Hives and Itching    Social History:   Social History   Socioeconomic History  . Marital status: Single    Spouse name: Not on file   . Number of children: Not on file  . Years of education: Not on file  . Highest education level: Not on file  Occupational History  . Not on file  Social Needs  . Financial resource strain: Not on file  . Food insecurity    Worry: Not on file    Inability: Not on file  . Transportation needs    Medical: Not on file    Non-medical: Not on file  Tobacco Use  . Smoking status: Former Research scientist (life sciences)  . Smokeless tobacco: Never Used  Substance and Sexual Activity  . Alcohol use: Not on file  . Drug use: Not on file  . Sexual activity: Not on file  Lifestyle  . Physical activity    Days per week: Not on file    Minutes per session: Not on file  . Stress: Not on file  Relationships  . Social Herbalist on phone: Not on file    Gets together: Not on file    Attends religious service: Not on file    Active member of club or organization: Not on file    Attends meetings of clubs or organizations: Not on file    Relationship status: Not on file  . Intimate partner violence    Fear of current or ex partner: Not on file    Emotionally abused: Not on file    Physically abused: Not on file    Forced sexual activity: Not on file  Other Topics Concern  . Not on file  Social History Narrative  . Not on file    Family History:    Family History  Problem Relation Age of Onset  . Arrhythmia Mother   . Hypertension Mother   . Heart disease Mother   . Parkinson's disease Father   . Hypertension Father      ROS:  Please see the history of present illness.   All other ROS reviewed and negative.     Physical Exam/Data:   Vitals:   08/18/19 1028 08/18/19 1030 08/18/19 1037 08/18/19 1057  BP:  (!) 159/120    Pulse: (!) 49 95 93 90  Resp: 13 18 16 13   Temp:      TempSrc:      SpO2: 96% 93% 96% 92%  Weight:      Height:       No intake or output data in the 24 hours ending 08/18/19 1202 Last 3 Weights 08/18/2019 08/18/2019 07/23/2019  Weight (lbs) 222 lb 222 lb 226 lb  12.8 oz  Weight (kg) 100.699 kg 100.699 kg 102.876 kg     Body mass index is 30.11 kg/m.  General:  Well nourished, well developed, in no acute distress HEENT: normal Lymph: no adenopathy Neck: no  JVD Endocrine:  No thryomegaly Vascular: No carotid bruits; FA pulses 2+ bilaterally without bruits  Cardiac:  normal S1, S2; RRR; no murmur  Lungs:  clear to auscultation bilaterally, no wheezing, rhonchi or rales  Abd: soft, nontender, no hepatomegaly  Ext: no edema Musculoskeletal:  No deformities, BUE and BLE strength normal and equal Skin: warm and dry  Neuro:  CNs 2-12 intact, no focal abnormalities noted Psych:  Normal affect   EKG:  The EKG was personally reviewed and demonstrates: Supraventricular tachycardia, heart rate 170. Telemetry:  Telemetry was personally reviewed and demonstrates: Sinus rhythm, heart rate 80s  Relevant CV Studies: TTE 08/02/2019 1. Left ventricular ejection fraction, by visual estimation, is 55 to 60%. The left ventricle has normal function. Left ventricular septal wall thickness was moderately increased. Moderately increased left ventricular posterior wall thickness. There is  moderately increased left ventricular hypertrophy.  2. Left ventricular diastolic parameters are consistent with Grade I diastolic dysfunction (impaired relaxation).  3. Global right ventricle has normal systolic function.The right ventricular size is normal. No increase in right ventricular wall thickness.  4. Left atrial size was severely dilated.  5. Right atrial size was normal.  6. The mitral valve is normal in structure. Trace mitral valve regurgitation. No evidence of mitral stenosis.  7. The tricuspid valve is normal in structure. Tricuspid valve regurgitation is not demonstrated.  8. The aortic valve is normal in structure. Aortic valve regurgitation is trivial. No evidence of aortic valve sclerosis or stenosis.  9. The pulmonic valve was normal in structure. Pulmonic valve  regurgitation is not visualized. 10. TR signal is inadequate for assessing pulmonary artery systolic pressure. 11. The inferior vena cava is dilated in size with <50% respiratory variability, suggesting right atrial pressure of 15 mmHg  Laboratory Data:  High Sensitivity Troponin:   Recent Labs  Lab 08/18/19 1007  TROPONINIHS 17     Chemistry Recent Labs  Lab 08/18/19 1007  NA 139  K 3.9  CL 103  CO2 24  GLUCOSE 115*  BUN 15  CREATININE 1.08  CALCIUM 9.2  GFRNONAA >60  GFRAA >60  ANIONGAP 12    No results for input(s): PROT, ALBUMIN, AST, ALT, ALKPHOS, BILITOT in the last 168 hours. Hematology Recent Labs  Lab 08/18/19 1007  WBC 12.1*  RBC 6.08*  HGB 17.5*  HCT 50.8  MCV 83.6  MCH 28.8  MCHC 34.4  RDW 13.8  PLT 283   BNP Recent Labs  Lab 08/18/19 1007  BNP 66.0    DDimer No results for input(s): DDIMER in the last 168 hours.   Radiology/Studies:  Dg Chest Port 1 View  Result Date: 08/18/2019 CLINICAL DATA:  Tachycardia. EXAM: PORTABLE CHEST 1 VIEW COMPARISON:  06/08/2018 FINDINGS: Lungs are somewhat hypoinflated without consolidation or effusion. Borderline stable cardiomegaly. Remainder of the exam is unchanged. IMPRESSION: No acute cardiopulmonary disease. Electronically Signed   By: Marin Olp M.D.   On: 08/18/2019 10:38    Assessment and Plan:  68 year old gentleman with history of obstructive sleep apnea, hypertension, palpitations presenting with increased heart rates.  Found to have SVT now resolved after amiodarone bolus of 150 mg IV.  1. SVT -Last echocardiogram with no structural abnormalities. -Initiate amiodarone 400 mg p.o. twice daily -Lopressor 25 mg twice daily.   -Hold diltiazem PTA with starting amiodarone. -Monitor patient on telemetry for any recurrence of arrhythmia. -If patient maintained sinus rhythm, will discharge on amiodarone and beta-blocker with close outpatient follow-up  2.  Hypertension -Restart  PTA Avapro 150 mg  daily  Signed, Kate Sable, MD  08/18/2019 12:02 PM

## 2019-08-18 NOTE — ED Notes (Addendum)
Pt standing on the side of the bed using urinal at this time. This RN at bedside monitoring pt. Pt placed on bed safely. Monitor placed.

## 2019-08-19 ENCOUNTER — Telehealth (INDEPENDENT_AMBULATORY_CARE_PROVIDER_SITE_OTHER): Payer: PPO | Admitting: Interventional Cardiology

## 2019-08-19 ENCOUNTER — Encounter: Payer: Self-pay | Admitting: Interventional Cardiology

## 2019-08-19 VITALS — BP 164/92 | HR 71 | Temp 97.6°F | Ht 72.0 in | Wt 225.0 lb

## 2019-08-19 DIAGNOSIS — I493 Ventricular premature depolarization: Secondary | ICD-10-CM | POA: Diagnosis not present

## 2019-08-19 DIAGNOSIS — Q248 Other specified congenital malformations of heart: Secondary | ICD-10-CM

## 2019-08-19 DIAGNOSIS — N529 Male erectile dysfunction, unspecified: Secondary | ICD-10-CM | POA: Diagnosis not present

## 2019-08-19 DIAGNOSIS — I471 Supraventricular tachycardia, unspecified: Secondary | ICD-10-CM

## 2019-08-19 DIAGNOSIS — I10 Essential (primary) hypertension: Secondary | ICD-10-CM

## 2019-08-19 DIAGNOSIS — E669 Obesity, unspecified: Secondary | ICD-10-CM | POA: Diagnosis not present

## 2019-08-19 DIAGNOSIS — R002 Palpitations: Secondary | ICD-10-CM

## 2019-08-19 DIAGNOSIS — R079 Chest pain, unspecified: Secondary | ICD-10-CM | POA: Diagnosis not present

## 2019-08-19 LAB — SARS CORONAVIRUS 2 (TAT 6-24 HRS): SARS Coronavirus 2: NEGATIVE

## 2019-08-19 LAB — CBC
HCT: 46.1 % (ref 39.0–52.0)
Hemoglobin: 15.5 g/dL (ref 13.0–17.0)
MCH: 28.5 pg (ref 26.0–34.0)
MCHC: 33.6 g/dL (ref 30.0–36.0)
MCV: 84.7 fL (ref 80.0–100.0)
Platelets: 257 10*3/uL (ref 150–400)
RBC: 5.44 MIL/uL (ref 4.22–5.81)
RDW: 14 % (ref 11.5–15.5)
WBC: 9.8 10*3/uL (ref 4.0–10.5)
nRBC: 0 % (ref 0.0–0.2)

## 2019-08-19 LAB — BASIC METABOLIC PANEL
Anion gap: 10 (ref 5–15)
BUN: 19 mg/dL (ref 8–23)
CO2: 25 mmol/L (ref 22–32)
Calcium: 8.6 mg/dL — ABNORMAL LOW (ref 8.9–10.3)
Chloride: 104 mmol/L (ref 98–111)
Creatinine, Ser: 1.05 mg/dL (ref 0.61–1.24)
GFR calc Af Amer: >60 mL/min (ref >60)
GFR calc non Af Amer: >60 mL/min (ref >60)
Glucose, Bld: 107 mg/dL — ABNORMAL HIGH (ref 70–99)
Potassium: 3.8 mmol/L (ref 3.5–5.1)
Sodium: 139 mmol/L (ref 135–145)

## 2019-08-19 MED ORDER — IRBESARTAN 150 MG PO TABS: 300.0000 mg | ORAL_TABLET | Freq: Every day | ORAL | Status: DC

## 2019-08-19 MED ORDER — AMIODARONE HCL 400 MG PO TABS: ORAL_TABLET | ORAL | 0 refills | Status: DC

## 2019-08-19 MED ORDER — METOPROLOL SUCCINATE ER 50 MG PO TB24: 50.0000 mg | ORAL_TABLET | Freq: Every day | ORAL | Status: DC

## 2019-08-19 MED ORDER — IRBESARTAN 300 MG PO TABS: 300.0000 mg | ORAL_TABLET | Freq: Every day | ORAL | 0 refills | Status: DC

## 2019-08-19 MED ORDER — DILTIAZEM HCL ER COATED BEADS 120 MG PO CP24: 120.0000 mg | ORAL_CAPSULE | Freq: Every day | ORAL | Status: DC

## 2019-08-19 MED ORDER — DILTIAZEM HCL ER COATED BEADS 120 MG PO CP24: 120.0000 mg | ORAL_CAPSULE | Freq: Every day | ORAL | 0 refills | Status: DC

## 2019-08-19 MED ORDER — METOPROLOL SUCCINATE ER 50 MG PO TB24: 50.0000 mg | ORAL_TABLET | Freq: Every day | ORAL | 0 refills | Status: DC

## 2019-08-19 NOTE — Progress Notes (Addendum)
Assisting Tammy RN with discharge paperwork.   Leron Croak to be D/C'd Home per MD order. Patient given discharge teaching and paperwork regarding medications, diet, follow-up appointments and activity. Patient understanding verbalized. No questions or complaints at this time. IV and telemetry removed prior to leaving.  No further needs by Care Management/Social Work.   An After Visit Summary was printed and given to the patient.   Patient to be escorted via wheelchair.  Terrilyn Saver

## 2019-08-19 NOTE — Discharge Summary (Signed)
Physician Discharge Summary  Jerry Ruiz [id number] DOB: 09/02/1951 DOA: 08/18/2019  PCP: Lajean Manes, MD  Admit date: 08/18/2019 Discharge date: 08/19/2019  Admitted From: home Disposition:  home  Recommendations for Outpatient Follow-up:  1. Patient has been referred to electrophysiology 2. Follow up with primary cardiologist in 1 month  Discharge Condition:stable CODE STATUS:full code Diet recommendation: heart healthy  Brief/Interim Summary: 68 y/o male with history of HTN and palpitations was admitted to the hospital with persistent palpitations. EKG in the ED confirmed SVT with heart rate in the 170s. He received intravenous amiodarone and promptly converted back to sinus rhythm. Since that time, his heart rate has maintained in the 60s. He was seen by cardiology who continued him on oral amiodarone and decreased dosing of toprol and diltiazem since HR was in the 60s. Irbesartan dosing was increased since blood pressures were running high. He is feeling significantly better at this time and is ready for discharge home. He will be referred to see electrophysiology since he has a long history of palpitations. The remainder of his medical issues were stable.  Discharge Diagnoses:  Principal Problem:   SVT (supraventricular tachycardia) (HCC) Active Problems:   HTN (hypertension)   Palpitations    Discharge Instructions  Discharge Instructions    Diet - low sodium heart healthy   Complete by: As directed    Increase activity slowly   Complete by: As directed      Allergies as of 08/19/2019      Reactions   Amlodipine Other (See Comments)   Sulfa Antibiotics Rash, Hives, Itching      Medication List    STOP taking these medications   amLODipine 5 MG tablet Commonly known as: NORVASC   losartan 25 MG tablet Commonly known as: COZAAR     TAKE these medications   amiodarone 400 MG tablet Commonly known as: PACERONE Take 400mg  po bid for 4 days then 200mg   bid   diltiazem 120 MG 24 hr capsule Commonly known as: CARDIZEM CD Take 1 capsule (120 mg total) by mouth daily. What changed:   medication strength  how much to take   FISH OIL-COENZYME Q10 PO Take 1 capsule by mouth daily. PATIENT NOT SURE OF CURRENT DOSAGE   irbesartan 300 MG tablet Commonly known as: AVAPRO Take 1 tablet (300 mg total) by mouth daily. What changed:   medication strength  how much to take   Magnesium 250 MG Tabs Take 1 tablet by mouth daily.   metoprolol succinate 50 MG 24 hr tablet Commonly known as: TOPROL-XL Take 1 tablet (50 mg total) by mouth daily. Take 50 mg in the am and 25mg  in the pm by mouth daily. What changed:   how much to take  when to take this   multivitamin capsule Take 1 capsule by mouth daily.   Olive Leaf Extract 250 MG Caps Take 450 mg by mouth.   PROBIOTIC DAILY PO Take 1 tablet by mouth daily.   Vitamin D 50 MCG (2000 UT) Caps Take 1 capsule by mouth daily.      Follow-up Information    Jettie Booze, MD Follow up in 1 month(s).   Specialties: Cardiology, Radiology, Interventional Cardiology Contact information: [id number] [place] [address] [phone]          Allergies  Allergen Reactions  . Amlodipine Other (See Comments)  . Sulfa Antibiotics Rash, Hives and Itching    Consultations:  cardiology   Procedures/Studies: DG  Chest Port 1 View  Result Date: 08/18/2019 CLINICAL DATA:  Tachycardia. EXAM: PORTABLE CHEST 1 VIEW COMPARISON:  06/08/2018 FINDINGS: Lungs are somewhat hypoinflated without consolidation or effusion. Borderline stable cardiomegaly. Remainder of the exam is unchanged. IMPRESSION: No acute cardiopulmonary disease. Electronically Signed   By: Marin Olp M.D.   On: 08/18/2019 10:38   ECHOCARDIOGRAM COMPLETE  Result Date: 08/02/2019   ECHOCARDIOGRAM REPORT   Patient Name:   Jerry Ruiz Date of Exam: 08/02/2019 Medical Rec #:  [id number]      Height:       72.0 in Accession #:    [id number]    Weight:       226.8 lb Date of Birth:  [birth year]      BSA:          2.25 m Patient Age:    18 years      BP:           179/96 mmHg Patient Gender: M             HR:           65 bpm. Exam Location:  [facility] Procedure: 2D Echo, Color Doppler and Cardiac Doppler Indications:    R06.9 DOE  History:        Patient has prior history of Echocardiogram examinations, most                 recent 08/22/2016. Risk Factors:Hypertension.  Sonographer:    Raquel Sarna Senior RDCS Referring Phys: [place]  1. Left ventricular ejection fraction, by visual estimation, is 55 to 60%. The left ventricle has normal function. Left ventricular septal wall thickness was moderately increased. Moderately increased left ventricular posterior wall thickness. There is moderately increased left ventricular hypertrophy.  2. Left ventricular diastolic parameters are consistent with Grade I diastolic dysfunction (impaired relaxation).  3. Global right ventricle has normal systolic function.The right ventricular size is normal. No increase in right ventricular wall thickness.  4. Left atrial size was severely dilated.  5. Right atrial size was normal.  6. The mitral valve is normal in structure. Trace mitral valve regurgitation. No evidence of mitral stenosis.  7. The tricuspid valve is normal in structure. Tricuspid valve regurgitation is not demonstrated.  8. The aortic valve is normal in structure. Aortic valve regurgitation is trivial. No evidence of aortic valve sclerosis or stenosis.  9. The pulmonic valve was normal in structure. Pulmonic valve regurgitation is not visualized. 10. TR signal is inadequate for assessing pulmonary artery systolic pressure. 11. The inferior vena cava is dilated in size with <50% respiratory variability, suggesting right atrial pressure of 15 mmHg. FINDINGS  Left Ventricle: Left ventricular ejection fraction, by visual estimation, is 55  to 60%. The left ventricle has normal function. Moderately increased left ventricular posterior wall thickness. There is moderately increased left ventricular hypertrophy. Left ventricular diastolic parameters are consistent with Grade I diastolic dysfunction (impaired relaxation). Indeterminate filling pressures. Right Ventricle: The right ventricular size is normal. No increase in right ventricular wall thickness. Global RV systolic function is has normal systolic function. Left Atrium: Left atrial size was severely dilated. Right Atrium: Right atrial size was normal in size Pericardium: There is no evidence of pericardial effusion. Mitral Valve: The mitral valve is normal in structure. No evidence of mitral valve stenosis by observation. Trace mitral valve regurgitation. Tricuspid Valve: The tricuspid valve is normal in structure. Tricuspid valve regurgitation is not demonstrated. Aortic Valve: The aortic valve  is normal in structure. Aortic valve regurgitation is trivial. The aortic valve is structurally normal, with no evidence of sclerosis or stenosis. Pulmonic Valve: The pulmonic valve was normal in structure. Pulmonic valve regurgitation is not visualized. Aorta: The aortic root, ascending aorta and aortic arch are all structurally normal, with no evidence of dilitation or obstruction. Venous: The inferior vena cava was not well visualized. The inferior vena cava is dilated in size with less than 50% respiratory variability, suggesting right atrial pressure of 15 mmHg. IAS/Shunts: No atrial level shunt detected by color flow Doppler. No ventricular septal defect is seen or detected. There is no evidence of an atrial septal defect.  LEFT VENTRICLE PLAX 2D LVIDd:         4.07 cm       Diastology LVIDs:         2.63 cm       LV e' lateral:   4.90 cm/s LV PW:         1.65 cm       LV E/e' lateral: 14.0 LV IVS:        1.57 cm       LV e' medial:    6.42 cm/s LVOT diam:     2.00 cm       LV E/e' medial:  10.7 LV  SV:         48 ml LV SV Index:   20.60 LVOT Area:     3.14 cm  LV Volumes (MOD) LV area d, A2C:    39.20 cm LV area d, A4C:    45.90 cm LV area s, A2C:    23.50 cm LV area s, A4C:    27.00 cm LV major d, A2C:   9.73 cm LV major d, A4C:   9.33 cm LV major s, A2C:   8.37 cm LV major s, A4C:   7.43 cm LV vol d, MOD A2C: 130.0 ml LV vol d, MOD A4C: 182.0 ml LV vol s, MOD A2C: 55.6 ml LV vol s, MOD A4C: 81.7 ml LV SV MOD A2C:     74.4 ml LV SV MOD A4C:     182.0 ml LV SV MOD BP:      85.4 ml RIGHT VENTRICLE RV S prime:     19.30 cm/s TAPSE (M-mode): 2.9 cm LEFT ATRIUM              Index       RIGHT ATRIUM           Index LA diam:        4.90 cm  2.18 cm/m  RA Area:     18.70 cm LA Vol (A2C):   98.8 ml  43.96 ml/m RA Volume:   54.00 ml  24.03 ml/m LA Vol (A4C):   94.0 ml  41.82 ml/m LA Biplane Vol: 102.0 ml 45.38 ml/m  AORTIC VALVE LVOT Vmax:   115.00 cm/s LVOT Vmean:  77.400 cm/s LVOT VTI:    0.233 m  AORTA Ao Root diam: 3.10 cm Ao Asc diam:  3.10 cm MITRAL VALVE MV Area (PHT): 3.48 cm             SHUNTS MV PHT:        63.22 msec           Systemic VTI:  0.23 m MV Decel Time: 218 msec             Systemic Diam: 2.00 cm MV E velocity: 68.40 cm/s  103 cm/s MV A velocity: 71.30 cm/s 70.3 cm/s MV E/A ratio:  0.96       1.5  Skeet Latch MD Electronically signed by Skeet Latch MD Signature Date/Time: 08/02/2019/4:27:17 PM    Final        Subjective: Feeling better. No persistent palpitations, no chest pain, no shortness of breath  Discharge Exam: Vitals:   08/19/19 0300 08/19/19 0351 08/19/19 0416 08/19/19 0839  BP:  (!) 148/93 (!) 158/91 (!) 164/92  Pulse:   65 71  Resp: 18  20 18   Temp:   97.7 F (36.5 C) 97.6 F (36.4 C)  TempSrc:   Oral Oral  SpO2: 98%  100% 98%  Weight:   102.1 kg   Height:   6' (1.829 m)     General: Pt is alert, awake, not in acute distress Cardiovascular: RRR, S1/S2 +, no rubs, no gallops Respiratory: CTA bilaterally, no wheezing, no rhonchi Abdominal:  Soft, NT, ND, bowel sounds + Extremities: no edema, no cyanosis    The results of significant diagnostics from this hospitalization (including imaging, microbiology, ancillary and laboratory) are listed below for reference.     Microbiology: Recent Results (from the past 240 hour(s))  SARS CORONAVIRUS 2 (TAT 6-24 HRS) Nasopharyngeal Nasopharyngeal Swab     Status: None   Collection Time: 08/18/19  5:33 PM   Specimen: Nasopharyngeal Swab  Result Value Ref Range Status   SARS Coronavirus 2 NEGATIVE NEGATIVE Final    Comment: (NOTE) SARS-CoV-2 target nucleic acids are NOT DETECTED. The SARS-CoV-2 RNA is generally detectable in upper and lower respiratory specimens during the acute phase of infection. Negative results do not preclude SARS-CoV-2 infection, do not rule out co-infections with other pathogens, and should not be used as the sole basis for treatment or other patient management decisions. Negative results must be combined with clinical observations, patient history, and epidemiological information. The expected result is Negative. Fact Sheet for Patients: SugarRoll.be Fact Sheet for Healthcare Providers: https://www.woods-mathews.com/ This test is not yet approved or cleared by the [place] FDA and  has been authorized for detection and/or diagnosis of SARS-CoV-2 by FDA under an Emergency Use Authorization (EUA). This EUA will remain  in effect (meaning this test can be used) for the duration of the COVID-19 declaration under Section 56 4(b)(1) of the Act, 21 U.S.C. section 360bbb-3(b)(1), unless the authorization is terminated or revoked sooner. Performed at [facility], [place] [address], [place], [place] [address]      Labs: BNP (last 3 results) Recent Labs    08/18/19 1007  BNP [id number]   Basic Metabolic Panel: Recent Labs  Lab 08/18/19 1007 08/18/19 1952 08/19/19 0453  NA 139  --  139  K 3.9  --  3.8  CL  103  --  104  CO2 24  --  25  GLUCOSE 115*  --  107*  BUN 15  --  19  CREATININE 1.08 1.30* 1.05  CALCIUM 9.2  --  8.6*  MG 2.1  --   --    Liver Function Tests: No results for input(s): AST, ALT, ALKPHOS, BILITOT, PROT, ALBUMIN in the last 168 hours. No results for input(s): LIPASE, AMYLASE in the last 168 hours. No results for input(s): AMMONIA in the last 168 hours. CBC: Recent Labs  Lab 08/18/19 1007 08/18/19 1952 08/19/19 0453  WBC 12.1* 10.9* 9.8  HGB 17.5* 17.2* 15.5  HCT 50.8 50.9 46.1  MCV 83.6 84.7 84.7  PLT 283 292 257  Cardiac Enzymes: No results for input(s): CKTOTAL, CKMB, CKMBINDEX, TROPONINI in the last 168 hours. BNP: Invalid input(s): POCBNP CBG: No results for input(s): GLUCAP in the last 168 hours. D-Dimer No results for input(s): DDIMER in the last 72 hours. Hgb A1c No results for input(s): HGBA1C in the last 72 hours. Lipid Profile No results for input(s): CHOL, HDL, LDLCALC, TRIG, CHOLHDL, LDLDIRECT in the last 72 hours. Thyroid function studies Recent Labs    08/18/19 1007  TSH 2.055   Anemia work up No results for input(s): VITAMINB12, FOLATE, FERRITIN, TIBC, IRON, RETICCTPCT in the last 72 hours. Urinalysis    Component Value Date/Time   COLORURINE Straw 07/22/2012 0240   APPEARANCEUR Clear 07/22/2012 0240   LABSPEC 1.005 07/22/2012 0240   PHURINE 6.0 07/22/2012 0240   GLUCOSEU Negative 07/22/2012 0240   HGBUR Negative 07/22/2012 0240   BILIRUBINUR Negative 07/22/2012 0240   KETONESUR Negative 07/22/2012 0240   PROTEINUR Negative 07/22/2012 0240   NITRITE Negative 07/22/2012 0240   LEUKOCYTESUR Negative 07/22/2012 0240   Sepsis Labs Invalid input(s): PROCALCITONIN,  WBC,  LACTICIDVEN Microbiology Recent Results (from the past 240 hour(s))  SARS CORONAVIRUS 2 (TAT 6-24 HRS) Nasopharyngeal Nasopharyngeal Swab     Status: None   Collection Time: 08/18/19  5:33 PM   Specimen: Nasopharyngeal Swab  Result Value Ref Range Status    SARS Coronavirus 2 NEGATIVE NEGATIVE Final    Comment: (NOTE) SARS-CoV-2 target nucleic acids are NOT DETECTED. The SARS-CoV-2 RNA is generally detectable in upper and lower respiratory specimens during the acute phase of infection. Negative results do not preclude SARS-CoV-2 infection, do not rule out co-infections with other pathogens, and should not be used as the sole basis for treatment or other patient management decisions. Negative results must be combined with clinical observations, patient history, and epidemiological information. The expected result is Negative. Fact Sheet for Patients: SugarRoll.be Fact Sheet for Healthcare Providers: https://www.woods-mathews.com/ This test is not yet approved or cleared by the [place] FDA and  has been authorized for detection and/or diagnosis of SARS-CoV-2 by FDA under an Emergency Use Authorization (EUA). This EUA will remain  in effect (meaning this test can be used) for the duration of the COVID-19 declaration under Section 56 4(b)(1) of the Act, 21 U.S.C. section 360bbb-3(b)(1), unless the authorization is terminated or revoked sooner. Performed at [facility], [place] [address] [place], [place] [address]      Time coordinating discharge: 60mins  SIGNED:   Kathie Dike, MD  [facility] 08/19/2019, 1:58 PM   If 7PM-7AM, please contact night-coverage [email or web address]

## 2019-08-19 NOTE — Progress Notes (Signed)
Report given to yasmin for floor RN who is in isolation room

## 2019-08-19 NOTE — Patient Instructions (Addendum)
Medication Instructions:  Your physician recommends that you continue on your current medications as directed. Please refer to the Current Medication list given to you today.  If you need a refill on your cardiac medications before your next appointment, please call your pharmacy.   Lab work: None Ordered  If you have labs (blood work) drawn today and your tests are completely normal, you will receive your results only by: Marland Kitchen MyChart Message (if you have MyChart) OR . A paper copy in the mail If you have any lab test that is abnormal or we need to change your treatment, we will call you to review the results.  Testing/Procedures: None ordered  Follow-Up: . You have been referred to one of our Electrophysiologists to discuss SVT ablation . Follow up with Dr. Irish Lack 2-3 months after seen by EP .  Any Other Special Instructions Will Be Listed Below (If Applicable).

## 2019-08-19 NOTE — Progress Notes (Signed)
Progress Note  Patient Name: Jerry Ruiz Date of Encounter: 08/19/2019 Primary Cardiologist: Larae Grooms, MD   Subjective   Feels well this morning, denies any further episodes of tachycardia Long discussion concerning his blood pressure Reports this was his fourth episode of tachycardia, lasting now for hours, more symptomatic Also symptomatic from PVCs  Inpatient Medications    Scheduled Meds: . amiodarone  400 mg Oral BID  . diltiazem  120 mg Oral Daily  . enoxaparin (LOVENOX) injection  40 mg Subcutaneous Q24H  . [START ON 08/20/2019] irbesartan  300 mg Oral Daily  . metoprolol succinate  50 mg Oral Daily   Continuous Infusions:  PRN Meds: acetaminophen **OR** acetaminophen, ondansetron **OR** ondansetron (ZOFRAN) IV   Vital Signs    Vitals:   08/19/19 0300 08/19/19 0351 08/19/19 0416 08/19/19 0839  BP:  (!) 148/93 (!) 158/91 (!) 164/92  Pulse:   65 71  Resp: 18  20 18   Temp:   97.7 F (36.5 C) 97.6 F (36.4 C)  TempSrc:   Oral Oral  SpO2: 98%  100% 98%  Weight:   102.1 kg   Height:   6' (1.829 m)     Intake/Output Summary (Last 24 hours) at 08/19/2019 1145 Last data filed at 08/19/2019 0900 Gross per 24 hour  Intake 584.29 ml  Output 900 ml  Net -315.71 ml   Last 3 Weights 08/19/2019 08/19/2019 08/18/2019  Weight (lbs) 225 lb 225 lb 222 lb  Weight (kg) 102.059 kg 102.059 kg 100.699 kg      Telemetry    Normal sinus rhythm- Personally Reviewed  ECG    SVT on arrival -personally Reviewed  Physical Exam   GEN: No acute distress.   Neck: No JVD Cardiac: RRR, no murmurs, rubs, or gallops.  Respiratory: Clear to auscultation bilaterally. GI: Soft, nontender, non-distended  MS: No edema; No deformity. Neuro:  Nonfocal  Psych: Normal affect   Labs    High Sensitivity Troponin:   Recent Labs  Lab 08/18/19 1007 08/18/19 1245  TROPONINIHS 17 24*      Chemistry Recent Labs  Lab 08/18/19 1007 08/18/19 1952 08/19/19 0453  NA  139  --  139  K 3.9  --  3.8  CL 103  --  104  CO2 24  --  25  GLUCOSE 115*  --  107*  BUN 15  --  19  CREATININE 1.08 1.30* 1.05  CALCIUM 9.2  --  8.6*  GFRNONAA >60 56* >60  GFRAA >60 >60 >60  ANIONGAP 12  --  10     Hematology Recent Labs  Lab 08/18/19 1007 08/18/19 1952 08/19/19 0453  WBC 12.1* 10.9* 9.8  RBC 6.08* 6.01* 5.44  HGB 17.5* 17.2* 15.5  HCT 50.8 50.9 46.1  MCV 83.6 84.7 84.7  MCH 28.8 28.6 28.5  MCHC 34.4 33.8 33.6  RDW 13.8 14.0 14.0  PLT 283 292 257    BNP Recent Labs  Lab 08/18/19 1007  BNP 66.0     DDimer No results for input(s): DDIMER in the last 168 hours.   Radiology    DG Chest Port 1 View  Result Date: 08/18/2019 CLINICAL DATA:  Tachycardia. EXAM: PORTABLE CHEST 1 VIEW COMPARISON:  06/08/2018 FINDINGS: Lungs are somewhat hypoinflated without consolidation or effusion. Borderline stable cardiomegaly. Remainder of the exam is unchanged. IMPRESSION: No acute cardiopulmonary disease. Electronically Signed   By: Marin Olp M.D.   On: 08/18/2019 10:38    Cardiac Studies  Echo August 02, 2019  1. Left ventricular ejection fraction, by visual estimation, is 55 to 60%. The left ventricle has normal function. Left ventricular septal wall thickness was moderately increased. Moderately increased left ventricular posterior wall thickness. There is  moderately increased left ventricular hypertrophy.  2. Left ventricular diastolic parameters are consistent with Grade I diastolic dysfunction (impaired relaxation).  3. Global right ventricle has normal systolic function.The right ventricular size is normal. No increase in right ventricular wall thickness.  4. Left atrial size was severely dilated.  5. Right atrial size was normal.  6. The mitral valve is normal in structure. Trace mitral valve regurgitation. No evidence of mitral stenosis.  7. The tricuspid valve is normal in structure. Tricuspid valve regurgitation is not demonstrated.  8. The  aortic valve is normal in structure. Aortic valve regurgitation is trivial. No evidence of aortic valve sclerosis or stenosis.  9. The pulmonic valve was normal in structure. Pulmonic valve regurgitation is not visualized. 10. TR signal is inadequate for assessing pulmonary artery systolic pressure. 11. The inferior vena cava is dilated in size with <50% respiratory variability, suggesting right atrial pressure of 15 mmHg.  Patient Profile     68 year old gentleman with history of obstructive sleep apnea, hypertension, palpitations presenting with increased heart rates.  Found to have SVT now resolved after amiodarone bolus of 150 mg IV.  Assessment & Plan     1. SVT Noted on EKG and arrival Very symptomatic, lasting for hours before broke with amiodarone infusion Reports this is fourth episode in the past 6 months, getting longer, will not resolve with maneuvers Last echocardiogram with no structural abnormalities. Started on amiodarone in the emergency room transition to oral pill --Long discussion with him concerning his medications, Given start of amiodarone and his concern for bradycardia, Suggested we decrease diltiazem extended release down to 120 mg daily,  --he has been taking metoprolol succinate 50 in the morning 20 5 at night----we will decrease this down to metoprolol succinate 50 daily.  --Would continue amiodarone 400 mg twice daily for 4 days then down to 200 twice daily.  He has follow-up with EP.  At that time could consider decreasing amiodarone down to 200 daily --He is concerned about symptomatic PVCs,.  He might get relief from SVT and his PVCs with amiodarone  2.  Hypertension Reports blood pressure has continued to run high even at home prior to admission --Recommend to increase irbesartan up to 300 mg daily  3.  Symptomatic PVCs Continue metoprolol succinate 50 as above, Also now on amiodarone As follow-up with EP  4.  Possible sleep apnea Primary care  setting up a sleep study  Long discussion with him concerning recent SVT episodes, PVCs, management of hypertension, medication changes as above discussed with him in detail, also discussed ablation if felt appropriate by EP, dust pending sleep study  Total encounter time more than 45 minutes  Greater than 50% was spent in counseling and coordination of care with the patient    For questions or updates, please contact [facility] Please consult [email or web address] for contact info under        Signed, Ida Rogue, MD  08/19/2019, 11:45 AM

## 2019-08-23 ENCOUNTER — Telehealth: Payer: PPO | Admitting: Internal Medicine

## 2019-08-23 ENCOUNTER — Other Ambulatory Visit: Payer: Self-pay

## 2019-08-23 ENCOUNTER — Telehealth (INDEPENDENT_AMBULATORY_CARE_PROVIDER_SITE_OTHER): Payer: PPO | Admitting: Internal Medicine

## 2019-08-23 DIAGNOSIS — I493 Ventricular premature depolarization: Secondary | ICD-10-CM

## 2019-08-23 DIAGNOSIS — G4733 Obstructive sleep apnea (adult) (pediatric): Secondary | ICD-10-CM | POA: Diagnosis not present

## 2019-08-23 NOTE — Progress Notes (Signed)
Being fitted for CPAP and wishes to move appointment to another day per EP CMA.

## 2019-08-24 DIAGNOSIS — I10 Essential (primary) hypertension: Secondary | ICD-10-CM | POA: Diagnosis not present

## 2019-08-24 DIAGNOSIS — I471 Supraventricular tachycardia: Secondary | ICD-10-CM | POA: Diagnosis not present

## 2019-08-25 ENCOUNTER — Other Ambulatory Visit: Payer: Self-pay

## 2019-08-25 ENCOUNTER — Telehealth (INDEPENDENT_AMBULATORY_CARE_PROVIDER_SITE_OTHER): Payer: PPO | Admitting: Internal Medicine

## 2019-08-25 ENCOUNTER — Encounter: Payer: Self-pay | Admitting: Internal Medicine

## 2019-08-25 ENCOUNTER — Telehealth: Payer: Self-pay | Admitting: *Deleted

## 2019-08-25 VITALS — BP 174/87 | HR 69 | Ht 72.0 in | Wt 222.0 lb

## 2019-08-25 DIAGNOSIS — G4733 Obstructive sleep apnea (adult) (pediatric): Secondary | ICD-10-CM | POA: Diagnosis not present

## 2019-08-25 DIAGNOSIS — I493 Ventricular premature depolarization: Secondary | ICD-10-CM

## 2019-08-25 DIAGNOSIS — E669 Obesity, unspecified: Secondary | ICD-10-CM | POA: Diagnosis not present

## 2019-08-25 DIAGNOSIS — I471 Supraventricular tachycardia: Secondary | ICD-10-CM | POA: Diagnosis not present

## 2019-08-25 MED ORDER — AMIODARONE HCL 200 MG PO TABS: 200.0000 mg | ORAL_TABLET | Freq: Every day | ORAL | 3 refills | Status: DC

## 2019-08-25 NOTE — Telephone Encounter (Signed)
-----   Message from Thompson Grayer, MD sent at 08/25/2019 10:51 AM EST ----- 3 months with me  Reduce amiodarone to 200mg  daily on 09/10/2019

## 2019-08-25 NOTE — Telephone Encounter (Signed)
Will forward to scheduler to arrange follow up

## 2019-08-25 NOTE — Progress Notes (Signed)
Electrophysiology TeleHealth Note   Due to national recommendations of social distancing due to [place] 19, Audio telehealth visit is felt to be most appropriate for this patient at this time.  See MyChart message from today for patient consent regarding telehealth for [facility].   Date:  08/25/2019   ID:  Jerry Ruiz, DOB Oct 28, 1950, MRN [id number]  Location: home Provider location: [place] Evaluation Performed: New patient consult  PCP:  Lajean Manes, MD  Cardiologist:  Larae Grooms, MD  Electrophysiologist:  None   Chief Complaint:  SVT  History of Present Illness:    Jerry Ruiz is a 68 y.o. male who presents via audio/video conferencing for a telehealth visit today.   The patient is referred for new consultation regarding SVT by Dr Irish Lack.  The patient was recently hospitalized at [facility] with wide complex tachycardia.  He has bifascicular block at baseline.  He also has had chronic PVCs.  He was started by Dr Rockey Situ on amiodarone for presumed SVT. The patient is doing well at this time. He reports that his tachypalpitations were initiated by alcohol (though he drinks rarely).  He has decided to avoid alcohol going forward. He has severe LA enlargement but no history of afib. He has concern for sleep apnea and has a sleep study pending.  He has a h/o PVC.  He notices these frequently. He has had PVCs since 1979.  These wax and wane intermittently.  He has been treated with toprol previously with some success.  Today, he denies symptoms of chest pain, shortness of breath, orthopnea, PND, lower extremity edema, claudication, dizziness, presyncope, syncope, bleeding, or neurologic sequela. The patient is tolerating medications without difficulties and is otherwise without complaint today.     Past Medical History:  Diagnosis Date  . Bifascicular block   . History of echocardiogram    a. Echo 12/15: EF 55-60%, no RWMA, mild LAE, trivial MR  . History of  echocardiogram    Echo 12/17: mod conc LVH, EF 55-60, no RWMA, Gr 1 DD, mild AI, calcified AV leaflets, trivial MR, mod LAE, normal RVSF, mild TR, no pericardial eff  . HTN (hypertension)   . PVC's (premature ventricular contractions)     Past Surgical History:  Procedure Laterality Date  . HEMORROIDECTOMY    . KNEE SURGERY    . SHOULDER SURGERY      Current Outpatient Medications  Medication Sig Dispense Refill  . amiodarone (PACERONE) 400 MG tablet Take 200 mg by mouth 2 (two) times daily.    Marland Kitchen diltiazem (CARDIZEM SR) 120 MG 12 hr capsule Take 120 mg by mouth daily.    Marland Kitchen FISH OIL-COENZYME Q10 PO Take 1 capsule by mouth daily. PATIENT NOT SURE OF CURRENT DOSAGE    . GARLIC PO Take 0000000 mg by mouth daily.    . irbesartan (AVAPRO) 300 MG tablet Take 1 tablet (300 mg total) by mouth daily. 30 tablet 0  . metoprolol succinate (TOPROL-XL) 50 MG 24 hr tablet Take 1 tablet (50 mg total) by mouth daily. Take 50 mg in the am and 25mg  in the pm by mouth daily. 30 tablet 0  . Multiple Vitamin (MULTIVITAMIN) capsule Take 1 capsule by mouth daily.    [name] Leaf Extract 250 MG CAPS Take 450 mg by mouth as needed (immune support).     . Probiotic Product (PROBIOTIC DAILY PO) Take 1 tablet by mouth daily.     No current facility-administered medications for this  visit.    Allergies:   Amlodipine and Sulfa antibiotics   Social History:  The patient  reports that he has quit smoking. He has never used smokeless tobacco. He reports current alcohol use.   Family History:  The patient's family history includes Arrhythmia in his mother; Heart disease in his mother; Hypertension in his father and mother; Parkinson's disease in his father.    ROS:  Please see the history of present illness.   All other systems are personally reviewed and negative.    Exam:    Vital Signs:  BP (!) 174/87   Pulse 69   Ht 6' (1.829 m)   Wt 222 lb (100.7 kg)   BMI 30.11 kg/m   Well sounding, alert and conversant     Labs/Other Tests and Data Reviewed:    Recent Labs: 08/18/2019: B Natriuretic Peptide 66.0; Magnesium 2.1; TSH 2.055 08/19/2019: BUN 19; Creatinine, Ser 1.05; Hemoglobin 15.5; Platelets 257; Potassium 3.8; Sodium 139   Wt Readings from Last 3 Encounters:  08/25/19 222 lb (100.7 kg)  08/19/19 225 lb (102.1 kg)  08/19/19 225 lb (102.1 kg)     Other studies personally reviewed: Additional studies/ records that were reviewed today include: Dr Morton Amy notes,  Dr Gwenyth Ober notes, prior echo and ekgs  Review of the above records today demonstrates: as above  ASSESSMENT & PLAN:    1.  Wide complex tachycardia Given bifascicular block at baseline, I suspect that his arrhythmia was SVT with aberrancy.  I cannot completely exclude focal VT.  He has a normal EF.   He is doing well with amiodarone currently.  Reduce amiodarone to 200mg  daily in 2-3 weeks. Given severe LA enlargement, bifascicular block, and PVCs I am not excited about ablation for him long term.    2. PVCs Intermittent Not likely amenable to ablation given infrequent nature Continue amiodarone for now  3. Severe LA enlargement May eventually have afib, though no afib to date  4. OSA Use of CPAP is advised  5. Obesity Weight loss is advised  6. HTN We discussed lifestyle modification at length. Aggressive lifestyle modification is advised Medical management is also advised  7. Bifascicular block Careful with AV nodal agents  Reduce amiodarone to 200mg  daily on 09/10/2019  Follow-up:  With Dr Irish Lack as schedule  Current medicines are reviewed at length with the patient today.   The patient does not have concerns regarding his medicines.  The following changes were made today:  none  Labs/ tests ordered today include:  No orders of the defined types were placed in this encounter.   Patient Risk:  after full review of this patients clinical status, I feel that they are at moderate risk at this  time.   Today, I have spent 25 minutes with the patient with telehealth technology discussing tachycardia .    Signed, Thompson Grayer MD, [place] 08/25/2019 10:47 AM   [facility] [address] [place] [address] [phone] (office) [phone] (fax)

## 2019-09-06 DIAGNOSIS — H0015 Chalazion left lower eyelid: Secondary | ICD-10-CM | POA: Diagnosis not present

## 2019-09-07 DIAGNOSIS — I10 Essential (primary) hypertension: Secondary | ICD-10-CM | POA: Diagnosis not present

## 2019-09-07 DIAGNOSIS — J42 Unspecified chronic bronchitis: Secondary | ICD-10-CM | POA: Diagnosis not present

## 2019-09-14 DIAGNOSIS — I10 Essential (primary) hypertension: Secondary | ICD-10-CM | POA: Diagnosis not present

## 2019-09-14 DIAGNOSIS — I499 Cardiac arrhythmia, unspecified: Secondary | ICD-10-CM | POA: Diagnosis not present

## 2019-09-14 DIAGNOSIS — I471 Supraventricular tachycardia: Secondary | ICD-10-CM | POA: Diagnosis not present

## 2019-09-15 DIAGNOSIS — G4733 Obstructive sleep apnea (adult) (pediatric): Secondary | ICD-10-CM | POA: Insufficient documentation

## 2019-09-15 DIAGNOSIS — R000 Tachycardia, unspecified: Secondary | ICD-10-CM | POA: Insufficient documentation

## 2019-09-15 DIAGNOSIS — I472 Ventricular tachycardia: Secondary | ICD-10-CM | POA: Insufficient documentation

## 2019-09-15 NOTE — Progress Notes (Signed)
Cardiology Office Note    Date:  09/21/2019   ID:  Jerry Ruiz, DOB 11-Mar-1951, MRN [id number]  PCP:  Lajean Manes, MD  Cardiologist: Larae Grooms, MD EPS: None  Chief Complaint  Patient presents with  . Follow-up    History of Present Illness:  Jerry Ruiz is a 69 y.o. male with history of pericardial cyst noted on prior CT in 2013, echoes have not demonstrated pericardial cyst, hypertension-difficult to control with fatigue and arthralgias on Hyzaar, PVCs, obesity, was seen in the hospital at [facility] 08/2019 with wide-complex tachycardia (bifascicular block at baseline) presumed to be SVT and started on amiodarone.  Follow-up 08/25/2019 with Dr. Rayann Heman he suspected the arrhythmia was SVT with aberrancy but cannot completely exclude focal VT.  He reduced his amiodarone to 200 mg daily.  Given his severe left atrial enlargement and bifascicular block with PVCs he did not want to ablate him.  Patient had a misunderstanding and is on Amiodarone 100 mg daily. He said his HR at rest in 50's but gets up to 70-80's with activity. Walking yesterday HR jumped to 160/m. More tired and some nausea. Occasional ankle swelling at night. BP up. Watches salt at home but eats out 3 times/week-some fast food, [place]. Has had an essential tremor before but says it's twice as bad since amiodarone.   Past Medical History:  Diagnosis Date  . Bifascicular block   . History of echocardiogram    a. Echo 12/15: EF 55-60%, no RWMA, mild LAE, trivial MR  . History of echocardiogram    Echo 12/17: mod conc LVH, EF 55-60, no RWMA, Gr 1 DD, mild AI, calcified AV leaflets, trivial MR, mod LAE, normal RVSF, mild TR, no pericardial eff  . HTN (hypertension)   . PVC's (premature ventricular contractions)     Past Surgical History:  Procedure Laterality Date  . HEMORROIDECTOMY    . KNEE SURGERY    . SHOULDER SURGERY      Current Medications: Current Meds  Medication Sig  . amiodarone (PACERONE) 200  MG tablet Take 1 tablet (200 mg total) by mouth daily.  Marland Kitchen diltiazem (CARDIZEM SR) 120 MG 12 hr capsule Take 120 mg by mouth daily.  Marland Kitchen FISH OIL-COENZYME Q10 PO Take 1 capsule by mouth daily. PATIENT NOT SURE OF CURRENT DOSAGE  . GARLIC PO Take 0000000 mg by mouth daily.  . irbesartan (AVAPRO) 300 MG tablet Take 1 tablet (300 mg total) by mouth daily.  . metoprolol succinate (TOPROL-XL) 50 MG 24 hr tablet Take 50 mg by mouth daily. Take with or immediately following a meal.  . Multiple Vitamin (MULTIVITAMIN) capsule Take 1 capsule by mouth daily.  . Probiotic Product (PROBIOTIC DAILY PO) Take 1 tablet by mouth daily.  . [DISCONTINUED] amiodarone (PACERONE) 200 MG tablet Take 100 mg by mouth daily.     Allergies:   Amlodipine and Sulfa antibiotics   Social History   Socioeconomic History  . Marital status: Single    Spouse name: Not on file  . Number of children: Not on file  . Years of education: Not on file  . Highest education level: Not on file  Occupational History  . Not on file  Tobacco Use  . Smoking status: Former Research scientist (life sciences)  . Smokeless tobacco: Never Used  Substance and Sexual Activity  . Alcohol use: Yes    Comment: 2 beers per month  . Drug use: Not on file  . Sexual activity: Not on file  Other Topics  Concern  . Not on file  Social History Narrative   Lives in [place] with spouse.   Retired Dealer for [employer].   Social Determinants of Health   Financial Resource Strain:   . Difficulty of Paying Living Expenses: Not on file  Food Insecurity:   . Worried About Charity fundraiser in the Last Year: Not on file  . Ran Out of Food in the Last Year: Not on file  Transportation Needs:   . Lack of Transportation (Medical): Not on file  . Lack of Transportation (Non-Medical): Not on file  Physical Activity:   . Days of Exercise per Week: Not on file  . Minutes of Exercise per Session: Not on file  Stress:   . Feeling of Stress : Not on file  Social Connections:   .  Frequency of Communication with Friends and Family: Not on file  . Frequency of Social Gatherings with Friends and Family: Not on file  . Attends Religious Services: Not on file  . Active Member of Clubs or Organizations: Not on file  . Attends Archivist Meetings: Not on file  . Marital Status: Not on file     Family History:  The patient's   family history includes Arrhythmia in his mother; Heart disease in his mother; Hypertension in his father and mother; Parkinson's disease in his father.   ROS:   Please see the history of present illness.    ROS All other systems reviewed and are negative.   PHYSICAL EXAM:   VS:  BP (!) 152/86   Pulse (!) 59   Ht 6' (1.829 m)   Wt 234 lb (106.1 kg)   SpO2 97%   BMI 31.74 kg/m   Physical Exam  GEN: Well nourished, well developed, in no acute distress  Neck: Right carotid bruit no JVD,  or masses Cardiac:RRR; 1 or 6 systolic murmur at the left sternal border Respiratory:  clear to auscultation bilaterally, normal work of breathing GI: soft, nontender, nondistended, + BS Ext: without cyanosis, clubbing, or edema, Good distal pulses bilaterally Neuro:  Alert and Oriented x 3 Psych: euthymic mood, full affect  Wt Readings from Last 3 Encounters:  09/21/19 234 lb (106.1 kg)  08/25/19 222 lb (100.7 kg)  08/19/19 225 lb (102.1 kg)      Studies/Labs Reviewed:   EKG:  EKG is not ordered today.   Recent Labs: 08/18/2019: B Natriuretic Peptide 66.0; Magnesium 2.1; TSH 2.055 08/19/2019: BUN 19; Creatinine, Ser 1.05; Hemoglobin 15.5; Platelets 257; Potassium 3.8; Sodium 139   Lipid Panel    Component Value Date/Time   CHOL 153 05/24/2014 0927   TRIG 89 05/24/2014 0927   HDL 30 (L) 05/24/2014 0927   CHOLHDL 5.1 05/24/2014 0927   VLDL 18 05/24/2014 0927   LDLCALC 105 (H) 05/24/2014 0927    Additional studies/ records that were reviewed today include:  2D echo 11/23/2020IMPRESSIONS      1. Left ventricular ejection  fraction, by visual estimation, is 55 to 60%. The left ventricle has normal function. Left ventricular septal wall thickness was moderately increased. Moderately increased left ventricular posterior wall thickness. There is  moderately increased left ventricular hypertrophy.  2. Left ventricular diastolic parameters are consistent with Grade I diastolic dysfunction (impaired relaxation).  3. Global right ventricle has normal systolic function.The right ventricular size is normal. No increase in right ventricular wall thickness.  4. Left atrial size was severely dilated.  5. Right atrial size was normal.  6. The mitral valve is normal in structure. Trace mitral valve regurgitation. No evidence of mitral stenosis.  7. The tricuspid valve is normal in structure. Tricuspid valve regurgitation is not demonstrated.  8. The aortic valve is normal in structure. Aortic valve regurgitation is trivial. No evidence of aortic valve sclerosis or stenosis.  9. The pulmonic valve was normal in structure. Pulmonic valve regurgitation is not visualized. 10. TR signal is inadequate for assessing pulmonary artery systolic pressure. 11. The inferior vena cava is dilated in size with <50% respiratory variability, suggesting right atrial pressure of 15 mmHg.       ASSESSMENT:    1. PVC's (premature ventricular contractions)   2. Wide-complex tachycardia ([place])   3. Essential hypertension   4. Morbid obesity ([place])   5. OSA (obstructive sleep apnea)   6. Right carotid bruit   7. Essential tremor      PLAN:  In order of problems listed above:  Wide-complex tachycardia suspected SVT with aberrancy per Dr. Rayann Heman cannot completely exclude focal VT but normal LV function.  Bifascicular block so careful with AV nodal agents.  Patient misunderstood and was taking amiodarone 100 mg daily.  During his walk yesterday his heart rate jumped to 160 bpm.  I asked him to increase amiodarone back to 200 mg daily.  He will  keep an eye on his pulse and call us if it drops below 50.  We can always reduce his diltiazem or metoprolol.  Keep follow-up with Dr. Rayann Heman in March.     PVCs intermittent not likely amenable to ablation  Hypertension has been difficult to control in the past-needs to reduce sodium in diet and weight loss. Add Chlorthalidone 25 mg 1/2 tablet daily.  Obesity needs to increase exercise and weight loss  OSA on CPAP-started recently  Right carotid bruit check carotid Dopplers  Tremor-had before but says it's twice as bad since he started amiodarone. Will send to Dr. Rayann Heman for advice.  Medication Adjustments/Labs and Tests Ordered: Current medicines are reviewed at length with the patient today.  Concerns regarding medicines are outlined above.  Medication changes, Labs and Tests ordered today are listed in the Patient Instructions below. Patient Instructions   Medication Instructions:  Your physician has recommended you make the following change in your medication:   1. INCREASE: amiodarone to 200 mg once a day  2. START: chlorthalidone 25 mg tablet: Take 1/2 tablet (12.5 mg) by mouth once a day  If you need a refill on your cardiac medications before your next appointment, please call your pharmacy.   Lab work: None Ordered  If you have labs (blood work) drawn today and your tests are completely normal, you will receive your results only by: Marland Kitchen MyChart Message (if you have MyChart) OR . A paper copy in the mail If you have any lab test that is abnormal or we need to change your treatment, we will call you to review the results.  Testing/Procedures: Your physician has requested that you have a carotid duplex. This test is an ultrasound of the carotid arteries in your neck. It looks at blood flow through these arteries that supply the brain with blood. Allow one hour for this exam. There are no restrictions or special instructions.  Follow-Up: Keep appointments with Dr. Irish Lack  and Dr. Rayann Heman  Any Other Special Instructions Will Be Listed Below (If Applicable).  Your provider recommends that you maintain 150 minutes per week of moderate aerobic activity.  Your physician encouraged you  to lose weight for better health.  Two Gram Sodium Diet 2000 mg  What is Sodium? Sodium is a mineral found naturally in many foods. The most significant source of sodium in the diet is table salt, which is about 40% sodium.  Processed, convenience, and preserved foods also contain a large amount of sodium.  The body needs only 500 mg of sodium daily to function,  A normal diet provides more than enough sodium even if you do not use salt.  Why Limit Sodium? A build up of sodium in the body can cause thirst, increased blood pressure, shortness of breath, and water retention.  Decreasing sodium in the diet can reduce edema and risk of heart attack or stroke associated with high blood pressure.  Keep in mind that there are many other factors involved in these health problems.  Heredity, obesity, lack of exercise, cigarette smoking, stress and what you eat all play a role.  General Guidelines:  Do not add salt at the table or in cooking.  One teaspoon of salt contains over 2 grams of sodium.  Read food labels  Avoid processed and convenience foods  Ask your dietitian before eating any foods not dicussed in the menu planning guidelines  Consult your physician if you wish to use a salt substitute or a sodium containing medication such as antacids.  Limit milk and milk products to 16 oz (2 cups) per day.  Shopping Hints:  READ LABELS!! "Dietetic" does not necessarily mean low sodium.  Salt and other sodium ingredients are often added to foods during processing.   Menu Planning Guidelines Food Group Choose More Often Avoid  Beverages (see also the milk group All fruit juices, low-sodium, salt-free vegetables juices, low-sodium carbonated beverages Regular vegetable or tomato juices,  commercially softened water used for drinking or cooking  Breads and Cereals Enriched white, wheat, rye and pumpernickel bread, hard rolls and dinner rolls; muffins, cornbread and waffles; most dry cereals, cooked cereal without added salt; unsalted crackers and breadsticks; low sodium or homemade bread crumbs Bread, rolls and crackers with salted tops; quick breads; instant hot cereals; pancakes; commercial bread stuffing; self-rising flower and biscuit mixes; regular bread crumbs or cracker crumbs  Desserts and Sweets Desserts and sweets mad with mild should be within allowance Instant pudding mixes and cake mixes  Fats Butter or margarine; vegetable oils; unsalted salad dressings, regular salad dressings limited to 1 Tbs; light, sour and heavy cream Regular salad dressings containing bacon fat, bacon bits, and salt pork; snack dips made with instant soup mixes or processed cheese; salted nuts  Fruits Most fresh, frozen and canned fruits Fruits processed with salt or sodium-containing ingredient (some dried fruits are processed with sodium sulfites        Vegetables Fresh, frozen vegetables and low- sodium canned vegetables Regular canned vegetables, sauerkraut, pickled vegetables, and others prepared in brine; frozen vegetables in sauces; vegetables seasoned with ham, bacon or salt pork  Condiments, Sauces, Miscellaneous  Salt substitute with physician's approval; pepper, herbs, spices; vinegar, lemon or lime juice; hot pepper sauce; garlic powder, onion powder, low sodium soy sauce (1 Tbs.); low sodium condiments (ketchup, chili sauce, mustard) in limited amounts (1 tsp.) fresh ground horseradish; unsalted tortilla chips, pretzels, potato chips, popcorn, salsa (1/4 cup) Any seasoning made with salt including garlic salt, celery salt, onion salt, and seasoned salt; sea salt, rock salt, kosher salt; meat tenderizers; monosodium glutamate; mustard, regular soy sauce, barbecue, sauce, chili sauce,  teriyaki sauce, steak sauce, Worcestershire sauce,  and most flavored vinegars; canned gravy and mixes; regular condiments; salted snack foods, olives, picles, relish, horseradish sauce, catsup   Food preparation: Try these seasonings Meats:    Pork Sage, onion Serve with applesauce  Chicken Poultry seasoning, thyme, parsley Serve with cranberry sauce  Lamb Curry powder, rosemary, garlic, thyme Serve with mint sauce or jelly  Veal Marjoram, basil Serve with current jelly, cranberry sauce  Beef Pepper, bay leaf Serve with dry mustard, unsalted chive butter  Fish [place] leaf, dill Serve with unsalted lemon butter, unsalted parsley butter  Vegetables:    Asparagus Lemon juice   Broccoli Lemon juice   Carrots Mustard dressing parsley, mint, nutmeg, glazed with unsalted butter and sugar   Green beans Marjoram, lemon juice, nutmeg,dill seed   Tomatoes Basil, marjoram, onion   Spice /blend for [employer]" 4 tsp ground thyme 1 tsp ground sage 3 tsp ground rosemary 4 tsp ground marjoram   Test your knowledge 1. A product that says "Salt Free" may still contain sodium. True or False 2. Garlic Powder and Hot Pepper Sauce an be used as alternative seasonings.True or False 3. Processed foods have more sodium than fresh foods.  True or False 4. Canned Vegetables have less sodium than froze True or False  WAYS TO DECREASE YOUR SODIUM INTAKE 1. Avoid the use of added salt in cooking and at the table.  Table salt (and other prepared seasonings which contain salt) is probably one of the greatest sources of sodium in the diet.  Unsalted foods can gain flavor from the sweet, sour, and butter taste sensations of herbs and spices.  Instead of using salt for seasoning, try the following seasonings with the foods listed.  Remember: how you use them to enhance natural food flavors is limited only by your creativity... Allspice-Meat, fish, eggs, fruit, peas, red and yellow vegetables Almond Extract-Fruit baked  goods Anise Seed-Sweet breads, fruit, carrots, beets, cottage cheese, cookies (tastes like licorice) Basil-Meat, fish, eggs, vegetables, rice, vegetables salads, soups, sauces Bay Leaf-Meat, fish, stews, poultry Burnet-Salad, vegetables (cucumber-like flavor) Caraway Seed-Bread, cookies, cottage cheese, meat, vegetables, cheese, rice Cardamon-Baked goods, fruit, soups Celery Powder or seed-Salads, salad dressings, sauces, meatloaf, soup, bread.Do not use  celery salt Chervil-Meats, salads, fish, eggs, vegetables, cottage cheese (parsley-like flavor) Chili Power-Meatloaf, chicken cheese, corn, eggplant, egg dishes Chives-Salads cottage cheese, egg dishes, soups, vegetables, sauces Cilantro-Salsa, casseroles Cinnamon-Baked goods, fruit, pork, lamb, chicken, carrots Cloves-Fruit, baked goods, fish, pot roast, green beans, beets, carrots Coriander-Pastry, cookies, meat, salads, cheese (lemon-orange flavor) Cumin-Meatloaf, fish,cheese, eggs, cabbage,fruit pie (caraway flavor) [employer], fruit, eggs, fish, poultry, cottage cheese, vegetables Dill Seed-Meat, cottage cheese, poultry, vegetables, fish, salads, bread Fennel Seed-Bread, cookies, apples, pork, eggs, fish, beets, cabbage, cheese, Licorice-like flavor Garlic-(buds or powder) Salads, meat, poultry, fish, bread, butter, vegetables, potatoes.Do not  use garlic salt Ginger-Fruit, vegetables, baked goods, meat, fish, poultry Horseradish Root-Meet, vegetables, butter Lemon Juice or Extract-Vegetables, fruit, tea, baked goods, fish salads Mace-Baked goods fruit, vegetables, fish, poultry (taste like nutmeg) Maple Extract-Syrups Marjoram-Meat, chicken, fish, vegetables, breads, green salads (taste like Sage) Mint-Tea, lamb, sherbet, vegetables, desserts, carrots, cabbage Mustard, Dry or Seed-Cheese, eggs, meats, vegetables, poultry Nutmeg-Baked goods, fruit, chicken, eggs, vegetables, desserts Onion Powder-Meat, fish, poultry,  vegetables, cheese, eggs, bread, rice salads (Do not use   Onion salt) Orange Extract-Desserts, baked goods Oregano-Pasta, eggs, cheese, onions, pork, lamb, fish, chicken, vegetables, green salads Paprika-Meat, fish, poultry, eggs, cheese, vegetables Parsley Flakes-Butter, vegetables, meat fish, poultry, eggs, bread, salads (certain forms may  Contain sodium Pepper-Meat fish, poultry, vegetables, eggs Peppermint Extract-Desserts, baked goods Poppy Seed-Eggs, bread, cheese, fruit dressings, baked goods, noodles, vegetables, cottage  [employer], poultry, meat, fish, cauliflower, turnips,eggs bread Saffron-Rice, bread, veal, chicken, fish, eggs Sage-Meat, fish, poultry, onions, eggplant, tomateos, pork, stews Savory-Eggs, salads, poultry, meat, rice, vegetables, soups, pork Tarragon-Meat, poultry, fish, eggs, butter, vegetables (licorice-like flavor)  Thyme-Meat, poultry, fish, eggs, vegetables, (clover-like flavor), sauces, soups Tumeric-Salads, butter, eggs, fish, rice, vegetables (saffron-like flavor) Vanilla Extract-Baked goods, candy Vinegar-Salads, vegetables, meat marinades Walnut Extract-baked goods, candy  2. Choose your Foods Wisely   The following is a list of foods to avoid which are high in sodium:  Meats-Avoid all smoked, canned, salt cured, dried and kosher meat and fish as well as Anchovies   [employer] meats:Bologna, Liverwurst, Pastrami Canned meat or fish  Marinated herring Caviar    Pepperoni Corned Beef   Pizza Dried chipped beef  Salami Frozen breaded fish or meat Salt pork Frankfurters or hot dogs  Sardines Gefilte fish   Sausage Ham (boiled ham, Proscuitto Smoked butt    spiced ham)   Spam      TV Dinners Vegetables Canned vegetables (Regular) Relish Canned mushrooms  Sauerkraut Olives    Tomato juice Pickles  Bakery and Dessert Products Canned puddings  Cream pies Cheesecake   Decorated  cakes Cookies  Beverages/Juices Tomato juice, regular  Gatorade   V-8 vegetable juice, regular  Breads and Cereals Biscuit mixes   Salted potato chips, corn chips, pretzels Bread stuffing mixes  Salted crackers and rolls Pancake and waffle mixes Self-rising flour  Seasonings Accent    Meat sauces Barbecue sauce  Meat tenderizer Catsup    Monosodium glutamate (MSG) Celery salt   Onion salt Chili sauce   Prepared mustard Garlic salt   Salt, seasoned salt, sea salt Gravy mixes   Soy sauce Horseradish   Steak sauce Ketchup   Tartar sauce Lite salt    Teriyaki sauce Marinade mixes   Worcestershire sauce  Others Baking powder   Cocoa and cocoa mixes Baking soda   Commercial casserole mixes Candy-caramels, chocolate  Dehydrated soups    Bars, fudge,nougats  Instant rice and pasta mixes Canned broth or soup  Maraschino cherries Cheese, aged and processed cheese and cheese spreads  Learning Assessment Quiz  Indicated T (for True) or F (for False) for each of the following statements:  1. _____ Fresh fruits and vegetables and unprocessed grains are generally low in sodium 2. _____ Water may contain a considerable amount of sodium, depending on the source 3. _____ You can always tell if a food is high in sodium by tasting it 4. _____ Certain laxatives my be high in sodium and should be avoided unless prescribed   by a physician or pharmacist 5. _____ Salt substitutes may be used freely by anyone on a sodium restricted diet 6. _____ Sodium is present in table salt, food additives and as a natural component of   most foods 7. _____ Table salt is approximately 90% sodium 8. _____ Limiting sodium intake may help prevent excess fluid accumulation in the body 9. _____ On a sodium-restricted diet, seasonings such as bouillon soy sauce, and    cooking wine should be used in place of table salt 10. _____ On an ingredient list, a product which lists monosodium glutamate as the first    ingredient is an appropriate food to include on a low sodium diet  Circle the best answer(s)  to the following statements (Hint: there may be more than one correct answer)  11. On a low-sodium diet, some acceptable snack items are:    A. Olives  F. Bean dip   K. Grapefruit juice    B. Salted Pretzels G. Commercial Popcorn   L. Canned peaches    C. Carrot Sticks  H. Bouillon   M. Unsalted nuts   D. [place] fries  I. Peanut butter crackers N. Salami   E. Sweet pickles J. Tomato Juice   O. Pizza  12.  Seasonings that may be used freely on a reduced - sodium diet include   A. Lemon wedges F.Monosodium glutamate K. Celery seed    B.Soysauce   G. Pepper   L. Mustard powder   C. Sea salt  H. Cooking wine  M. Onion flakes   D. Vinegar  E. Prepared horseradish N. Salsa   E. Sage   J. Worcestershire sauce  O. [address]      Sumner Boast, PA-C  09/21/2019 9:49 AM    [facility] [place], [place],   [address] Phone: [phone]; Fax: [phone]

## 2019-09-21 ENCOUNTER — Other Ambulatory Visit: Payer: Self-pay

## 2019-09-21 ENCOUNTER — Encounter: Payer: Self-pay | Admitting: Physician Assistant

## 2019-09-21 ENCOUNTER — Ambulatory Visit (INDEPENDENT_AMBULATORY_CARE_PROVIDER_SITE_OTHER): Payer: PPO | Admitting: Physician Assistant

## 2019-09-21 VITALS — BP 152/86 | HR 59 | Ht 72.0 in | Wt 234.0 lb

## 2019-09-21 DIAGNOSIS — I493 Ventricular premature depolarization: Secondary | ICD-10-CM

## 2019-09-21 DIAGNOSIS — R0989 Other specified symptoms and signs involving the circulatory and respiratory systems: Secondary | ICD-10-CM | POA: Diagnosis not present

## 2019-09-21 DIAGNOSIS — I10 Essential (primary) hypertension: Secondary | ICD-10-CM

## 2019-09-21 DIAGNOSIS — R000 Tachycardia, unspecified: Secondary | ICD-10-CM

## 2019-09-21 DIAGNOSIS — G4733 Obstructive sleep apnea (adult) (pediatric): Secondary | ICD-10-CM

## 2019-09-21 DIAGNOSIS — G250 Essential tremor: Secondary | ICD-10-CM

## 2019-09-21 DIAGNOSIS — I472 Ventricular tachycardia: Secondary | ICD-10-CM | POA: Diagnosis not present

## 2019-09-21 MED ORDER — AMIODARONE HCL 200 MG PO TABS: 200.0000 mg | ORAL_TABLET | Freq: Every day | ORAL | 3 refills | Status: DC

## 2019-09-21 MED ORDER — CHLORTHALIDONE 25 MG PO TABS: 12.5000 mg | ORAL_TABLET | Freq: Every day | ORAL | 3 refills | Status: DC

## 2019-09-21 NOTE — Patient Instructions (Signed)
Medication Instructions:  Your physician has recommended you make the following change in your medication:   1. INCREASE: amiodarone to 200 mg once a day  2. START: chlorthalidone 25 mg tablet: Take 1/2 tablet (12.5 mg) by mouth once a day  If you need a refill on your cardiac medications before your next appointment, please call your pharmacy.   Lab work: None Ordered  If you have labs (blood work) drawn today and your tests are completely normal, you will receive your results only by: Marland Kitchen MyChart Message (if you have MyChart) OR . A paper copy in the mail If you have any lab test that is abnormal or we need to change your treatment, we will call you to review the results.  Testing/Procedures: Your physician has requested that you have a carotid duplex. This test is an ultrasound of the carotid arteries in your neck. It looks at blood flow through these arteries that supply the brain with blood. Allow one hour for this exam. There are no restrictions or special instructions.  Follow-Up: Keep appointments with Dr. Irish Lack and Dr. Rayann Heman  Any Other Special Instructions Will Be Listed Below (If Applicable).  Your provider recommends that you maintain 150 minutes per week of moderate aerobic activity.  Your physician encouraged you to lose weight for better health.  Two Gram Sodium Diet 2000 mg  What is Sodium? Sodium is a mineral found naturally in many foods. The most significant source of sodium in the diet is table salt, which is about 40% sodium.  Processed, convenience, and preserved foods also contain a large amount of sodium.  The body needs only 500 mg of sodium daily to function,  A normal diet provides more than enough sodium even if you do not use salt.  Why Limit Sodium? A build up of sodium in the body can cause thirst, increased blood pressure, shortness of breath, and water retention.  Decreasing sodium in the diet can reduce edema and risk of heart attack or stroke  associated with high blood pressure.  Keep in mind that there are many other factors involved in these health problems.  Heredity, obesity, lack of exercise, cigarette smoking, stress and what you eat all play a role.  General Guidelines:  Do not add salt at the table or in cooking.  One teaspoon of salt contains over 2 grams of sodium.  Read food labels  Avoid processed and convenience foods  Ask your dietitian before eating any foods not dicussed in the menu planning guidelines  Consult your physician if you wish to use a salt substitute or a sodium containing medication such as antacids.  Limit milk and milk products to 16 oz (2 cups) per day.  Shopping Hints:  READ LABELS!! "Dietetic" does not necessarily mean low sodium.  Salt and other sodium ingredients are often added to foods during processing.   Menu Planning Guidelines Food Group Choose More Often Avoid  Beverages (see also the milk group All fruit juices, low-sodium, salt-free vegetables juices, low-sodium carbonated beverages Regular vegetable or tomato juices, commercially softened water used for drinking or cooking  Breads and Cereals Enriched white, wheat, rye and pumpernickel bread, hard rolls and dinner rolls; muffins, cornbread and waffles; most dry cereals, cooked cereal without added salt; unsalted crackers and breadsticks; low sodium or homemade bread crumbs Bread, rolls and crackers with salted tops; quick breads; instant hot cereals; pancakes; commercial bread stuffing; self-rising flower and biscuit mixes; regular bread crumbs or cracker crumbs  Desserts and  Sweets Desserts and sweets mad with mild should be within allowance Instant pudding mixes and cake mixes  Fats Butter or margarine; vegetable oils; unsalted salad dressings, regular salad dressings limited to 1 Tbs; light, sour and heavy cream Regular salad dressings containing bacon fat, bacon bits, and salt pork; snack dips made with instant soup mixes or  processed cheese; salted nuts  Fruits Most fresh, frozen and canned fruits Fruits processed with salt or sodium-containing ingredient (some dried fruits are processed with sodium sulfites        Vegetables Fresh, frozen vegetables and low- sodium canned vegetables Regular canned vegetables, sauerkraut, pickled vegetables, and others prepared in brine; frozen vegetables in sauces; vegetables seasoned with ham, bacon or salt pork  Condiments, Sauces, Miscellaneous  Salt substitute with physician's approval; pepper, herbs, spices; vinegar, lemon or lime juice; hot pepper sauce; garlic powder, onion powder, low sodium soy sauce (1 Tbs.); low sodium condiments (ketchup, chili sauce, mustard) in limited amounts (1 tsp.) fresh ground horseradish; unsalted tortilla chips, pretzels, potato chips, popcorn, salsa (1/4 cup) Any seasoning made with salt including garlic salt, celery salt, onion salt, and seasoned salt; sea salt, rock salt, kosher salt; meat tenderizers; monosodium glutamate; mustard, regular soy sauce, barbecue, sauce, chili sauce, teriyaki sauce, steak sauce, Worcestershire sauce, and most flavored vinegars; canned gravy and mixes; regular condiments; salted snack foods, olives, picles, relish, horseradish sauce, catsup   Food preparation: Try these seasonings Meats:    Pork Sage, onion Serve with applesauce  Chicken Poultry seasoning, thyme, parsley Serve with cranberry sauce  Lamb Curry powder, rosemary, garlic, thyme Serve with mint sauce or jelly  Veal Marjoram, basil Serve with current jelly, cranberry sauce  Beef Pepper, bay leaf Serve with dry mustard, unsalted chive butter  Fish Bay leaf, dill Serve with unsalted lemon butter, unsalted parsley butter  Vegetables:    Asparagus Lemon juice   Broccoli Lemon juice   Carrots Mustard dressing parsley, mint, nutmeg, glazed with unsalted butter and sugar   Green beans Marjoram, lemon juice, nutmeg,dill seed   Tomatoes Basil, marjoram,  onion   Spice /blend for [employer]" 4 tsp ground thyme 1 tsp ground sage 3 tsp ground rosemary 4 tsp ground marjoram   Test your knowledge 1. A product that says "Salt Free" may still contain sodium. True or False 2. Garlic Powder and Hot Pepper Sauce an be used as alternative seasonings.True or False 3. Processed foods have more sodium than fresh foods.  True or False 4. Canned Vegetables have less sodium than froze True or False  WAYS TO DECREASE YOUR SODIUM INTAKE 1. Avoid the use of added salt in cooking and at the table.  Table salt (and other prepared seasonings which contain salt) is probably one of the greatest sources of sodium in the diet.  Unsalted foods can gain flavor from the sweet, sour, and butter taste sensations of herbs and spices.  Instead of using salt for seasoning, try the following seasonings with the foods listed.  Remember: how you use them to enhance natural food flavors is limited only by your creativity... Allspice-Meat, fish, eggs, fruit, peas, red and yellow vegetables Almond Extract-Fruit baked goods Anise Seed-Sweet breads, fruit, carrots, beets, cottage cheese, cookies (tastes like licorice) Basil-Meat, fish, eggs, vegetables, rice, vegetables salads, soups, sauces Bay Leaf-Meat, fish, stews, poultry Burnet-Salad, vegetables (cucumber-like flavor) Caraway Seed-Bread, cookies, cottage cheese, meat, vegetables, cheese, rice Cardamon-Baked goods, fruit, soups Celery Powder or seed-Salads, salad dressings, sauces, meatloaf, soup, bread.Do not use  celery  salt Chervil-Meats, salads, fish, eggs, vegetables, cottage cheese (parsley-like flavor) Chili Power-Meatloaf, chicken cheese, corn, eggplant, egg dishes Chives-Salads cottage cheese, egg dishes, soups, vegetables, sauces Cilantro-Salsa, casseroles Cinnamon-Baked goods, fruit, pork, lamb, chicken, carrots Cloves-Fruit, baked goods, fish, pot roast, green beans, beets, carrots Coriander-Pastry, cookies,  meat, salads, cheese (lemon-orange flavor) Cumin-Meatloaf, fish,cheese, eggs, cabbage,fruit pie (caraway flavor) [employer], fruit, eggs, fish, poultry, cottage cheese, vegetables Dill Seed-Meat, cottage cheese, poultry, vegetables, fish, salads, bread Fennel Seed-Bread, cookies, apples, pork, eggs, fish, beets, cabbage, cheese, Licorice-like flavor Garlic-(buds or powder) Salads, meat, poultry, fish, bread, butter, vegetables, potatoes.Do not  use garlic salt Ginger-Fruit, vegetables, baked goods, meat, fish, poultry Horseradish Root-Meet, vegetables, butter Lemon Juice or Extract-Vegetables, fruit, tea, baked goods, fish salads Mace-Baked goods fruit, vegetables, fish, poultry (taste like nutmeg) Maple Extract-Syrups Marjoram-Meat, chicken, fish, vegetables, breads, green salads (taste like Sage) Mint-Tea, lamb, sherbet, vegetables, desserts, carrots, cabbage Mustard, Dry or Seed-Cheese, eggs, meats, vegetables, poultry Nutmeg-Baked goods, fruit, chicken, eggs, vegetables, desserts Onion Powder-Meat, fish, poultry, vegetables, cheese, eggs, bread, rice salads (Do not use   Onion salt) Orange Extract-Desserts, baked goods Oregano-Pasta, eggs, cheese, onions, pork, lamb, fish, chicken, vegetables, green salads Paprika-Meat, fish, poultry, eggs, cheese, vegetables Parsley Flakes-Butter, vegetables, meat fish, poultry, eggs, bread, salads (certain forms may   Contain sodium Pepper-Meat fish, poultry, vegetables, eggs Peppermint Extract-Desserts, baked goods Poppy Seed-Eggs, bread, cheese, fruit dressings, baked goods, noodles, vegetables, cottage  [employer], poultry, meat, fish, cauliflower, turnips,eggs bread Saffron-Rice, bread, veal, chicken, fish, eggs Sage-Meat, fish, poultry, onions, eggplant, tomateos, pork, stews Savory-Eggs, salads, poultry, meat, rice, vegetables, soups, pork Tarragon-Meat, poultry, fish, eggs, butter, vegetables  (licorice-like flavor)  Thyme-Meat, poultry, fish, eggs, vegetables, (clover-like flavor), sauces, soups Tumeric-Salads, butter, eggs, fish, rice, vegetables (saffron-like flavor) Vanilla Extract-Baked goods, candy Vinegar-Salads, vegetables, meat marinades Walnut Extract-baked goods, candy  2. Choose your Foods Wisely   The following is a list of foods to avoid which are high in sodium:  Meats-Avoid all smoked, canned, salt cured, dried and kosher meat and fish as well as Anchovies   [employer] meats:Bologna, Liverwurst, Pastrami Canned meat or fish  Marinated herring Caviar    Pepperoni Corned Beef   Pizza Dried chipped beef  Salami Frozen breaded fish or meat Salt pork Frankfurters or hot dogs  Sardines Gefilte fish   Sausage Ham (boiled ham, Proscuitto Smoked butt    spiced ham)   Spam      TV Dinners Vegetables Canned vegetables (Regular) Relish Canned mushrooms  Sauerkraut Olives    Tomato juice Pickles  Bakery and Dessert Products Canned puddings  Cream pies Cheesecake   Decorated cakes Cookies  Beverages/Juices Tomato juice, regular  Gatorade   V-8 vegetable juice, regular  Breads and Cereals Biscuit mixes   Salted potato chips, corn chips, pretzels Bread stuffing mixes  Salted crackers and rolls Pancake and waffle mixes Self-rising flour  Seasonings Accent    Meat sauces Barbecue sauce  Meat tenderizer Catsup    Monosodium glutamate (MSG) Celery salt   Onion salt Chili sauce   Prepared mustard Garlic salt   Salt, seasoned salt, sea salt Gravy mixes   Soy sauce Horseradish   Steak sauce Ketchup   Tartar sauce Lite salt    Teriyaki sauce Marinade mixes   Worcestershire sauce  Others Baking powder   Cocoa and cocoa mixes Baking soda   Commercial casserole mixes Candy-caramels, chocolate  Dehydrated soups    Bars, fudge,nougats  Instant rice  and pasta mixes Canned broth or soup  Maraschino cherries Cheese, aged and processed cheese and  cheese spreads  Learning Assessment Quiz  Indicated T (for True) or F (for False) for each of the following statements:  1. _____ Fresh fruits and vegetables and unprocessed grains are generally low in sodium 2. _____ Water may contain a considerable amount of sodium, depending on the source 3. _____ You can always tell if a food is high in sodium by tasting it 4. _____ Certain laxatives my be high in sodium and should be avoided unless prescribed   by a physician or pharmacist 5. _____ Salt substitutes may be used freely by anyone on a sodium restricted diet 6. _____ Sodium is present in table salt, food additives and as a natural component of   most foods 7. _____ Table salt is approximately 90% sodium 8. _____ Limiting sodium intake may help prevent excess fluid accumulation in the body 9. _____ On a sodium-restricted diet, seasonings such as bouillon soy sauce, and    cooking wine should be used in place of table salt 10. _____ On an ingredient list, a product which lists monosodium glutamate as the first   ingredient is an appropriate food to include on a low sodium diet  Circle the best answer(s) to the following statements (Hint: there may be more than one correct answer)  11. On a low-sodium diet, some acceptable snack items are:    A. Olives  F. Bean dip   K. Grapefruit juice    B. Salted Pretzels G. Commercial Popcorn   L. Canned peaches    C. Carrot Sticks  H. Bouillon   M. Unsalted nuts   D. [place] fries  I. Peanut butter crackers N. Salami   E. Sweet pickles J. Tomato Juice   O. Pizza  12.  Seasonings that may be used freely on a reduced - sodium diet include   A. Lemon wedges F.Monosodium glutamate K. Celery seed    B.Soysauce   G. Pepper   L. Mustard powder   C. Sea salt  H. Cooking wine  M. Onion flakes   D. Vinegar  E. Prepared horseradish N. Salsa   E. Sage   J. Worcestershire sauce  O. Chutney

## 2019-09-22 ENCOUNTER — Telehealth: Payer: Self-pay | Admitting: Physician Assistant

## 2019-09-22 NOTE — Progress Notes (Signed)
Can you tell patient that Dr. Rayann Heman said if he thinks amiodarone has made his tremors worse to go back to amiodarone 100 mg daily like he was doing and monitor heart rate and tremors. Please reach out to Dr. Rayann Heman for further concerns on arrhythmias and amiodarone. thanks

## 2019-09-22 NOTE — Telephone Encounter (Signed)
New Message  Pt called and wanted to discuss the medications he was put on yesterday 09/21/19.

## 2019-09-23 DIAGNOSIS — G4733 Obstructive sleep apnea (adult) (pediatric): Secondary | ICD-10-CM | POA: Diagnosis not present

## 2019-09-23 DIAGNOSIS — I10 Essential (primary) hypertension: Secondary | ICD-10-CM | POA: Diagnosis not present

## 2019-09-23 DIAGNOSIS — J42 Unspecified chronic bronchitis: Secondary | ICD-10-CM | POA: Diagnosis not present

## 2019-09-23 MED ORDER — AMIODARONE HCL 200 MG PO TABS: 100.0000 mg | ORAL_TABLET | Freq: Every day | ORAL | 3 refills | Status: DC

## 2019-09-23 NOTE — Telephone Encounter (Signed)
Left message to call back  

## 2019-09-23 NOTE — Addendum Note (Signed)
Addended by: Drue Novel I on: 09/23/2019 02:58 PM   Modules accepted: Orders

## 2019-09-23 NOTE — Telephone Encounter (Signed)
Author: Imogene Burn, PA-C Service: Cardiology Author Type: Physician Assistant  Filed: 09/22/2019 7:57 AM Encounter Date: 09/21/2019 Status: Signed  Editor: Imogene Burn, PA-C (Physician Assistant)     Can you tell patient that Dr. Rayann Heman said if he thinks amiodarone has made his tremors worse to go back to amiodarone 100 mg daily like he was doing and monitor heart rate and tremors. Please reach out to Dr. Rayann Heman for further concerns on arrhythmias and amiodarone. thanks

## 2019-09-23 NOTE — Telephone Encounter (Signed)
Called and spoke to the patient and made him aware of recommendations below that he may decrease his amiodarone to 100 mg QD since he feels like the 200 mg dose makes his tremors worse.  Instructed patient to start his chlorthalidone for blood pressure.

## 2019-09-23 NOTE — Telephone Encounter (Signed)
Patient returning call.

## 2019-09-24 ENCOUNTER — Ambulatory Visit (HOSPITAL_COMMUNITY)
Admission: RE | Admit: 2019-09-24 | Discharge: 2019-09-24 | Disposition: A | Discharge status: Home / Self Care | Payer: PPO | Source: Ambulatory Visit | Attending: Cardiovascular Disease | Admitting: Cardiovascular Disease

## 2019-09-24 ENCOUNTER — Other Ambulatory Visit: Payer: Self-pay

## 2019-09-24 DIAGNOSIS — R0989 Other specified symptoms and signs involving the circulatory and respiratory systems: Secondary | ICD-10-CM | POA: Insufficient documentation

## 2019-10-08 DIAGNOSIS — I471 Supraventricular tachycardia: Secondary | ICD-10-CM | POA: Diagnosis not present

## 2019-10-08 DIAGNOSIS — I10 Essential (primary) hypertension: Secondary | ICD-10-CM | POA: Diagnosis not present

## 2019-10-08 DIAGNOSIS — I499 Cardiac arrhythmia, unspecified: Secondary | ICD-10-CM | POA: Diagnosis not present

## 2019-10-15 ENCOUNTER — Telehealth: Payer: Self-pay | Admitting: *Deleted

## 2019-10-15 NOTE — Telephone Encounter (Signed)
Dr Rayann Heman, Mr Pinkhasov was seen by Estella Husk on 01/12. Still having problems w/ elevated HR with exertion, but had mistakenly decreased amio to 100 mg qd. She asked him to go back up to 200 mg ad.   You saw him on 08/25/2019, WCT, bifasicular block at baseline, PVCs, ?SVT w/ aberrancy.  1.  Wide complex tachycardia Given bifascicular block at baseline, I suspect that his arrhythmia was SVT with aberrancy.  I cannot completely exclude focal VT.  He has a normal EF.   He is doing well with amiodarone currently.  Reduce amiodarone to 200mg  daily in 2-3 weeks. Given severe LA enlargement, bifascicular block, and PVCs I am not excited about ablation for him long term.   He needs a colonoscopy in Feb, is he at acceptable risk for this? Thank you.   Rosaria Ferries, PA-C 10/15/2019 2:35 PM

## 2019-10-15 NOTE — Telephone Encounter (Signed)
   [facility] Pre-operative Risk Assessment    Request for surgical clearance:  1. What type of surgery is being performed? COLONOSCOPY   2. When is this surgery scheduled? TBD (SOMETIME IN FEB)   3. What type of clearance is required (medical clearance vs. Pharmacy clearance to hold med vs. Both)? MEDICAL  4. Are there any medications that need to be held prior to surgery and how long? NONE LISTED    5. Practice name and name of physician performing surgery? [facility]; DR. Alessandra Bevels   6. What is your office phone number [phone]    7.   What is your office fax number [phone]  8.   Anesthesia type (None, local, MAC, general) ? PROPOFOL   Julaine Hua 10/15/2019, 10:25 AM  _________________________________________________________________   (provider comments below)

## 2019-10-20 DIAGNOSIS — G4733 Obstructive sleep apnea (adult) (pediatric): Secondary | ICD-10-CM | POA: Diagnosis not present

## 2019-10-20 DIAGNOSIS — Z79899 Other long term (current) drug therapy: Secondary | ICD-10-CM | POA: Diagnosis not present

## 2019-10-20 DIAGNOSIS — I471 Supraventricular tachycardia: Secondary | ICD-10-CM | POA: Diagnosis not present

## 2019-10-20 DIAGNOSIS — I10 Essential (primary) hypertension: Secondary | ICD-10-CM | POA: Diagnosis not present

## 2019-10-20 DIAGNOSIS — R0600 Dyspnea, unspecified: Secondary | ICD-10-CM | POA: Diagnosis not present

## 2019-10-24 DIAGNOSIS — G4733 Obstructive sleep apnea (adult) (pediatric): Secondary | ICD-10-CM | POA: Diagnosis not present

## 2019-10-25 DIAGNOSIS — J42 Unspecified chronic bronchitis: Secondary | ICD-10-CM | POA: Diagnosis not present

## 2019-10-25 DIAGNOSIS — I10 Essential (primary) hypertension: Secondary | ICD-10-CM | POA: Diagnosis not present

## 2019-10-26 NOTE — Telephone Encounter (Signed)
Follow Up:   Cheri from Dr [facility] calling to find out the status of clearance that was faxed on 10-14-19. She need this back asap please. Please fax to [phone]

## 2019-10-27 NOTE — Telephone Encounter (Signed)
Dr. Rayann Heman,  Please see Jordan Likes note below.  Do you recommend further assessment prior to colonoscopy? Thank you

## 2019-10-28 NOTE — Telephone Encounter (Signed)
   Primary Cardiologist: Larae Grooms, MD  Chart reviewed as part of pre-operative protocol coverage.   Per Dr. Rayann Heman, [place] to proceed with colonoscopy.   Therefore, based on ACC/AHA guidelines, the patient would be at acceptable risk for the planned procedure without further cardiovascular testing.   I will route this recommendation to the requesting party via Epic fax function and remove from pre-op pool.  Please call with questions.  [place], PA 10/28/2019, 4:22 PM

## 2019-10-28 NOTE — Telephone Encounter (Signed)
OK to proceed from my standpoint.

## 2019-11-01 NOTE — Progress Notes (Deleted)
Cardiology Office Note   Date:  11/01/2019   ID:  Jerry Ruiz, Jerry Ruiz Nov 30, 1950, MRN [id number]  PCP:  Lajean Manes, MD    No chief complaint on file.  HTN  Wt Readings from Last 3 Encounters:  09/21/19 234 lb (106.1 kg)  08/25/19 222 lb (100.7 kg)  08/19/19 225 lb (102.1 kg)       History of Present Illness: Jerry Ruiz is a 69 y.o. male  with a hx of Pericardial cyst noted on prior CT scan in 2013, HTN. Echocardiograms have not demonstrated pericardial cyst. Last echo 12/15.  He has had PVCs in the past.  BPs have been difficult to control. He was seen in the PharmD BP clinic back in 2017, but then went to PMD for a few years after that time. He had trouble with Hyzaar, losartan: "He notes significant side effects to Hyzaar. He was fatigued and started to notice diffuse arthralgias. His arthralgias have not really improved. However, his fatigue has improved since stopping Hyzaar.(in 2017)"  He has had Diltiazem started recently in 06/2019, and increased to 180 mg daily.  He has had some episodes of PVCs.   Had SVT in 08/2019, referred to EP.  HTN plan was :"High in the hospital.  I think he will need more meds but he prefers to see the effects of CAP.  Continue irbesartan.  If BP remains  High after starting CPAP for OSA, then would increase irbesartan to 300 mg daily."     Past Medical History:  Diagnosis Date  . Bifascicular block   . History of echocardiogram    a. Echo 12/15: EF 55-60%, no RWMA, mild LAE, trivial MR  . History of echocardiogram    Echo 12/17: mod conc LVH, EF 55-60, no RWMA, Gr 1 DD, mild AI, calcified AV leaflets, trivial MR, mod LAE, normal RVSF, mild TR, no pericardial eff  . HTN (hypertension)   . PVC's (premature ventricular contractions)     Past Surgical History:  Procedure Laterality Date  . HEMORROIDECTOMY    . KNEE SURGERY    . SHOULDER SURGERY       Current Outpatient Medications  Medication Sig Dispense  Refill  . amiodarone (PACERONE) 200 MG tablet Take 0.5 tablets (100 mg total) by mouth daily. 45 tablet 3  . chlorthalidone (HYGROTON) 25 MG tablet Take 0.5 tablets (12.5 mg total) by mouth daily. 45 tablet 3  . diltiazem (CARDIZEM SR) 120 MG 12 hr capsule Take 120 mg by mouth daily.    Marland Kitchen FISH OIL-COENZYME Q10 PO Take 1 capsule by mouth daily. PATIENT NOT SURE OF CURRENT DOSAGE    . GARLIC PO Take 0000000 mg by mouth daily.    . irbesartan (AVAPRO) 300 MG tablet Take 1 tablet (300 mg total) by mouth daily. 30 tablet 0  . metoprolol succinate (TOPROL-XL) 50 MG 24 hr tablet Take 50 mg by mouth daily. Take with or immediately following a meal.    . Multiple Vitamin (MULTIVITAMIN) capsule Take 1 capsule by mouth daily.    . Probiotic Product (PROBIOTIC DAILY PO) Take 1 tablet by mouth daily.     No current facility-administered medications for this visit.    Allergies:   Amlodipine and Sulfa antibiotics    Social History:  The patient  reports that he has quit smoking. He has never used smokeless tobacco. He reports current alcohol use.   Family History:  The patient's ***family history includes Arrhythmia in his mother;  Heart disease in his mother; Hypertension in his father and mother; Parkinson's disease in his father.    ROS:  Please see the history of present illness.   Otherwise, review of systems are positive for ***.   All other systems are reviewed and negative.    PHYSICAL EXAM: VS:  There were no vitals taken for this visit. , BMI There is no height or weight on file to calculate BMI. GEN: Well nourished, well developed, in no acute distress  HEENT: normal  Neck: no JVD, carotid bruits, or masses Cardiac: ***RRR; no murmurs, rubs, or gallops,no edema  Respiratory:  clear to auscultation bilaterally, normal work of breathing GI: soft, nontender, nondistended, + BS MS: no deformity or atrophy  Skin: warm and dry, no rash Neuro:  Strength and sensation are intact Psych: euthymic  mood, full affect   EKG:   The ekg ordered today demonstrates ***   Recent Labs: 08/18/2019: B Natriuretic Peptide 66.0; Magnesium 2.1; TSH 2.055 08/19/2019: BUN 19; Creatinine, Ser 1.05; Hemoglobin 15.5; Platelets 257; Potassium 3.8; Sodium 139   Lipid Panel    Component Value Date/Time   CHOL 153 05/24/2014 0927   TRIG 89 05/24/2014 0927   HDL 30 (L) 05/24/2014 0927   CHOLHDL 5.1 05/24/2014 0927   VLDL 18 05/24/2014 0927   LDLCALC 105 (H) 05/24/2014 0927     Other studies Reviewed: Additional studies/ records that were reviewed today with results demonstrating: ***.   ASSESSMENT AND PLAN:  1. HTN: 2. PVCs 3. ED: 4. Pericardial cyst: 5. Obesity: OSA.   Current medicines are reviewed at length with the patient today.  The patient concerns regarding his medicines were addressed.  The following changes have been made:  No change***  Labs/ tests ordered today include: *** No orders of the defined types were placed in this encounter.   Recommend 150 minutes/week of aerobic exercise Low fat, low carb, high fiber diet recommended  Disposition:   FU in ***   Signed, Larae Grooms, MD  11/01/2019 3:12 PM    [facility] [address], [place], [place]  [address] Phone: [phone]; Fax: [phone]

## 2019-11-03 ENCOUNTER — Ambulatory Visit: Payer: PPO | Admitting: Interventional Cardiology

## 2019-11-03 NOTE — Progress Notes (Signed)
Cardiology Office Note   Date:  11/05/2019   ID:  [name], [name] [birth year], MRN [id number]  PCP:  Lajean Manes, MD    No chief complaint on file.  HTN  Wt Readings from Last 3 Encounters:  11/05/19 229 lb 1.9 oz (103.9 kg)  09/21/19 234 lb (106.1 kg)  08/25/19 222 lb (100.7 kg)       History of Present Illness: Jerry Ruiz is a 69 y.o. male  with a hx of Pericardial cyst noted on prior CT scan in 2013, HTN. Echocardiograms have not demonstrated pericardial cyst. Last echo 12/15.  He has had PVCs in the past.  BPs have been difficult to control. He was seen in the PharmD BP clinic back in 2017, but then went to PMD for a few years after that time. He had trouble with Hyzaar, losartan: "He notes significant side effects to Hyzaar. He was fatigued and started to notice diffuse arthralgias. His arthralgias have not really improved. However, his fatigue has improved since stopping Hyzaar.(in 2017)"  He has had Diltiazem started recently in 06/2019, and increased to 180 mg daily.  He has had some episodes of PVCs.   Had SVT in 08/2019, referred to EP.  HTN plan was :"High in the hospital.  I think he will need more meds but he prefers to see the effects of CAP.  Continue irbesartan.  If BP remains  High after starting CPAP for OSA, then would increase irbesartan to 300 mg daily."  He changed Dilt to 180 daily and Irbesartan 150 mg daily.    He was started on Amio for his SVT.  Noted some tremors in the morning when he is using his left hand. No tremor at rest.  Only with fine movement.  Denies : Chest pain. Dizziness. Leg edema. Nitroglycerin use. Orthopnea. Palpitations. Paroxysmal nocturnal dyspnea. Shortness of breath. Syncope.   Back exercising in a gym. Stoppedtestosterone.  Started CPAP 3 months ago and BPs improved. Wants to take l-arginine.     Past Medical History:  Diagnosis Date  . Bifascicular block   . History of echocardiogram    a. Echo  12/15: EF 55-60%, no RWMA, mild LAE, trivial MR  . History of echocardiogram    Echo 12/17: mod conc LVH, EF 55-60, no RWMA, Gr 1 DD, mild AI, calcified AV leaflets, trivial MR, mod LAE, normal RVSF, mild TR, no pericardial eff  . HTN (hypertension)   . PVC's (premature ventricular contractions)     Past Surgical History:  Procedure Laterality Date  . HEMORROIDECTOMY    . KNEE SURGERY    . SHOULDER SURGERY       Current Outpatient Medications  Medication Sig Dispense Refill  . amiodarone (PACERONE) 200 MG tablet Take 0.5 tablets (100 mg total) by mouth daily. (Patient taking differently: Take 200 mg by mouth daily. ) 45 tablet 3  . chlorthalidone (HYGROTON) 25 MG tablet Take 0.5 tablets (12.5 mg total) by mouth daily. 45 tablet 3  . diltiazem (CARDIZEM SR) 120 MG 12 hr capsule Take 120 mg by mouth daily.    Marland Kitchen FISH OIL-COENZYME Q10 PO Take 1 capsule by mouth daily. PATIENT NOT SURE OF CURRENT DOSAGE    . GARLIC PO Take 0000000 mg by mouth daily.    . irbesartan (AVAPRO) 300 MG tablet Take 1 tablet (300 mg total) by mouth daily. 30 tablet 0  . metoprolol succinate (TOPROL-XL) 50 MG 24 hr tablet Take 50 mg by mouth  daily. Take with or immediately following a meal.    . Multiple Vitamin (MULTIVITAMIN) capsule Take 1 capsule by mouth daily.    . Probiotic Product (PROBIOTIC DAILY PO) Take 1 tablet by mouth daily.     No current facility-administered medications for this visit.    Allergies:   Amlodipine and Sulfa antibiotics    Social History:  The patient  reports that he has quit smoking. He has never used smokeless tobacco. He reports current alcohol use.   Family History:  The patient's family history includes Arrhythmia in his mother; Heart disease in his mother; Hypertension in his father and mother; Parkinson's disease in his father.    ROS:  Please see the history of present illness.   Otherwise, review of systems are positive for gets angry easily- stopped testosterone.   All  other systems are reviewed and negative.    PHYSICAL EXAM: VS:  BP 140/82   Pulse (!) 58   Ht 6' (1.829 m)   Wt 229 lb 1.9 oz (103.9 kg)   SpO2 97%   BMI 31.07 kg/m  , BMI Body mass index is 31.07 kg/m. GEN: Well nourished, well developed, in no acute distress  HEENT: normal  Neck: no JVD, carotid bruits, or masses Cardiac: RRR; no murmurs, rubs, or gallops,no edema  Respiratory:  clear to auscultation bilaterally, normal work of breathing GI: soft, nontender, nondistended, + BS MS: no deformity or atrophy  Skin: warm and dry, no rash Neuro:  Strength and sensation are intact Psych: euthymic mood, full affect   EKG:   The ekg ordered 08/23/2019 demonstrates SVT, RBBB pattern, wide QRS   Recent Labs: 08/18/2019: B Natriuretic Peptide 66.0; Magnesium 2.1; TSH 2.055 08/19/2019: BUN 19; Creatinine, Ser 1.05; Hemoglobin 15.5; Platelets 257; Potassium 3.8; Sodium 139   Lipid Panel    Component Value Date/Time   CHOL 153 05/24/2014 0927   TRIG 89 05/24/2014 0927   HDL 30 (L) 05/24/2014 0927   CHOLHDL 5.1 05/24/2014 0927   VLDL 18 05/24/2014 0927   LDLCALC 105 (H) 05/24/2014 0927     Other studies Reviewed: Additional studies/ records that were reviewed today with results demonstrating: hospital records.   ASSESSMENT AND PLAN:  1. HTN: BPs at home have been controlled. Better since starting CPAP.  He stopped testosterone supplement as well.  He had mood issues that prompted stopping the testosterone. 2. SVT: He has had a few  Episodes lasting a few seconds, and terminating by itself.  Will check with Dr. Rayann Heman about morning tremors with activity, to see if any other meds would be suggested.  Feels that it is difficult to get a deep breath with the resumption of exercise. 3. PVCs: Sx improved as well.  Continue Dilt.  4. ED: Wants to try L-arginine.  Will check with PharmD. He did not want to try sildenafil.  5. Pericardial cyst: No CP. Back to exercising at the gym.    Discussed "talk test."  6. Obesity: OSA.  Using CPAP.  Working on getting weight off.    Current medicines are reviewed at length with the patient today.  The patient concerns regarding his medicines were addressed.  The following changes have been made:  No change  Labs/ tests ordered today include:  No orders of the defined types were placed in this encounter.   Recommend 150 minutes/week of aerobic exercise Low fat, low carb, high fiber diet recommended  Disposition:   FU in 6 months   Signed, Conception Oms  Irish Lack, MD  11/05/2019 9:14 AM    [facility] [place], [place], [place]  [address] Phone: [phone]; Fax: [phone]

## 2019-11-05 ENCOUNTER — Other Ambulatory Visit: Payer: Self-pay

## 2019-11-05 ENCOUNTER — Ambulatory Visit: Payer: PPO | Admitting: Interventional Cardiology

## 2019-11-05 ENCOUNTER — Encounter: Payer: Self-pay | Admitting: Interventional Cardiology

## 2019-11-05 VITALS — BP 140/82 | HR 58 | Ht 72.0 in | Wt 229.1 lb

## 2019-11-05 DIAGNOSIS — G4733 Obstructive sleep apnea (adult) (pediatric): Secondary | ICD-10-CM

## 2019-11-05 DIAGNOSIS — N529 Male erectile dysfunction, unspecified: Secondary | ICD-10-CM

## 2019-11-05 DIAGNOSIS — I499 Cardiac arrhythmia, unspecified: Secondary | ICD-10-CM | POA: Diagnosis not present

## 2019-11-05 DIAGNOSIS — I10 Essential (primary) hypertension: Secondary | ICD-10-CM

## 2019-11-05 DIAGNOSIS — I471 Supraventricular tachycardia: Secondary | ICD-10-CM | POA: Diagnosis not present

## 2019-11-05 NOTE — Patient Instructions (Signed)
Medication Instructions:  Your physician recommends that you continue on your current medications as directed. Please refer to the Current Medication list given to you today.  *If you need a refill on your cardiac medications before your next appointment, please call your pharmacy*   Lab Work: None ordered  If you have labs (blood work) drawn today and your tests are completely normal, you will receive your results only by: . MyChart Message (if you have MyChart) OR . A paper copy in the mail If you have any lab test that is abnormal or we need to change your treatment, we will call you to review the results.   Testing/Procedures: None ordered   Follow-Up: At [facility], you and your health needs are our priority.  As part of our continuing mission to provide you with exceptional heart care, we have created designated Provider Care Teams.  These Care Teams include your primary Cardiologist (physician) and Advanced Practice Providers (APPs -  Physician Assistants and Nurse Practitioners) who all work together to provide you with the care you need, when you need it.  We recommend signing up for the patient portal called "MyChart".  Sign up information is provided on this After Visit Summary.  MyChart is used to connect with patients for Virtual Visits (Telemedicine).  Patients are able to view lab/test results, encounter notes, upcoming appointments, etc.  Non-urgent messages can be sent to your provider as well.   To learn more about what you can do with MyChart, go to [email or web address].    Your next appointment:   6 month(s)  The format for your next appointment:   In Person  Provider:   You may see Jayadeep Varanasi, MD or one of the following Advanced Practice Providers on your designated Care Team:    Dayna Dunn, PA-C  Michele Lenze, PA-C    Other Instructions   

## 2019-11-08 NOTE — Progress Notes (Signed)
Patient notified

## 2019-11-09 DIAGNOSIS — I10 Essential (primary) hypertension: Secondary | ICD-10-CM | POA: Diagnosis not present

## 2019-11-09 DIAGNOSIS — J42 Unspecified chronic bronchitis: Secondary | ICD-10-CM | POA: Diagnosis not present

## 2019-11-10 ENCOUNTER — Telehealth: Payer: Self-pay

## 2019-11-10 NOTE — Telephone Encounter (Signed)
-----   Message from Jettie Booze, MD sent at 11/09/2019 11:19 PM EST ----- Please see below message from Dr. Rayann Heman.  JV ----- Message ----- From: Thompson Grayer, MD Sent: 11/09/2019  10:18 PM EST To: Jettie Booze, MD, Leeroy Bock, [place]  I am ok with L-arginine  Why dont we reduce amiodarone to 100mg  daily  ----- Message ----- From: Jettie Booze, MD Sent: 11/05/2019   9:48 AM EST To: Thompson Grayer, MD, Leeroy Bock, [facility]  Jeneen Rinks, He Reports a mild tremor only in the mornings after starting Amio.  Not sure if you would change med.  You have virtual visit coming up with him.  We did ECG in office today. Jinny Blossom, he wants to start L-arginine.  OK with his other meds.?

## 2019-11-10 NOTE — Telephone Encounter (Signed)
Left message for patient to call back  

## 2019-11-12 NOTE — Telephone Encounter (Signed)
Patient instructed to decrease amiodarone to 100 mg QD. Patient verbalized understanding.

## 2019-11-17 ENCOUNTER — Encounter: Payer: Self-pay | Admitting: Internal Medicine

## 2019-11-17 ENCOUNTER — Telehealth (INDEPENDENT_AMBULATORY_CARE_PROVIDER_SITE_OTHER): Payer: PPO | Admitting: Internal Medicine

## 2019-11-17 VITALS — Ht 72.0 in | Wt 222.0 lb

## 2019-11-17 DIAGNOSIS — I493 Ventricular premature depolarization: Secondary | ICD-10-CM

## 2019-11-17 DIAGNOSIS — N529 Male erectile dysfunction, unspecified: Secondary | ICD-10-CM

## 2019-11-17 DIAGNOSIS — I471 Supraventricular tachycardia, unspecified: Secondary | ICD-10-CM

## 2019-11-17 DIAGNOSIS — G4733 Obstructive sleep apnea (adult) (pediatric): Secondary | ICD-10-CM | POA: Diagnosis not present

## 2019-11-17 DIAGNOSIS — I10 Essential (primary) hypertension: Secondary | ICD-10-CM | POA: Diagnosis not present

## 2019-11-17 DIAGNOSIS — G250 Essential tremor: Secondary | ICD-10-CM | POA: Diagnosis not present

## 2019-11-17 NOTE — Progress Notes (Signed)
Electrophysiology TeleHealth Note   Due to national recommendations of social distancing due to COVID 19, an audio/video telehealth visit is felt to be most appropriate for this patient at this time.  See MyChart message from today for the patient's consent to telehealth for [facility].  Date:  11/17/2019   ID:  Jerry Ruiz, DOB 04-30-1951, MRN [id number]  Location: patient's home  Provider location:  [facility]  Evaluation Performed: Follow-up visit  PCP:  Lajean Manes, MD   Electrophysiologist:  Dr Rayann Heman  Chief Complaint:  palpitations  History of Present Illness:    Jerry Ruiz is a 69 y.o. male who presents via telehealth conferencing today.  Since last being seen in our clinic, the patient reports doing reasonably well.  He has had tremors with amiodarone.  We have reduced his dose to 100mg  daily this past Friday.  He reports that it is too early to tell if this has improved his symptoms.  He has occasional tachypalpitations due to presyncope, lasting less than an hour.  He feels that his PVCs have improved.  He feels that his exercise tolerance has reduced on medicines.  He feels that he is having trouble getting his heart rate up with activity.  Today, he denies symptoms of chest pain, shortness of breath,  lower extremity edema, dizziness, presyncope, or syncope.  The patient is otherwise without complaint today.  The patient denies symptoms of fevers, chills, cough, or new SOB worrisome for COVID 19.  Past Medical History:  Diagnosis Date  . Bifascicular block   . History of echocardiogram    a. Echo 12/15: EF 55-60%, no RWMA, mild LAE, trivial MR  . History of echocardiogram    Echo 12/17: mod conc LVH, EF 55-60, no RWMA, Gr 1 DD, mild AI, calcified AV leaflets, trivial MR, mod LAE, normal RVSF, mild TR, no pericardial eff  . HTN (hypertension)   . PVC's (premature ventricular contractions)     Past Surgical History:  Procedure Laterality Date  .  HEMORROIDECTOMY    . KNEE SURGERY    . SHOULDER SURGERY      Current Outpatient Medications  Medication Sig Dispense Refill  . amiodarone (PACERONE) 200 MG tablet Take 100 mg by mouth daily.    . chlorthalidone (HYGROTON) 25 MG tablet Take 0.5 tablets (12.5 mg total) by mouth daily. 45 tablet 3  . diltiazem (DILACOR XR) 180 MG 24 hr capsule Take 180 mg by mouth daily.    Marland Kitchen FISH OIL-COENZYME Q10 PO Take 1 capsule by mouth daily. PATIENT NOT SURE OF CURRENT DOSAGE    . GARLIC PO Take 0000000 mg by mouth daily.    . irbesartan (AVAPRO) 150 MG tablet Take 150 mg by mouth daily.    . metoprolol succinate (TOPROL-XL) 50 MG 24 hr tablet Take 50 mg by mouth daily. Take with or immediately following a meal.    . Multiple Vitamin (MULTIVITAMIN) capsule Take 1 capsule by mouth daily.    . Probiotic Product (PROBIOTIC DAILY PO) Take 1 tablet by mouth daily.     No current facility-administered medications for this visit.    Allergies:   Amlodipine and Sulfa antibiotics   Social History:  The patient  reports that he has quit smoking. He has never used smokeless tobacco. He reports current alcohol use.   ROS:  Please see the history of present illness.   All other systems are personally reviewed and negative.    Exam:  Vital Signs:  Ht 6' (1.829 m)   Wt 222 lb (100.7 kg)   BMI 30.11 kg/m   Well sounding and appearing, alert and conversant, regular work of breathing,  good skin color Eyes- anicteric, neuro- grossly intact, skin- no apparent rash or lesions or cyanosis, mouth- oral mucosa is pink  Labs/Other Tests and Data Reviewed:    Recent Labs: 08/18/2019: B Natriuretic Peptide 66.0; Magnesium 2.1; TSH 2.055 08/19/2019: BUN 19; Creatinine, Ser 1.05; Hemoglobin 15.5; Platelets 257; Potassium 3.8; Sodium 139   Wt Readings from Last 3 Encounters:  11/17/19 222 lb (100.7 kg)  11/05/19 229 lb 1.9 oz (103.9 kg)  09/21/19 234 lb (106.1 kg)     Echo 08/02/19- EF 55%, moderate LVH, severe LA  enlargement  ekg 11/05/19- sinus rhythm 52 bpm, PR 172 msec, RBBB (QRS 152 msec), QTc 462 msec  ASSESSMENT & PLAN:    1.  PVCs Infrequent and unlikely to respond to ablation He has been previously placed on amiodarone I have reduced his amiodarone to 100mg  daily to avoid toxicity with this medicine Ultimately we may have to stop his amiodarone due to tremors and fatigue  2. HTN Stable No change required today  3. Obesity Lifestyle modification is encouraged  4. OSA Compliance with CPAP is encouraged  5. SVT He continues to have palpitations. Therapeutic strategies for supraventricular tachycardia including medicine (continued amiodarone, sotalol) and ablation were discussed in detail with the patient today. Risk, benefits, and alternatives to EP study and radiofrequency ablation were also discussed in detail today. At this time, he would like to avoid ablation. He has intermittent palpitations of unclear etiology.  We discussed ILR implantation for long term monitoring and further evaluation of his palpitations. He likes a holistic approach and would prefer to avoid medicines long term.  He will think about options of ILR vs ablation and contact my office once he is ready to proceed.  6. Severe LA enlargement No previously documented afib  7. Erectile dysfunction He attributes to medicines for SVT  Follow-up:  2 months with me   Patient Risk:  after full review of this patients clinical status, I feel that they are at moderate risk at this time.  Today, I have spent 15 minutes with the patient with telehealth technology discussing arrhythmia management .    Army Fossa, MD  11/17/2019 9:55 AM     [address] [place] [address] [phone] (office) [phone] (fax)

## 2019-11-25 ENCOUNTER — Telehealth: Payer: Self-pay | Admitting: Internal Medicine

## 2019-11-25 NOTE — Telephone Encounter (Signed)
Patient states he would like to go ahead with getting his loop recorder.

## 2019-11-29 ENCOUNTER — Ambulatory Visit: Payer: PPO | Admitting: Internal Medicine

## 2019-11-29 ENCOUNTER — Other Ambulatory Visit: Payer: Self-pay

## 2019-11-29 ENCOUNTER — Encounter: Payer: Self-pay | Admitting: Internal Medicine

## 2019-11-29 VITALS — BP 184/90 | HR 60 | Ht 72.0 in | Wt 225.0 lb

## 2019-11-29 DIAGNOSIS — I10 Essential (primary) hypertension: Secondary | ICD-10-CM | POA: Diagnosis not present

## 2019-11-29 DIAGNOSIS — G4733 Obstructive sleep apnea (adult) (pediatric): Secondary | ICD-10-CM | POA: Diagnosis not present

## 2019-11-29 DIAGNOSIS — I471 Supraventricular tachycardia: Secondary | ICD-10-CM | POA: Diagnosis not present

## 2019-11-29 DIAGNOSIS — R002 Palpitations: Secondary | ICD-10-CM

## 2019-11-29 DIAGNOSIS — I493 Ventricular premature depolarization: Secondary | ICD-10-CM | POA: Diagnosis not present

## 2019-11-29 HISTORY — PX: OTHER SURGICAL HISTORY: SHX169

## 2019-11-29 NOTE — Patient Instructions (Addendum)
Medication Instructions:  Your physician recommends that you continue on your current medications as directed. Please refer to the Current Medication list given to you today.  *If you need a refill on your cardiac medications before your next appointment, please call your pharmacy*   Lab Work: None ordered If you have labs (blood work) drawn today and your tests are completely normal, you will receive your results only by: Marland Kitchen MyChart Message (if you have MyChart) OR . A paper copy in the mail If you have any lab test that is abnormal or we need to change your treatment, we will call you to review the results.   Testing/Procedures: None ordered   Follow-Up: At [facility], you and your health needs are our priority.  As part of our continuing mission to provide you with exceptional heart care, we have created designated Provider Care Teams.  These Care Teams include your primary Cardiologist (physician) and Advanced Practice Providers (APPs -  Physician Assistants and Nurse Practitioners) who all work together to provide you with the care you need, when you need it.  Your next appointment:   3 month(s)  The format for your next appointment:   In Person/Virtual  Provider:   Thompson Grayer, MD   Thank you for choosing [facility]!!     Other Instructions

## 2019-11-29 NOTE — Progress Notes (Signed)
PCP: Lajean Manes, MD Primary Cardiologist: Dr Irish Lack Primary EP: Dr Rayann Heman CC: palpitations Jerry Ruiz is a 69 y.o. male who presents today for routine electrophysiology followup.  Since last being seen in our clinic, the patient reports doing very well.  He continues to have palpitations of unclear etiology.  Today, he denies symptoms of chest pain, shortness of breath,  lower extremity edema, dizziness, presyncope, or syncope.  The patient is otherwise without complaint today.   Past Medical History:  Diagnosis Date  . Bifascicular block   . History of echocardiogram    a. Echo 12/15: EF 55-60%, no RWMA, mild LAE, trivial MR  . History of echocardiogram    Echo 12/17: mod conc LVH, EF 55-60, no RWMA, Gr 1 DD, mild AI, calcified AV leaflets, trivial MR, mod LAE, normal RVSF, mild TR, no pericardial eff  . HTN (hypertension)   . PVC's (premature ventricular contractions)    Past Surgical History:  Procedure Laterality Date  . HEMORROIDECTOMY    . KNEE SURGERY    . SHOULDER SURGERY      ROS- all systems are reviewed and negatives except as per HPI above  Current Outpatient Medications  Medication Sig Dispense Refill  . amiodarone (PACERONE) 200 MG tablet Take 100 mg by mouth daily.    . chlorthalidone (HYGROTON) 25 MG tablet Take 12.5 mg by mouth every 3 (three) days.    Marland Kitchen diltiazem (DILACOR XR) 180 MG 24 hr capsule Take 180 mg by mouth daily.    Marland Kitchen FISH OIL-COENZYME Q10 PO Take 1 capsule by mouth daily. PATIENT NOT SURE OF CURRENT DOSAGE    . GARLIC PO Take 0000000 mg by mouth daily.    . irbesartan (AVAPRO) 150 MG tablet Take 150 mg by mouth daily.    . metoprolol succinate (TOPROL-XL) 50 MG 24 hr tablet Take 50 mg by mouth daily. Take with or immediately following a meal.    . Multiple Vitamin (MULTIVITAMIN) capsule Take 1 capsule by mouth daily.    . Probiotic Product (PROBIOTIC DAILY PO) Take 1 tablet by mouth daily.     No current facility-administered medications for  this visit.    Physical Exam: Vitals:   11/29/19 1008  BP: (!) 184/90  Pulse: 60  SpO2: 97%  Weight: 225 lb (102.1 kg)  Height: 6' (1.829 m)    GEN- The patient is well appearing, alert and oriented x 3 today.   Head- normocephalic, atraumatic Eyes-  Sclera clear, conjunctiva pink Ears- hearing intact Oropharynx- clear Lungs-   normal work of breathing Heart- Regular rate and rhythm  GI- soft, NT, ND, + BS Extremities- no clubbing, cyanosis, or edema  Wt Readings from Last 3 Encounters:  11/29/19 225 lb (102.1 kg)  11/17/19 222 lb (100.7 kg)  11/05/19 229 lb 1.9 oz (103.9 kg)   Assessment and Plan:  1. Palpitations.   Unclear etiology He has h/o PVCs which are infrequent.  He has had prior SVT.  In addition he has severe LA enlargement and OSA and thus likely has afib.  Given the infrequent nature of his palpitations, additional short term monitoring is therefore not likely to be beneficial. I would therefore advise implantation of an implantable loop recorder for long term arrhythmia monitoring.  Risks and benefits to ILR were discussed at length with the patient today, including but not limited to risks of bleeding and infection.  Extensive device education was performed.  Remote monitoring was also discussed at length today.  The patient understands and wishes to proceed.  We will proceed at this time with ILR implantation.  We are hoping to wean him off of his amiodarone depending on findings of his ILR.   2. OSA Compliance with CPAP is encouraged  3. HTN Stable No change required today  4. SVT As above  5. PVCs As above  6. Severe LA enlargement As above  Return to follow-up with me in 3 months  Thompson Grayer MD, [facility] 11/29/2019 10:48 AM      DESCRIPTION OF PROCEDURE:  Informed written consent was obtained.  The patient required no sedation for the procedure today.  The patients left chest was prepped and draped. Mapping over the patient's chest was  performed to identify the appropriate ILR site.  This area was found to be the left parasternal region over the 3rd-4th intercostal space.  The skin overlying this region was infiltrated with lidocaine for local analgesia.  A 0.5-cm incision was made at the implant site.  A subcutaneous ILR pocket was fashioned using a combination of sharp and blunt dissection.  A Medtronic Reveal Linq model LNQ 22 (SN [id number]) implantable loop recorder was then placed into the pocket R waves were very prominent and measured > 0.2 mV. EBL<1 ml.  Steri- Strips and a sterile dressing were then applied.  There were no early apparent complications.     CONCLUSIONS:   1. Successful implantation of a Medtronic Reveal LINQ implantable loop recorder for evaluation of palpitations  2. No early apparent complications.   Thompson Grayer MD, [facility] 11/29/2019 10:48 AM

## 2019-12-08 ENCOUNTER — Other Ambulatory Visit: Payer: Self-pay

## 2019-12-08 ENCOUNTER — Ambulatory Visit: Payer: PPO | Admitting: Student

## 2019-12-08 DIAGNOSIS — I10 Essential (primary) hypertension: Secondary | ICD-10-CM | POA: Diagnosis not present

## 2019-12-08 DIAGNOSIS — R002 Palpitations: Secondary | ICD-10-CM

## 2019-12-08 DIAGNOSIS — I499 Cardiac arrhythmia, unspecified: Secondary | ICD-10-CM | POA: Diagnosis not present

## 2019-12-08 LAB — CUP PACEART INCLINIC DEVICE CHECK
Date Time Interrogation Session: 20210331101222
Implantable Pulse Generator Implant Date: 20210322

## 2019-12-08 NOTE — Patient Instructions (Signed)
Medication Instructions:  none *If you need a refill on your cardiac medications before your next appointment, please call your pharmacy*   Lab Work: none If you have labs (blood work) drawn today and your tests are completely normal, you will receive your results only by: Marland Kitchen MyChart Message (if you have MyChart) OR . A paper copy in the mail If you have any lab test that is abnormal or we need to change your treatment, we will call you to review the results.   Testing/Procedures: none   Follow-Up: At [facility], you and your health needs are our priority.  As part of our continuing mission to provide you with exceptional heart care, we have created designated Provider Care Teams.  These Care Teams include your primary Cardiologist (physician) and Advanced Practice Providers (APPs -  Physician Assistants and Nurse Practitioners) who all work together to provide you with the care you need, when you need it.

## 2019-12-08 NOTE — Progress Notes (Signed)
ILR wound check in clinic. Steri strips previously removed. Wound well healed. Home monitor transmitting nightly. No episodes. Questions answered. R waves 0.20 mV

## 2019-12-17 DIAGNOSIS — G4733 Obstructive sleep apnea (adult) (pediatric): Secondary | ICD-10-CM | POA: Diagnosis not present

## 2019-12-21 DIAGNOSIS — G4733 Obstructive sleep apnea (adult) (pediatric): Secondary | ICD-10-CM | POA: Diagnosis not present

## 2019-12-22 DIAGNOSIS — G4733 Obstructive sleep apnea (adult) (pediatric): Secondary | ICD-10-CM | POA: Diagnosis not present

## 2019-12-24 DIAGNOSIS — I471 Supraventricular tachycardia: Secondary | ICD-10-CM | POA: Diagnosis not present

## 2019-12-24 DIAGNOSIS — I10 Essential (primary) hypertension: Secondary | ICD-10-CM | POA: Diagnosis not present

## 2019-12-31 ENCOUNTER — Telehealth: Payer: Self-pay | Admitting: Student

## 2019-12-31 ENCOUNTER — Ambulatory Visit (INDEPENDENT_AMBULATORY_CARE_PROVIDER_SITE_OTHER): Payer: PPO | Admitting: *Deleted

## 2019-12-31 DIAGNOSIS — I493 Ventricular premature depolarization: Secondary | ICD-10-CM

## 2019-12-31 LAB — CUP PACEART REMOTE DEVICE CHECK
Date Time Interrogation Session: 20210422185035
Implantable Pulse Generator Implant Date: 20210322

## 2019-12-31 NOTE — Progress Notes (Signed)
ILR Remote 

## 2019-12-31 NOTE — Telephone Encounter (Signed)
Scheduled remote with 5 symptom episodes. ILR placed for palpitations. 1 appears PVC, others appears short runs of WCT. Longest is 10 beats and irregular, shortest 3 beats.  Pt states symptoms that occur are "fluttering" in his chest, similar to his SVT previously. He denies lightheadedness or dizziness at these times.   Some mild lightheadedness in ams after taking morning meds.  Otherwise, doing well overall. Gets tired by mid day, 2-3 pm.   BP running 160s at home at times. Followed by PCP and gen cards as well. Has tele health monitoring for BP.   Baseline bradycardia mostly in 50s with RBBB.  Amiodarone 100 mg daily Diltiazem 180 mg daily Toprol 50 mg daily  Will forward to Dr. Rayann Heman to see if any adjustments recommended prior to next follow up.   Episode #5   Episode #4   Episode #3   Episode #2   Episode #1 appears lone PVC and it took him a moment to get to his activator.

## 2020-01-07 DIAGNOSIS — I10 Essential (primary) hypertension: Secondary | ICD-10-CM | POA: Diagnosis not present

## 2020-01-07 DIAGNOSIS — I499 Cardiac arrhythmia, unspecified: Secondary | ICD-10-CM | POA: Diagnosis not present

## 2020-01-13 ENCOUNTER — Telehealth: Payer: Self-pay | Admitting: Interventional Cardiology

## 2020-01-13 DIAGNOSIS — I10 Essential (primary) hypertension: Secondary | ICD-10-CM

## 2020-01-13 NOTE — Telephone Encounter (Signed)
Called and let patient know that I would send the his questions to Dr. Irish Lack for review and recommendation.

## 2020-01-13 NOTE — Telephone Encounter (Signed)
   Pt said he has 2 question for Dr. Irish Lack  1. His pcp recommend to put off his hernia procedure until his BP is down. He said his hernia making him more uncomfortable and planning to have a trip soon and would like to get Dr. Hassell Done recommendation if he is readyto have his hernia fix  2. Pt would like to know Dr. Hassell Done thoughts about using "The RESPeRATE" he said it's a machine to help to distress to lower BP. He wanted to know if he can use it and if Dr. Irish Lack will recommend it

## 2020-01-14 NOTE — Telephone Encounter (Signed)
Hernia pain may be worsening BP.  How high is BP and has he tried irbesartan 300 mg daily?  Megan, Can you see about getting him in to HTN clinic to makes some recs so he can get his BP down and hernia surgery done.  I have not heart of RESPeRATE machine.  Any recs?  Thanks.  JV

## 2020-01-14 NOTE — Telephone Encounter (Signed)
Thanks Melissa 

## 2020-01-14 NOTE — Telephone Encounter (Signed)
I had never heard of The RESPeRATE. I did do a quick literature search. Looks like there is pretty decent evidence for its ability to lower blood pressure some. If patient has the money to buy it, I think its not a bad idea to try. But he has to understand that these patients were also on medications, therefore he will still need to be on medications for blood pressure, maybe just not as many.  I would like to know why he only take chlorthalidone every three days.   Would recommend either increasing chlorthalidone to daily or increase irbesartan to 300mg  daily  I called the patient to find out what his blood pressure has been and to discuss the above. Did not answer. Left message to call back.  We have no opens next week. I could probably squeeze him in on Wed if needed. If he is willing to increase medication, then I could follow up with him in the office on the 17th.

## 2020-01-17 ENCOUNTER — Telehealth: Payer: PPO | Admitting: Internal Medicine

## 2020-01-17 MED ORDER — IRBESARTAN 300 MG PO TABS: 300.0000 mg | ORAL_TABLET | Freq: Every day | ORAL | 3 refills | Status: DC

## 2020-01-17 NOTE — Telephone Encounter (Signed)
Called and spoke with patient. He states that he is trying to get off of some medications because he doesn't like the side effects. When asked if there was a med in particular he thought was causing a problem he said the amiodarone. He states that his thinks his heart rate is too low. Says resting HR-low 50's- 40's sometimes when he's sitting around. Also states he has an issue with cold hands and cold feet, which he never had before. I have asked him to decrease metoprolol to 25mg  daily.  Patient states his blood pressure has been 150's/80. A few readings of 123456, 123456 systolic. Admits he forgets the diltiazem once or twice a week because he takes at night but all others in the AM. Dr. Felipa Eth told him to take at night.   He is also apparently followed by a PharmD at Dr. Sherryll Burger office, [facility] Com[facility]. He has a BP meter that sends his readings to them. I asked how often they call him and make adjustments. He states they usually just call when he hasn't checked BP in a few days or he has had a few high readings a few days in a row. He does have a phone visit with Liane Comber on June 2.  I have asked patient to increase his irbesartan to 300mg  daily as blood pressure is too high. He wants to have his hernia surgery, therefore need to get his BP down. To avoid stepping on toes, I will not schedule him in our HTN clinic. But I will have him get a BMP on 5/21 since we are increasing irbesartan. I will call patient with those results and make sure his HR has increased and BP has come down some. We did discuss the RESPeRATE. I advised that there is some data that it works. More by decreasing stress, promoting relaxation. The trials are small which is a limitation, but were found to have a statistical significant lowering. I advised that this would not eliminate the need for medication, but could mayeb limit how many he needed. I couldn't really recommend it, but if $300 wouldn't break the bank, I wouldn't tell him not to try  it. Patient states that he exercises daily. I will send message to Dr. Felipa Eth and L[facility] Comber to let them know what we discussed.

## 2020-01-21 DIAGNOSIS — G4733 Obstructive sleep apnea (adult) (pediatric): Secondary | ICD-10-CM | POA: Diagnosis not present

## 2020-01-23 NOTE — Telephone Encounter (Signed)
Keep scheduled follow up

## 2020-01-24 DIAGNOSIS — G4733 Obstructive sleep apnea (adult) (pediatric): Secondary | ICD-10-CM | POA: Diagnosis not present

## 2020-01-26 ENCOUNTER — Telehealth: Payer: Self-pay

## 2020-01-26 NOTE — Telephone Encounter (Signed)
Patient called in and wanted to let us know that he will be out of town 02/01/2020 for a week and wanted to let us know he can send a transmission when he gets back since his next one is 02/02/2020

## 2020-01-28 ENCOUNTER — Other Ambulatory Visit: Payer: Self-pay

## 2020-01-28 ENCOUNTER — Other Ambulatory Visit: Payer: PPO | Admitting: *Deleted

## 2020-01-28 DIAGNOSIS — I10 Essential (primary) hypertension: Secondary | ICD-10-CM | POA: Diagnosis not present

## 2020-01-28 LAB — BASIC METABOLIC PANEL
BUN/Creatinine Ratio: 14 (ref 10–24)
BUN: 16 mg/dL (ref 8–27)
CO2: 25 mmol/L (ref 20–29)
Calcium: 9.5 mg/dL (ref 8.6–10.2)
Chloride: 101 mmol/L (ref 96–106)
Creatinine, Ser: 1.13 mg/dL (ref 0.76–1.27)
GFR calc Af Amer: 77 mL/min/{1.73_m2} (ref >59)
GFR calc non Af Amer: 67 mL/min/{1.73_m2} (ref >59)
Glucose: 98 mg/dL (ref 65–99)
Potassium: 4.3 mmol/L (ref 3.5–5.2)
Sodium: 139 mmol/L (ref 134–144)

## 2020-01-31 LAB — CUP PACEART REMOTE DEVICE CHECK
Date Time Interrogation Session: 20210523185047
Implantable Pulse Generator Implant Date: 20210322

## 2020-02-02 ENCOUNTER — Ambulatory Visit (INDEPENDENT_AMBULATORY_CARE_PROVIDER_SITE_OTHER): Payer: PPO | Admitting: *Deleted

## 2020-02-02 DIAGNOSIS — R002 Palpitations: Secondary | ICD-10-CM | POA: Diagnosis not present

## 2020-02-03 NOTE — Progress Notes (Signed)
Carelink Summary Report / Loop Recorder 

## 2020-02-04 DIAGNOSIS — I499 Cardiac arrhythmia, unspecified: Secondary | ICD-10-CM | POA: Diagnosis not present

## 2020-02-04 DIAGNOSIS — I10 Essential (primary) hypertension: Secondary | ICD-10-CM | POA: Diagnosis not present

## 2020-02-09 DIAGNOSIS — J42 Unspecified chronic bronchitis: Secondary | ICD-10-CM | POA: Diagnosis not present

## 2020-02-09 DIAGNOSIS — I10 Essential (primary) hypertension: Secondary | ICD-10-CM | POA: Diagnosis not present

## 2020-02-21 DIAGNOSIS — G4733 Obstructive sleep apnea (adult) (pediatric): Secondary | ICD-10-CM | POA: Diagnosis not present

## 2020-02-24 DIAGNOSIS — G4733 Obstructive sleep apnea (adult) (pediatric): Secondary | ICD-10-CM | POA: Diagnosis not present

## 2020-03-06 ENCOUNTER — Ambulatory Visit (INDEPENDENT_AMBULATORY_CARE_PROVIDER_SITE_OTHER): Payer: PPO | Admitting: *Deleted

## 2020-03-06 DIAGNOSIS — R002 Palpitations: Secondary | ICD-10-CM

## 2020-03-06 LAB — CUP PACEART REMOTE DEVICE CHECK
Date Time Interrogation Session: 20210627230657
Implantable Pulse Generator Implant Date: 20210322

## 2020-03-07 NOTE — Progress Notes (Signed)
Carelink Summary Report / Loop Recorder 

## 2020-03-08 ENCOUNTER — Other Ambulatory Visit: Payer: Self-pay

## 2020-03-08 ENCOUNTER — Encounter: Payer: Self-pay | Admitting: Internal Medicine

## 2020-03-08 ENCOUNTER — Ambulatory Visit: Payer: Self-pay | Admitting: General Surgery

## 2020-03-08 ENCOUNTER — Ambulatory Visit: Payer: PPO | Admitting: Internal Medicine

## 2020-03-08 VITALS — BP 150/80 | HR 54 | Ht 71.0 in | Wt 218.6 lb

## 2020-03-08 DIAGNOSIS — G4733 Obstructive sleep apnea (adult) (pediatric): Secondary | ICD-10-CM

## 2020-03-08 DIAGNOSIS — I471 Supraventricular tachycardia: Secondary | ICD-10-CM | POA: Diagnosis not present

## 2020-03-08 DIAGNOSIS — I10 Essential (primary) hypertension: Secondary | ICD-10-CM | POA: Diagnosis not present

## 2020-03-08 DIAGNOSIS — I499 Cardiac arrhythmia, unspecified: Secondary | ICD-10-CM | POA: Diagnosis not present

## 2020-03-08 DIAGNOSIS — R002 Palpitations: Secondary | ICD-10-CM | POA: Diagnosis not present

## 2020-03-08 DIAGNOSIS — I493 Ventricular premature depolarization: Secondary | ICD-10-CM | POA: Diagnosis not present

## 2020-03-08 DIAGNOSIS — K4020 Bilateral inguinal hernia, without obstruction or gangrene, not specified as recurrent: Secondary | ICD-10-CM | POA: Diagnosis not present

## 2020-03-08 NOTE — Patient Instructions (Addendum)
Medication Instructions:  Your physician recommends that you continue on your current medications as directed. Please refer to the Current Medication list given to you today.  *If you need a refill on your cardiac medications before your next appointment, please call your pharmacy*  Lab Work: None ordered.  If you have labs (blood work) drawn today and your tests are completely normal, you will receive your results only by: Marland Kitchen MyChart Message (if you have MyChart) OR . A paper copy in the mail If you have any lab test that is abnormal or we need to change your treatment, we will call you to review the results.  Testing/Procedures: None ordered.  Follow-Up: At [facility], you and your health needs are our priority.  As part of our continuing mission to provide you with exceptional heart care, we have created designated Provider Care Teams.  These Care Teams include your primary Cardiologist (physician) and Advanced Practice Providers (APPs -  Physician Assistants and Nurse Practitioners) who all work together to provide you with the care you need, when you need it.  We recommend signing up for the patient portal called "MyChart".  Sign up information is provided on this After Visit Summary.  MyChart is used to connect with patients for Virtual Visits (Telemedicine).  Patients are able to view lab/test results, encounter notes, upcoming appointments, etc.  Non-urgent messages can be sent to your provider as well.   To learn more about what you can do with MyChart, go to [email or web address].    Your next appointment:   Your physician wants you to follow-up in: 6 months with Dr. Rayann Heman. You will receive a reminder letter in the mail two months in advance. If you don't receive a letter, please call our office to schedule the follow-up appointment.  Remote monthly monitoring.  Other Instructions:

## 2020-03-08 NOTE — Progress Notes (Signed)
PCP: Lajean Manes, MD Primary Cardiologist: Dr Irish Lack Primary EP: Dr Rayann Heman  Jerry Ruiz is a 69 y.o. male who presents today for routine electrophysiology followup.  Since last being seen in our clinic, the patient reports doing very well.   He has occasional palpitations which appear to be stress related.  No sustained arrhythmias on ILR. Today, he denies symptoms of  chest pain, shortness of breath,  lower extremity edema, dizziness, presyncope, or syncope.  The patient is otherwise without complaint today.   Past Medical History:  Diagnosis Date  . Bifascicular block   . History of echocardiogram    a. Echo 12/15: EF 55-60%, no RWMA, mild LAE, trivial MR  . History of echocardiogram    Echo 12/17: mod conc LVH, EF 55-60, no RWMA, Gr 1 DD, mild AI, calcified AV leaflets, trivial MR, mod LAE, normal RVSF, mild TR, no pericardial eff  . HTN (hypertension)   . PVC's (premature ventricular contractions)    Past Surgical History:  Procedure Laterality Date  . HEMORROIDECTOMY    . implantable loop recorder implantation  11/29/2019   Medtronic Reveal Linq model LNQ 22 (SN [id number]) implanted by Dr Rayann Heman for further evaluation of palpitations and arrhythmia management  . KNEE SURGERY    . SHOULDER SURGERY      ROS- all systems are reviewed and negatives except as per HPI above  Current Outpatient Medications  Medication Sig Dispense Refill  . amiodarone (PACERONE) 200 MG tablet Take 100 mg by mouth daily.    Marland Kitchen diltiazem (DILACOR XR) 180 MG 24 hr capsule Take 180 mg by mouth daily.    Marland Kitchen FISH OIL-COENZYME Q10 PO Take 1 capsule by mouth daily. PATIENT NOT SURE OF CURRENT DOSAGE    . GARLIC PO Take 295 mg by mouth daily.    . irbesartan (AVAPRO) 300 MG tablet Take 1 tablet (300 mg total) by mouth daily. 90 tablet 3  . metoprolol succinate (TOPROL-XL) 50 MG 24 hr tablet Take 25 mg by mouth daily. Take with or immediately following a meal.     . Multiple Vitamin (MULTIVITAMIN)  capsule Take 1 capsule by mouth daily.    . Probiotic Product (PROBIOTIC DAILY PO) Take 1 tablet by mouth daily.     No current facility-administered medications for this visit.    Physical Exam: Vitals:   03/08/20 1034  BP: (!) 150/80  Pulse: (!) 54  SpO2: 98%  Weight: 218 lb 9.6 oz (99.2 kg)  Height: 5\' 11"  (1.803 m)    GEN- The patient is well appearing, alert and oriented x 3 today.   Head- normocephalic, atraumatic Eyes-  Sclera clear, conjunctiva pink Ears- hearing intact Oropharynx- clear Lungs-  normal work of breathing Heart- Regular rate and rhythm  GI- soft  Extremities- no clubbing, cyanosis, or edema  Wt Readings from Last 3 Encounters:  03/08/20 218 lb 9.6 oz (99.2 kg)  11/29/19 225 lb (102.1 kg)  11/17/19 222 lb (100.7 kg)    EKG tracing ordered today is personally reviewed and shows sinus 54 bpm, PR 164 msec, RBBB (QRS 142 msec)  Assessment and Plan:  1. Palpitations primarily due to NSVT and PVCs by ILR No sustained arrhythmias Nonsustained Atach also noted Continue ILR monitoring Consider stopping amiodarone We discussed lifestyle modification and yoga as options He has hernia surgery pending.  I would advise that we stop amiodarone 3 months after this procedure.  2. HTN Elevated We discussed options today He does not wish to  have medicines increased We discussed sodium reduction, stress management, and use of CPAP as useful options No change required today  3. OSA Uses CPAP  4. SVT Well controlled with low dose amiodarone  Consider stopping amidoarone (as above)  5. Severe LA enlargement Stable No change required today   Risks, benefits and potential toxicities for medications prescribed and/or refilled reviewed with patient today.   Return in 6 months  Thompson Grayer MD, [facility] 03/08/2020 11:11 AM

## 2020-03-20 DIAGNOSIS — G4733 Obstructive sleep apnea (adult) (pediatric): Secondary | ICD-10-CM | POA: Diagnosis not present

## 2020-03-22 DIAGNOSIS — G4733 Obstructive sleep apnea (adult) (pediatric): Secondary | ICD-10-CM | POA: Diagnosis not present

## 2020-03-23 NOTE — Patient Instructions (Signed)
DUE TO COVID-19 ONLY ONE VISITOR IS ALLOWED TO COME WITH YOU AND STAY IN THE WAITING ROOM ONLY DURING PRE OP AND PROCEDURE DAY OF SURGERY. TWO VISITOR MAY VISIT WITH YOU AFTER SURGERY IN YOUR PRIVATE ROOM DURING VISITING HOURS ONLY!   10-a-8p  YOU NEED TO HAVE A COVID 19 TEST ON_7-16-21______ @_______ , THIS TEST MUST BE DONE BEFORE SURGERY, COME  [address], [place] , [address].  ([address]) ONCE YOUR COVID TEST IS COMPLETED, PLEASE BEGIN THE QUARANTINE INSTRUCTIONS AS OUTLINED IN YOUR HANDOUT.                Jerry Ruiz  03/23/2020   Your procedure is scheduled on: 03-28-20   Report to [facility] Main  Entrance   Report to admitting at      1:10 PM     Call this number if you have problems the morning of surgery [phone]    Remember: NO SOLID FOOD AFTER MIDNIGHT THE NIGHT PRIOR TO SURGERY. NOTHING BY MOUTH EXCEPT CLEAR LIQUIDS UNTIL   1200 pm . PLEASE FINISH ENSURE DRINK PER SURGEON ORDER  WHICH NEEDS TO BE COMPLETED AT      12:00 pm then nothing by mouth.    CLEAR LIQUID DIET   Foods Allowed                                                                               Foods Excluded  Coffee and tea, regular and decaf  No creamer                            liquids that you cannot  Plain Jell-O any favor except red or purple                                           see through such as: Fruit ices (not with fruit pulp)                                                             milk, soups, orange juice  Iced Popsicles                                                       All solid food Carbonated beverages, regular and diet                                    Cranberry, grape and apple juices Sports drinks like Gatorade Lightly seasoned clear broth or consume(fat free) Sugar, honey syrup  ____     BRUSH YOUR TEETH MORNING OF SURGERY AND  RINSE YOUR MOUTH OUT, NO CHEWING GUM CANDY OR MINTS.     Take these medicines the morning of  surgery with A SIP OF WATER: metoprolol, diltiazem, amiodarone                                 You may not have any metal on your body including hair pins and              piercings  Do not wear jewelry, , lotions, powders or perfumes, deodorant              Men may shave face and neck.   Do not bring valuables to the hospital. [place].  Contacts, dentures or bridgework may not be worn into surgery.      Patients discharged the day of surgery will not be allowed to drive home. IF YOU ARE HAVING SURGERY AND GOING HOME THE SAME DAY, YOU MUST HAVE AN ADULT TO DRIVE YOU HOME AND BE WITH YOU FOR 24 HOURS. YOU MAY GO HOME BY TAXI OR UBER OR ORTHERWISE, BUT AN ADULT MUST ACCOMPANY YOU HOME AND STAY WITH YOU FOR 24 HOURS.  Name and phone number of your driver:  Special Instructions: N/A              Please read over the following fact sheets you were given: _____________________________________________________________________             [facility] - Preparing for Surgery Before surgery, you can play an important role.  Because skin is not sterile, your skin needs to be as free of germs as possible.  You can reduce the number of germs on your skin by washing with CHG (chlorahexidine gluconate) soap before surgery.  CHG is an antiseptic cleaner which kills germs and bonds with the skin to continue killing germs even after washing. Please DO NOT use if you have an allergy to CHG or antibacterial soaps.  If your skin becomes reddened/irritated stop using the CHG and inform your nurse when you arrive at Short Stay. Do not shave (including legs and underarms) for at least 48 hours prior to the first CHG shower.  You may shave your face/neck. Please follow these instructions carefully:  1.  Shower with CHG Soap the night before surgery and the  morning of Surgery.  2.  If you choose to wash your hair, wash your hair first as usual with your  normal   shampoo.  3.  After you shampoo, rinse your hair and body thoroughly to remove the  shampoo.                           4.  Use CHG as you would any other liquid soap.  You can apply chg directly  to the skin and wash                       Gently with a scrungie or clean washcloth.  5.  Apply the CHG Soap to your body ONLY FROM THE NECK DOWN.   Do not use on face/ open                           Wound or open sores.  Avoid contact with eyes, ears mouth and genitals (private parts).                       Wash face,  Genitals (private parts) with your normal soap.             6.  Wash thoroughly, paying special attention to the area where your surgery  will be performed.  7.  Thoroughly rinse your body with warm water from the neck down.  8.  DO NOT shower/wash with your normal soap after using and rinsing off  the CHG Soap.                9.  Pat yourself dry with a clean towel.            10.  Wear clean pajamas.            11.  Place clean sheets on your bed the night of your first shower and do not  sleep with pets. Day of Surgery : Do not apply any lotions/deodorants the morning of surgery.  Please wear clean clothes to the hospital/surgery center.  FAILURE TO FOLLOW THESE INSTRUCTIONS MAY RESULT IN THE CANCELLATION OF YOUR SURGERY PATIENT SIGNATURE_________________________________  NURSE SIGNATURE__________________________________  ________________________________________________________________________

## 2020-03-23 NOTE — Progress Notes (Addendum)
PCP - Dr. Lajean Manes Cardiologist - Dr. Jerrilyn Cairo 03-08-20 epic, Dr. Irish Lack 10-05-19  PPM/ICD -  Device Orders -  Rep Notified -   Chest x-ray - I view 08-18-19 epic EKG - 03-08-20 epic Stress Test -  ECHO - 07-2019 epic Cardiac Cath -   Sleep Study -  CPAP - yes  Fasting Blood Sugar -  Checks Blood Sugar _____ times a day  Blood Thinner Instructions: Aspirin Instructions:  ERAS Protcol - PRE-SURGERY Ensure  COVID TEST- 7-16   Anesthesia review: Loop recorder, osa ,SVT  Patient denies shortness of breath, fever, cough and chest pain at PAT appointment NONE   All instructions explained to the patient, with a verbal understanding of the material. Patient agrees to go over the instructions while at home for a better understanding. Patient also instructed to self quarantine after being tested for COVID-19. The opportunity to ask questions was provided.

## 2020-03-24 ENCOUNTER — Other Ambulatory Visit (HOSPITAL_COMMUNITY)
Admission: RE | Admit: 2020-03-24 | Discharge: 2020-03-24 | Disposition: A | Discharge status: Home / Self Care | Payer: PPO | Source: Ambulatory Visit | Attending: General Surgery | Admitting: General Surgery

## 2020-03-24 ENCOUNTER — Other Ambulatory Visit (HOSPITAL_COMMUNITY): Payer: PPO

## 2020-03-24 ENCOUNTER — Other Ambulatory Visit: Payer: Self-pay

## 2020-03-24 ENCOUNTER — Encounter (HOSPITAL_COMMUNITY): Payer: Self-pay

## 2020-03-24 ENCOUNTER — Encounter (HOSPITAL_COMMUNITY)
Admission: RE | Admit: 2020-03-24 | Discharge: 2020-03-24 | Disposition: A | Discharge status: Home / Self Care | Payer: PPO | Source: Ambulatory Visit | Attending: General Surgery | Admitting: General Surgery

## 2020-03-24 DIAGNOSIS — Z20822 Contact with and (suspected) exposure to covid-19: Secondary | ICD-10-CM | POA: Diagnosis not present

## 2020-03-24 DIAGNOSIS — Z01812 Encounter for preprocedural laboratory examination: Secondary | ICD-10-CM | POA: Insufficient documentation

## 2020-03-24 HISTORY — DX: Calculus in bladder: N21.0

## 2020-03-24 HISTORY — DX: Sleep apnea, unspecified: G47.30

## 2020-03-24 HISTORY — DX: Headache, unspecified: R51.9

## 2020-03-24 HISTORY — DX: Unspecified malignant neoplasm of skin, unspecified: C44.90

## 2020-03-24 HISTORY — DX: Pneumonia, unspecified organism: J18.9

## 2020-03-24 HISTORY — DX: Unspecified osteoarthritis, unspecified site: M19.90

## 2020-03-24 LAB — BASIC METABOLIC PANEL
Anion gap: 9 (ref 5–15)
BUN: 20 mg/dL (ref 8–23)
CO2: 28 mmol/L (ref 22–32)
Calcium: 9.2 mg/dL (ref 8.9–10.3)
Chloride: 104 mmol/L (ref 98–111)
Creatinine, Ser: 1 mg/dL (ref 0.61–1.24)
GFR calc Af Amer: >60 mL/min (ref >60)
GFR calc non Af Amer: >60 mL/min (ref >60)
Glucose, Bld: 98 mg/dL (ref 70–99)
Potassium: 4.1 mmol/L (ref 3.5–5.1)
Sodium: 141 mmol/L (ref 135–145)

## 2020-03-24 LAB — CBC
HCT: 42.4 % (ref 39.0–52.0)
Hemoglobin: 14.1 g/dL (ref 13.0–17.0)
MCH: 30.2 pg (ref 26.0–34.0)
MCHC: 33.3 g/dL (ref 30.0–36.0)
MCV: 90.8 fL (ref 80.0–100.0)
Platelets: 240 10*3/uL (ref 150–400)
RBC: 4.67 MIL/uL (ref 4.22–5.81)
RDW: 13.5 % (ref 11.5–15.5)
WBC: 7.7 10*3/uL (ref 4.0–10.5)
nRBC: 0 % (ref 0.0–0.2)

## 2020-03-24 LAB — SARS CORONAVIRUS 2 (TAT 6-24 HRS): SARS Coronavirus 2: NEGATIVE

## 2020-03-25 DIAGNOSIS — G4733 Obstructive sleep apnea (adult) (pediatric): Secondary | ICD-10-CM | POA: Diagnosis not present

## 2020-03-27 ENCOUNTER — Encounter (HOSPITAL_COMMUNITY): Payer: Self-pay

## 2020-03-27 MED ORDER — BUPIVACAINE LIPOSOME 1.3 % IJ SUSP
20.0000 mL | INTRAMUSCULAR | Status: DC
  Filled 2020-03-27: qty 20

## 2020-03-27 NOTE — Progress Notes (Signed)
Instructed patient to arrive at 1230. Finish ensure by 1130. Patient verbalized understanding.

## 2020-03-27 NOTE — Progress Notes (Signed)
Anesthesia Chart Review   Case: [id number] Date/Time: 03/28/20 1415   Procedure: LAPAROSCOPIC BILATERAL INGUINAL HERNIA REPAIR WITH MESH (Bilateral )   Anesthesia type: General   Pre-op diagnosis: BILATERAL INGUINAL HERNIA   Location: [place] [facility] / [facility]   Surgeons: Kinsinger, Arta Bruce, MD      DISCUSSION:69 y.o. former smoker (quit 09/10/71) with h/o HTN, palpitations due to NSVT and PVCs (loop recorder in place), sleep anea with cpap, bilateral inguinal hernia scheduled for above procedure 03/28/2020 with Dr. Gurney Maxin.   BP has historically been difficult to manage, stable at the last cardio and EP visits without medication changes.   Pt followed by EP due to palpitations, last seen 03/08/2020.  Stable at this visit.    Last seen by cardiologist 11/05/2019, stable at this visit.   Anticipate pt can proceed with planned procedure barring acute status change.   VS: BP (!) 142/73    Pulse (!) 47    Temp 36.7 C (Oral)    Resp 16    Ht 6' (1.829 m)    Wt 98 kg    BMI 29.29 kg/m   PROVIDERS: Lajean Manes, MD is PCP   Thompson Grayer, MD is EP  Larae Grooms, MD is Cardiologist  LABS: Labs reviewed: Acceptable for surgery. (all labs ordered are listed, but only abnormal results are displayed)  Labs Reviewed  BASIC METABOLIC PANEL  CBC     IMAGES:   EKG: 03/08/2020 Rate 54 bpm Sinus brady RBBB LAFB  CV: Echo 08/02/2019 IMPRESSIONS    1. Left ventricular ejection fraction, by visual estimation, is 55 to  60%. The left ventricle has normal function. Left ventricular septal wall  thickness was moderately increased. Moderately increased left ventricular  posterior wall thickness. There is  moderately increased left ventricular hypertrophy.  2. Left ventricular diastolic parameters are consistent with Grade I  diastolic dysfunction (impaired relaxation).  3. Global right ventricle has normal systolic function.The right  ventricular size is normal. No  increase in right ventricular wall  thickness.  4. Left atrial size was severely dilated.  5. Right atrial size was normal.  6. The mitral valve is normal in structure. Trace mitral valve  regurgitation. No evidence of mitral stenosis.  7. The tricuspid valve is normal in structure. Tricuspid valve  regurgitation is not demonstrated.  8. The aortic valve is normal in structure. Aortic valve regurgitation is  trivial. No evidence of aortic valve sclerosis or stenosis.  9. The pulmonic valve was normal in structure. Pulmonic valve  regurgitation is not visualized.  10. TR signal is inadequate for assessing pulmonary artery systolic  pressure.  11. The inferior vena cava is dilated in size with <50% respiratory  variability, suggesting right atrial pressure of 15 mmHg Past Medical History:  Diagnosis Date   Arthritis    Bifascicular block    pvc's   Bladder stone    Headache    History of echocardiogram    a. Echo 12/15: EF 55-60%, no RWMA, mild LAE, trivial MR   History of echocardiogram    Echo 12/17: mod conc LVH, EF 55-60, no RWMA, Gr 1 DD, mild AI, calcified AV leaflets, trivial MR, mod LAE, normal RVSF, mild TR, no pericardial eff   HTN (hypertension)    Pneumonia    PVC's (premature ventricular contractions)    Skin cancer    melanoma bil ears   Sleep apnea    cpap    Past Surgical History:  Procedure Laterality  Date   HEMORROIDECTOMY     implantable loop recorder implantation  11/29/2019   Medtronic Reveal Linq model LNQ 22 (SN [id number]) implanted by Dr Rayann Heman for further evaluation of palpitations and arrhythmia management   KNEE SURGERY     arthroscopic bil   MOHS SURGERY     right big toe     bone removed   SHOULDER SURGERY     rotator right    TONSILLECTOMY     VASECTOMY     1987    MEDICATIONS:  amiodarone (PACERONE) 200 MG tablet   Arginine 1000 MG TABS   Cholecalciferol (VITAMIN D) 50 MCG (2000 UT) tablet   diltiazem  (DILACOR XR) 180 MG 24 hr capsule   FISH OIL-COENZYME Q10 PO   ibuprofen (ADVIL) 200 MG tablet   irbesartan (AVAPRO) 300 MG tablet   L-ARGININE PO   metoprolol succinate (TOPROL-XL) 50 MG 24 hr tablet   Misc Natural Products (PROSTATE SUPPORT PO)   Multiple Vitamin (MULTIVITAMIN) capsule   Probiotic Product (PROBIOTIC DAILY PO)   No current facility-administered medications for this encounter.    [START ON 03/28/2020] bupivacaine liposome (EXPAREL) 1.3 % injection 266 mg    Maia Plan [facility] Pre-Surgical Testing [phone] 03/27/20  2:46 PM

## 2020-03-28 ENCOUNTER — Encounter (HOSPITAL_COMMUNITY)
Admission: RE | Disposition: A | Discharge status: Home / Self Care | Payer: Self-pay | Source: Home / Self Care | Attending: General Surgery

## 2020-03-28 ENCOUNTER — Ambulatory Visit (HOSPITAL_COMMUNITY): Payer: PPO | Admitting: Physician Assistant

## 2020-03-28 ENCOUNTER — Ambulatory Visit (HOSPITAL_COMMUNITY)
Admission: RE | Admit: 2020-03-28 | Discharge: 2020-03-28 | Disposition: A | Discharge status: Home / Self Care | Payer: PPO | Attending: General Surgery | Admitting: General Surgery

## 2020-03-28 ENCOUNTER — Ambulatory Visit (HOSPITAL_COMMUNITY): Payer: PPO | Admitting: Anesthesiology

## 2020-03-28 ENCOUNTER — Encounter (HOSPITAL_COMMUNITY): Payer: Self-pay | Admitting: General Surgery

## 2020-03-28 DIAGNOSIS — I452 Bifascicular block: Secondary | ICD-10-CM | POA: Insufficient documentation

## 2020-03-28 DIAGNOSIS — G4733 Obstructive sleep apnea (adult) (pediatric): Secondary | ICD-10-CM | POA: Diagnosis not present

## 2020-03-28 DIAGNOSIS — I10 Essential (primary) hypertension: Secondary | ICD-10-CM | POA: Diagnosis not present

## 2020-03-28 DIAGNOSIS — I471 Supraventricular tachycardia: Secondary | ICD-10-CM | POA: Diagnosis not present

## 2020-03-28 DIAGNOSIS — Z85820 Personal history of malignant melanoma of skin: Secondary | ICD-10-CM | POA: Diagnosis not present

## 2020-03-28 DIAGNOSIS — Z79899 Other long term (current) drug therapy: Secondary | ICD-10-CM | POA: Insufficient documentation

## 2020-03-28 DIAGNOSIS — Z87891 Personal history of nicotine dependence: Secondary | ICD-10-CM | POA: Insufficient documentation

## 2020-03-28 DIAGNOSIS — K4020 Bilateral inguinal hernia, without obstruction or gangrene, not specified as recurrent: Secondary | ICD-10-CM | POA: Diagnosis not present

## 2020-03-28 DIAGNOSIS — G4730 Sleep apnea, unspecified: Secondary | ICD-10-CM | POA: Diagnosis not present

## 2020-03-28 DIAGNOSIS — M1990 Unspecified osteoarthritis, unspecified site: Secondary | ICD-10-CM | POA: Diagnosis not present

## 2020-03-28 DIAGNOSIS — Z8249 Family history of ischemic heart disease and other diseases of the circulatory system: Secondary | ICD-10-CM | POA: Diagnosis not present

## 2020-03-28 HISTORY — PX: INGUINAL HERNIA REPAIR: SHX194

## 2020-03-28 SURGERY — REPAIR, HERNIA, INGUINAL, BILATERAL, LAPAROSCOPIC
Anesthesia: General | Laterality: Bilateral

## 2020-03-28 MED ORDER — MIDAZOLAM HCL 5 MG/5ML IJ SOLN
INTRAMUSCULAR | Status: DC | PRN
  Administered 2020-03-28: 2 mg via INTRAVENOUS

## 2020-03-28 MED ORDER — GABAPENTIN 300 MG PO CAPS
300.0000 mg | ORAL_CAPSULE | ORAL | Status: AC
  Administered 2020-03-28: 300 mg via ORAL
  Filled 2020-03-28: qty 1

## 2020-03-28 MED ORDER — CEFAZOLIN SODIUM-DEXTROSE 2-4 GM/100ML-% IV SOLN
2.0000 g | INTRAVENOUS | Status: AC
  Administered 2020-03-28: 2 g via INTRAVENOUS
  Filled 2020-03-28: qty 100

## 2020-03-28 MED ORDER — BUPIVACAINE HCL 0.25 % IJ SOLN
INTRAMUSCULAR | Status: AC
  Filled 2020-03-28: qty 1

## 2020-03-28 MED ORDER — ORAL CARE MOUTH RINSE: 15.0000 mL | Freq: Once | OROMUCOSAL | Status: AC

## 2020-03-28 MED ORDER — FENTANYL CITRATE (PF) 250 MCG/5ML IJ SOLN
INTRAMUSCULAR | Status: AC
  Filled 2020-03-28: qty 5

## 2020-03-28 MED ORDER — OXYCODONE HCL 5 MG PO TABS
5.0000 mg | ORAL_TABLET | Freq: Four times a day (QID) | ORAL | 0 refills | Status: DC | PRN
Start: 2020-03-28 — End: 2020-10-30

## 2020-03-28 MED ORDER — BUPIVACAINE LIPOSOME 1.3 % IJ SUSP
INTRAMUSCULAR | Status: DC | PRN
  Administered 2020-03-28: 20 mL

## 2020-03-28 MED ORDER — EPHEDRINE SULFATE-NACL 50-0.9 MG/10ML-% IV SOSY
PREFILLED_SYRINGE | INTRAVENOUS | Status: DC | PRN
  Administered 2020-03-28: 10 mg via INTRAVENOUS

## 2020-03-28 MED ORDER — DEXAMETHASONE SODIUM PHOSPHATE 10 MG/ML IJ SOLN
INTRAMUSCULAR | Status: AC
  Filled 2020-03-28: qty 1

## 2020-03-28 MED ORDER — MIDAZOLAM HCL 2 MG/2ML IJ SOLN
INTRAMUSCULAR | Status: AC
  Filled 2020-03-28: qty 2

## 2020-03-28 MED ORDER — LIDOCAINE 2% (20 MG/ML) 5 ML SYRINGE
INTRAMUSCULAR | Status: AC
  Filled 2020-03-28: qty 5

## 2020-03-28 MED ORDER — CHLORHEXIDINE GLUCONATE CLOTH 2 % EX PADS: 6.0000 | MEDICATED_PAD | Freq: Once | CUTANEOUS | Status: DC

## 2020-03-28 MED ORDER — LACTATED RINGERS IV SOLN: INTRAVENOUS | Status: DC

## 2020-03-28 MED ORDER — ROCURONIUM BROMIDE 10 MG/ML (PF) SYRINGE
PREFILLED_SYRINGE | INTRAVENOUS | Status: AC
  Filled 2020-03-28: qty 10

## 2020-03-28 MED ORDER — KETOROLAC TROMETHAMINE 15 MG/ML IJ SOLN
15.0000 mg | INTRAMUSCULAR | Status: AC
  Administered 2020-03-28: 15 mg via INTRAVENOUS
  Filled 2020-03-28: qty 1

## 2020-03-28 MED ORDER — ONDANSETRON HCL 4 MG/2ML IJ SOLN
INTRAMUSCULAR | Status: AC
  Filled 2020-03-28: qty 2

## 2020-03-28 MED ORDER — OXYCODONE HCL 5 MG PO TABS
5.0000 mg | ORAL_TABLET | Freq: Once | ORAL | Status: AC
  Administered 2020-03-28: 5 mg via ORAL

## 2020-03-28 MED ORDER — ACETAMINOPHEN 500 MG PO TABS
1000.0000 mg | ORAL_TABLET | ORAL | Status: AC
  Administered 2020-03-28: 1000 mg via ORAL
  Filled 2020-03-28: qty 2

## 2020-03-28 MED ORDER — ONDANSETRON HCL 4 MG/2ML IJ SOLN
INTRAMUSCULAR | Status: DC | PRN
  Administered 2020-03-28: 4 mg via INTRAVENOUS

## 2020-03-28 MED ORDER — OXYCODONE HCL 5 MG PO TABS
ORAL_TABLET | ORAL | Status: AC
  Filled 2020-03-28: qty 1

## 2020-03-28 MED ORDER — CHLORHEXIDINE GLUCONATE 0.12 % MT SOLN
15.0000 mL | Freq: Once | OROMUCOSAL | Status: AC
  Administered 2020-03-28: 15 mL via OROMUCOSAL

## 2020-03-28 MED ORDER — ENSURE PRE-SURGERY PO LIQD: 296.0000 mL | Freq: Once | ORAL | Status: DC

## 2020-03-28 MED ORDER — HYDROMORPHONE HCL 1 MG/ML IJ SOLN: 0.2500 mg | INTRAMUSCULAR | Status: DC | PRN

## 2020-03-28 MED ORDER — IBUPROFEN 800 MG PO TABS
800.0000 mg | ORAL_TABLET | Freq: Three times a day (TID) | ORAL | 0 refills | Status: DC | PRN
Start: 2020-03-28 — End: 2024-03-19

## 2020-03-28 MED ORDER — LIDOCAINE 2% (20 MG/ML) 5 ML SYRINGE
INTRAMUSCULAR | Status: DC | PRN
  Administered 2020-03-28: 100 mg via INTRAVENOUS

## 2020-03-28 MED ORDER — FENTANYL CITRATE (PF) 100 MCG/2ML IJ SOLN
INTRAMUSCULAR | Status: DC | PRN
  Administered 2020-03-28: 100 ug via INTRAVENOUS

## 2020-03-28 MED ORDER — ROCURONIUM BROMIDE 10 MG/ML (PF) SYRINGE
PREFILLED_SYRINGE | INTRAVENOUS | Status: DC | PRN
  Administered 2020-03-28: 70 mg via INTRAVENOUS

## 2020-03-28 MED ORDER — 0.9 % SODIUM CHLORIDE (POUR BTL) OPTIME
TOPICAL | Status: DC | PRN
  Administered 2020-03-28: 1000 mL

## 2020-03-28 MED ORDER — BUPIVACAINE HCL (PF) 0.25 % IJ SOLN
INTRAMUSCULAR | Status: DC | PRN
  Administered 2020-03-28: 30 mL

## 2020-03-28 MED ORDER — PROPOFOL 10 MG/ML IV BOLUS
INTRAVENOUS | Status: AC
  Filled 2020-03-28: qty 20

## 2020-03-28 MED ORDER — SUGAMMADEX SODIUM 200 MG/2ML IV SOLN
INTRAVENOUS | Status: DC | PRN
  Administered 2020-03-28: 200 mg via INTRAVENOUS

## 2020-03-28 MED ORDER — PROPOFOL 10 MG/ML IV BOLUS
INTRAVENOUS | Status: DC | PRN
  Administered 2020-03-28: 200 mg via INTRAVENOUS

## 2020-03-28 SURGICAL SUPPLY — 41 items
APL PRP STRL LF DISP 70% ISPRP (MISCELLANEOUS) ×1
APL SKNCLS STERI-STRIP NONHPOA (GAUZE/BANDAGES/DRESSINGS) ×1
APPLIER CLIP 5 13 M/L LIGAMAX5 (MISCELLANEOUS)
APR CLP MED LRG 5 ANG JAW (MISCELLANEOUS)
BENZOIN TINCTURE PRP APPL 2/3 (GAUZE/BANDAGES/DRESSINGS) ×2 IMPLANT
BNDG ADH 1X3 SHEER STRL LF (GAUZE/BANDAGES/DRESSINGS) ×4 IMPLANT
BNDG ADH THN 3X1 STRL LF (GAUZE/BANDAGES/DRESSINGS) ×1
CABLE HIGH FREQUENCY MONO STRZ (ELECTRODE) ×2 IMPLANT
CHLORAPREP W/TINT 26 (MISCELLANEOUS) ×2 IMPLANT
CLIP APPLIE 5 13 M/L LIGAMAX5 (MISCELLANEOUS) IMPLANT
CLSR STERI-STRIP ANTIMIC 1/2X4 (GAUZE/BANDAGES/DRESSINGS) ×1 IMPLANT
COVER SURGICAL LIGHT HANDLE (MISCELLANEOUS) ×2 IMPLANT
COVER WAND RF STERILE (DRAPES) IMPLANT
DECANTER SPIKE VIAL GLASS SM (MISCELLANEOUS) ×2 IMPLANT
DEVICE SECURE STRAP 25 ABSORB (INSTRUMENTS) ×2 IMPLANT
DRAIN CHANNEL 19F RND (DRAIN) IMPLANT
ELECT REM PT RETURN 15FT ADLT (MISCELLANEOUS) ×2 IMPLANT
EVACUATOR SILICONE 100CC (DRAIN) IMPLANT
GLOVE BIO SURGEON STRL SZ7 (GLOVE) ×2 IMPLANT
GLOVE BIOGEL PI IND STRL 7.0 (GLOVE) ×1 IMPLANT
GLOVE BIOGEL PI INDICATOR 7.0 (GLOVE) ×1
GLOVE SURG SS PI 7.0 STRL IVOR (GLOVE) ×2 IMPLANT
GOWN STRL REUS W/TWL LRG LVL3 (GOWN DISPOSABLE) ×2 IMPLANT
GOWN STRL REUS W/TWL XL LVL3 (GOWN DISPOSABLE) ×4 IMPLANT
KIT BASIN OR (CUSTOM PROCEDURE TRAY) ×2 IMPLANT
KIT TURNOVER KIT A (KITS) IMPLANT
MESH 3DMAX 4X6 LT LRG (Mesh General) ×1 IMPLANT
MESH 3DMAX 4X6 RT LRG (Mesh General) ×1 IMPLANT
SCISSORS LAP 5X35 DISP (ENDOMECHANICALS) IMPLANT
SET IRRIG TUBING LAPAROSCOPIC (IRRIGATION / IRRIGATOR) ×1 IMPLANT
SET TUBE SMOKE EVAC HIGH FLOW (TUBING) ×2 IMPLANT
SLEEVE XCEL OPT CAN 5 100 (ENDOMECHANICALS) ×2 IMPLANT
STRIP CLOSURE SKIN 1/2X4 (GAUZE/BANDAGES/DRESSINGS) ×2 IMPLANT
SUT ETHILON 2 0 PS N (SUTURE) IMPLANT
SUT MNCRL AB 4-0 PS2 18 (SUTURE) ×2 IMPLANT
SUT VICRYL 0 UR6 27IN ABS (SUTURE) IMPLANT
TOWEL OR 17X26 10 PK STRL BLUE (TOWEL DISPOSABLE) ×2 IMPLANT
TRAY FOLEY MTR SLVR 16FR STAT (SET/KITS/TRAYS/PACK) IMPLANT
TRAY LAPAROSCOPIC (CUSTOM PROCEDURE TRAY) ×2 IMPLANT
TROCAR BLADELESS OPT 5 100 (ENDOMECHANICALS) ×2 IMPLANT
TROCAR XCEL 12X100 BLDLESS (ENDOMECHANICALS) ×2 IMPLANT

## 2020-03-28 NOTE — Op Note (Signed)
Preop diagnosis: bilateral inguinal hernia  Postop diagnosis: bilateral indirect inguinal hernia, right direct inguinal hernia  Procedure: laparoscopic Bilateral inguinal hernia repair with mesh  Surgeon: Gurney Maxin, M.D.  Asst: none  Anesthesia: Gen.   Indications for procedure: Jerry Ruiz is a 69 y.o. male with symptoms of pain and enlarging Bilateral inguinal hernia(s). After discussing risks, alternatives and benefits he decided on laparoscopic repair and was brought to day surgery for repair.  Description of procedure: The patient was brought into the operative suite, placed supine. Anesthesia was administered with endotracheal tube. Patient was strapped in place. The patient was prepped and draped in the usual sterile fashion.  Next Exparel:Marcaine mix was injected to the left of the umbilicus, and a transverse 2 cm incision was made. Dissection was used to visualize the anterior rectus sheath. Anterior rectus sheath was sharply incised, next to the 12 mm trocar was inserted into the preperitoneal space. Laparoscope was inserted to confirm placement and then used to bluntly dissect the retrorectus space clear. Once the midline ws free 2 49mm trocars were placed. 1 in the suprapubic area and 1in 5cm superior to the first.  On initial visualization , There was a moderate right direct inguinal hernia and moderate indirect right inguinal hernia. Next I began our dissection identifying the ASIS laterally and then working back medially removing the filmy tissue and adhesions of the peritoneum to the abdominal wall. Hernia sac was completely dissected out of the canal. Vas deference and contents of the cord were safely dissected away of the hernia sac. The Exparel mix was infused along the transversus abdominis planes laterally and near the lacunar ligament medially.  Next, attention was turned to the left side. There was a large left sided indirect inguinal hernia. I removed the filmy  tissue and reduced the hernia completed out of the canal. Vas deference and contents of the cord were safely dissected away of the hernia sac. The Exparel mix was infused along the transversus abdominis planes laterally and near the lacunar ligament medially.   A right large 3D max mesh was inserted and tacked medially to the lacunar ligament. A left large 3D max mesh was inserted and tacked medially to the lacunar ligament. The mesh was positioned flat and directly up against the direct and indirect areas.  The CO2 was evacuated while watching to ensure the mesh did not migrate.. The anterior rectus fascia was closed with 0 vicryl in interrupted sutures and all skin incisions were closed with 4-0 monocryl subcu stitch. The patient awoke from anesthesia and was brought to PACU in stable condition.  Findings: bilateral indirect inguinal hernia, right direct inguinal hernia  Specimen: none  Blood loss: 30 ml  Local anesthesia: 50 ml Exparel:Marcaine mix  Complications: none  Implant: right and left large Bard 3D max mesh  Gurney Maxin, M.D. General, Bariatric, & Minimally Invasive Surgery [facility], [place] 4:29 PM 03/28/2020

## 2020-03-28 NOTE — Anesthesia Preprocedure Evaluation (Signed)
Anesthesia Evaluation  Patient identified by MRN, date of birth, ID band Patient awake    Reviewed: Allergy & Precautions, NPO status , Patient's Chart, lab work & pertinent test results  Airway Mallampati: II  TM Distance: >3 FB Neck ROM: Full    Dental no notable dental hx.    Pulmonary sleep apnea and Continuous Positive Airway Pressure Ventilation , former smoker,    Pulmonary exam normal breath sounds clear to auscultation       Cardiovascular hypertension, Pt. on medications and Pt. on home beta blockers Normal cardiovascular exam+ Valvular Problems/Murmurs AI  Rhythm:Regular Rate:Normal     Neuro/Psych negative neurological ROS  negative psych ROS   GI/Hepatic negative GI ROS, Neg liver ROS,   Endo/Other  negative endocrine ROS  Renal/GU negative Renal ROS  negative genitourinary   Musculoskeletal negative musculoskeletal ROS (+)   Abdominal   Peds negative pediatric ROS (+)  Hematology negative hematology ROS (+)   Anesthesia Other Findings   Reproductive/Obstetrics negative OB ROS                             Anesthesia Physical Anesthesia Plan  ASA: III  Anesthesia Plan: General   Post-op Pain Management:    Induction: Intravenous  PONV Risk Score and Plan: 2 and Ondansetron, Dexamethasone and Treatment may vary due to age or medical condition  Airway Management Planned: Oral ETT  Additional Equipment:   Intra-op Plan:   Post-operative Plan: Extubation in OR  Informed Consent: I have reviewed the patients History and Physical, chart, labs and discussed the procedure including the risks, benefits and alternatives for the proposed anesthesia with the patient or authorized representative who has indicated his/her understanding and acceptance.     Dental advisory given  Plan Discussed with: CRNA and Surgeon  Anesthesia Plan Comments:         Anesthesia  Quick Evaluation

## 2020-03-28 NOTE — H&P (Signed)
Jerry Ruiz is an 69 y.o. male.   Chief Complaint: inguinal hernias HPI: 69 yo male with symptomatic bilateral inguinal hernias. He has pain with movement that has been increasing over the last months.  Past Medical History:  Diagnosis Date  . Arthritis   . Bifascicular block    pvc's  . Bladder stone   . Headache   . History of echocardiogram    a. Echo 12/15: EF 55-60%, no RWMA, mild LAE, trivial MR  . History of echocardiogram    Echo 12/17: mod conc LVH, EF 55-60, no RWMA, Gr 1 DD, mild AI, calcified AV leaflets, trivial MR, mod LAE, normal RVSF, mild TR, no pericardial eff  . HTN (hypertension)   . Pneumonia   . PVC's (premature ventricular contractions)   . Skin cancer    melanoma bil ears  . Sleep apnea    cpap    Past Surgical History:  Procedure Laterality Date  . HEMORROIDECTOMY    . implantable loop recorder implantation  11/29/2019   Medtronic Reveal Linq model LNQ 22 (SN [id number]) implanted by Dr Rayann Heman for further evaluation of palpitations and arrhythmia management  . KNEE SURGERY     arthroscopic bil  . MOHS SURGERY    . right big toe     bone removed  . SHOULDER SURGERY     rotator right   . TONSILLECTOMY    . VASECTOMY     1987    Family History  Problem Relation Age of Onset  . Arrhythmia Mother   . Hypertension Mother   . Heart disease Mother   . Parkinson's disease Father   . Hypertension Father    Social History:  reports that he quit smoking about 48 years ago. His smoking use included cigarettes. He quit after 5.00 years of use. He has never used smokeless tobacco. He reports current alcohol use. He reports that he does not use drugs.  Allergies:  Allergies  Allergen Reactions  . Amlodipine Other (See Comments)    Low energy   . Sulfa Antibiotics Rash, Hives and Itching    Medications Prior to Admission  Medication Sig Dispense Refill  . amiodarone (PACERONE) 200 MG tablet Take 100 mg by mouth daily.    . Cholecalciferol  (VITAMIN D) 50 MCG (2000 UT) tablet Take 2,000 Units by mouth every other day.    . diltiazem (DILACOR XR) 180 MG 24 hr capsule Take 180 mg by mouth at bedtime.     Marland Kitchen FISH OIL-COENZYME Q10 PO Take 1 capsule by mouth daily.     . irbesartan (AVAPRO) 300 MG tablet Take 1 tablet (300 mg total) by mouth daily. 90 tablet 3  . L-ARGININE PO Take 1,000 mg by mouth daily.    . metoprolol succinate (TOPROL-XL) 50 MG 24 hr tablet Take 25 mg by mouth daily. Take with or immediately following a meal.     . Misc Natural Products (PROSTATE SUPPORT PO) Take 2 tablets by mouth daily. Prostate Pro otc supplement    . Multiple Vitamin (MULTIVITAMIN) capsule Take 1 capsule by mouth daily.    . Probiotic Product (PROBIOTIC DAILY PO) Take 1 tablet by mouth at bedtime.     Marland Kitchen ibuprofen (ADVIL) 200 MG tablet Take 400 mg by mouth daily as needed for headache or moderate pain.      No results found for this or any previous visit (from the past 48 hour(s)). No results found.  Review of Systems  Blood pressure Marland Kitchen)  157/77, pulse (!) 50, temperature 98.3 F (36.8 C), temperature source Oral, resp. rate 16, SpO2 100 %. Physical Exam   Assessment/Plan 69 yo male with symptomatic bilateral inguinal hernias -lap bilateral inguinal hernia with mesh -ERAS protocol -planned outpatient procedure  Mickeal Skinner, MD 03/28/2020, 1:09 PM

## 2020-03-28 NOTE — Anesthesia Postprocedure Evaluation (Signed)
Anesthesia Post Note  Patient: Jerry Ruiz  Procedure(s) Performed: LAPAROSCOPIC BILATERAL INGUINAL HERNIA REPAIR WITH MESH (Bilateral )     Patient location during evaluation: PACU Anesthesia Type: General Level of consciousness: awake and alert Pain management: pain level controlled Vital Signs Assessment: post-procedure vital signs reviewed and stable Respiratory status: spontaneous breathing, nonlabored ventilation, respiratory function stable and patient connected to nasal cannula oxygen Cardiovascular status: blood pressure returned to baseline and stable Postop Assessment: no apparent nausea or vomiting Anesthetic complications: no   No complications documented.  Last Vitals:  Vitals:   03/28/20 1643 03/28/20 1645  BP: (!) 161/86 (!) 168/86  Pulse: 78 76  Resp: (!) 21 19  Temp: (!) 36.4 C   SpO2: 97% 99%    Last Pain:  Vitals:   03/28/20 1643  TempSrc:   PainSc: Asleep                 [name] S

## 2020-03-28 NOTE — Anesthesia Procedure Notes (Signed)
Procedure Name: Intubation Date/Time: 03/28/2020 3:19 PM Performed by: Gerald Leitz, CRNA Pre-anesthesia Checklist: Patient identified, Patient being monitored, Timeout performed, Emergency Drugs available and Suction available Patient Re-evaluated:Patient Re-evaluated prior to induction Oxygen Delivery Method: Circle system utilized Preoxygenation: Pre-oxygenation with 100% oxygen Induction Type: IV induction Ventilation: Mask ventilation without difficulty Laryngoscope Size: Mac and 3 Grade View: Grade I Tube type: Oral Tube size: 7.5 mm Number of attempts: 3 Airway Equipment and Method: Stylet and Video-laryngoscopy Placement Confirmation: ETT inserted through vocal cords under direct vision,  positive ETCO2 and breath sounds checked- equal and bilateral Secured at: 21 cm Tube secured with: Tape Dental Injury: Teeth and Oropharynx as per pre-operative assessment  Difficulty Due To: Difficult Airway- due to immobile epiglottis, Difficulty was unanticipated and Difficult Airway- due to anterior larynx Comments: Difficult moving epiglottis, attempted DL x 1, Bougie x 1, successful Glide scope #3 x 1

## 2020-03-28 NOTE — Transfer of Care (Signed)
Immediate Anesthesia Transfer of Care Note  Patient: Jerry Ruiz  Procedure(s) Performed: Procedure(s): LAPAROSCOPIC BILATERAL INGUINAL HERNIA REPAIR WITH MESH (Bilateral)  Patient Location: PACU  Anesthesia Type:General  Level of Consciousness: Alert, Awake, Oriented  Airway & Oxygen Therapy: Patient Spontanous Breathing  Post-op Assessment: Report given to RN  Post vital signs: Reviewed and stable  Last Vitals:  Vitals:   03/28/20 1230  BP: (!) 157/77  Pulse: (!) 50  Resp: 16  Temp: 36.8 C  SpO2: 381%    Complications: No apparent anesthesia complications

## 2020-03-29 ENCOUNTER — Encounter (HOSPITAL_COMMUNITY): Payer: Self-pay | Admitting: General Surgery

## 2020-04-05 DIAGNOSIS — L570 Actinic keratosis: Secondary | ICD-10-CM | POA: Diagnosis not present

## 2020-04-05 DIAGNOSIS — Z872 Personal history of diseases of the skin and subcutaneous tissue: Secondary | ICD-10-CM | POA: Diagnosis not present

## 2020-04-05 DIAGNOSIS — L821 Other seborrheic keratosis: Secondary | ICD-10-CM | POA: Diagnosis not present

## 2020-04-05 DIAGNOSIS — Z85828 Personal history of other malignant neoplasm of skin: Secondary | ICD-10-CM | POA: Diagnosis not present

## 2020-04-05 DIAGNOSIS — Z85820 Personal history of malignant melanoma of skin: Secondary | ICD-10-CM | POA: Diagnosis not present

## 2020-04-05 DIAGNOSIS — L578 Other skin changes due to chronic exposure to nonionizing radiation: Secondary | ICD-10-CM | POA: Diagnosis not present

## 2020-04-05 DIAGNOSIS — Z86018 Personal history of other benign neoplasm: Secondary | ICD-10-CM | POA: Diagnosis not present

## 2020-04-10 ENCOUNTER — Ambulatory Visit (INDEPENDENT_AMBULATORY_CARE_PROVIDER_SITE_OTHER): Payer: PPO | Admitting: *Deleted

## 2020-04-10 DIAGNOSIS — R002 Palpitations: Secondary | ICD-10-CM

## 2020-04-10 LAB — CUP PACEART REMOTE DEVICE CHECK
Date Time Interrogation Session: 20210730230407
Implantable Pulse Generator Implant Date: 20210322

## 2020-04-13 NOTE — Progress Notes (Signed)
Carelink Summary Report / Loop Recorder 

## 2020-04-17 ENCOUNTER — Telehealth: Payer: Self-pay | Admitting: Internal Medicine

## 2020-04-17 NOTE — Telephone Encounter (Signed)
°  Pt c/o medication issue:  1. Name of Medication:   amiodarone (PACERONE) 200 MG tablet   2. How are you currently taking this medication (dosage and times per day)? Half a tablet daily  3. Are you having a reaction (difficulty breathing--STAT)? no  4. What is your medication issue? Patient states at his last appointment stopping the medication was discussed, but Dr. Rayann Heman wanted to wait until after his hernia surgery. He states he is doing well and he would like to know if he can stop taking the medication now.

## 2020-04-19 NOTE — Telephone Encounter (Signed)
Called patient  Advised to stop Amiodarone 3 months after his procedure per Dr. Rayann Heman.   This date is 06/28/20. Patient verbalized understanding.

## 2020-04-24 DIAGNOSIS — I471 Supraventricular tachycardia: Secondary | ICD-10-CM | POA: Diagnosis not present

## 2020-04-24 DIAGNOSIS — G4733 Obstructive sleep apnea (adult) (pediatric): Secondary | ICD-10-CM | POA: Diagnosis not present

## 2020-04-24 DIAGNOSIS — Z1389 Encounter for screening for other disorder: Secondary | ICD-10-CM | POA: Diagnosis not present

## 2020-04-24 DIAGNOSIS — Z79899 Other long term (current) drug therapy: Secondary | ICD-10-CM | POA: Diagnosis not present

## 2020-04-24 DIAGNOSIS — E7800 Pure hypercholesterolemia, unspecified: Secondary | ICD-10-CM | POA: Diagnosis not present

## 2020-04-24 DIAGNOSIS — I10 Essential (primary) hypertension: Secondary | ICD-10-CM | POA: Diagnosis not present

## 2020-04-24 DIAGNOSIS — Z0000 Encounter for general adult medical examination without abnormal findings: Secondary | ICD-10-CM | POA: Diagnosis not present

## 2020-04-25 DIAGNOSIS — G4733 Obstructive sleep apnea (adult) (pediatric): Secondary | ICD-10-CM | POA: Diagnosis not present

## 2020-04-26 DIAGNOSIS — Z0000 Encounter for general adult medical examination without abnormal findings: Secondary | ICD-10-CM | POA: Diagnosis not present

## 2020-05-02 DIAGNOSIS — Z20822 Contact with and (suspected) exposure to covid-19: Secondary | ICD-10-CM | POA: Diagnosis not present

## 2020-05-05 ENCOUNTER — Encounter: Payer: Self-pay | Admitting: Nurse Practitioner

## 2020-05-05 ENCOUNTER — Other Ambulatory Visit (HOSPITAL_COMMUNITY): Payer: Self-pay | Admitting: Nurse Practitioner

## 2020-05-05 DIAGNOSIS — U071 COVID-19: Secondary | ICD-10-CM

## 2020-05-05 NOTE — Progress Notes (Signed)
I connected by phone with Jerry Ruiz on 05/05/2020 at 4:32 PM to discuss the potential use of an new treatment for mild to moderate COVID-19 viral infection in non-hospitalized patients.  This patient is a 69 y.o. male that meets the FDA criteria for Emergency Use Authorization of casirivimab\imdevimab.  Has a (+) direct SARS-CoV-2 viral test result  Has mild or moderate COVID-19   Is ? 69 years of age and weighs ? 40 kg  Is NOT hospitalized due to COVID-19  Is NOT requiring oxygen therapy or requiring an increase in baseline oxygen flow rate due to COVID-19  Is within 10 days of symptom onset  Has at least one of the high risk factor(s) for progression to severe COVID-19 and/or hospitalization as defined in EUA.  Specific high risk criteria : Older age (>/= 68 yo), hypertension, obesity  I have spoken and communicated the following to the patient or parent/caregiver:  1. FDA has authorized the emergency use of casirivimab\imdevimab for the treatment of mild to moderate COVID-19 in adults and pediatric patients with positive results of direct SARS-CoV-2 viral testing who are 60 years of age and older weighing at least 40 kg, and who are at high risk for progressing to severe COVID-19 and/or hospitalization.  2. The significant known and potential risks and benefits of casirivimab\imdevimab, and the extent to which such potential risks and benefits are unknown.  3. Information on available alternative treatments and the risks and benefits of those alternatives, including clinical trials.  4. Patients treated with casirivimab\imdevimab should continue to self-isolate and use infection control measures (e.g., wear mask, isolate, social distance, avoid sharing personal items, clean and disinfect "high touch" surfaces, and frequent handwashing) according to CDC guidelines.   5. The patient or parent/caregiver has the option to accept or refuse casirivimab\imdevimab .  After reviewing  this information with the patient, The patient agreed to proceed with receiving casirivimab\imdevimab infusion and will be provided a copy of the Fact sheet prior to receiving the infusion.Jerry Ruiz, [place], AGNP-C [phone] ([place])

## 2020-05-07 ENCOUNTER — Ambulatory Visit (HOSPITAL_COMMUNITY)
Admission: RE | Admit: 2020-05-07 | Discharge: 2020-05-07 | Disposition: A | Discharge status: Home / Self Care | Payer: Medicare Other | Source: Ambulatory Visit | Attending: Pulmonary Disease | Admitting: Pulmonary Disease

## 2020-05-07 DIAGNOSIS — U071 COVID-19: Secondary | ICD-10-CM

## 2020-05-07 DIAGNOSIS — Z23 Encounter for immunization: Secondary | ICD-10-CM | POA: Diagnosis not present

## 2020-05-07 MED ORDER — DIPHENHYDRAMINE HCL 50 MG/ML IJ SOLN: 50.0000 mg | Freq: Once | INTRAMUSCULAR | Status: DC | PRN

## 2020-05-07 MED ORDER — SODIUM CHLORIDE 0.9 % IV SOLN
1200.0000 mg | Freq: Once | INTRAVENOUS | Status: AC
  Administered 2020-05-07: 1200 mg via INTRAVENOUS
  Filled 2020-05-07: qty 10

## 2020-05-07 MED ORDER — EPINEPHRINE 0.3 MG/0.3ML IJ SOAJ: 0.3000 mg | Freq: Once | INTRAMUSCULAR | Status: DC | PRN

## 2020-05-07 MED ORDER — FAMOTIDINE IN NACL 20-0.9 MG/50ML-% IV SOLN: 20.0000 mg | Freq: Once | INTRAVENOUS | Status: DC | PRN

## 2020-05-07 MED ORDER — METHYLPREDNISOLONE SODIUM SUCC 125 MG IJ SOLR: 125.0000 mg | Freq: Once | INTRAMUSCULAR | Status: DC | PRN

## 2020-05-07 MED ORDER — SODIUM CHLORIDE 0.9 % IV SOLN: INTRAVENOUS | Status: DC | PRN

## 2020-05-07 MED ORDER — ALBUTEROL SULFATE HFA 108 (90 BASE) MCG/ACT IN AERS: 2.0000 | INHALATION_SPRAY | Freq: Once | RESPIRATORY_TRACT | Status: DC | PRN

## 2020-05-07 NOTE — Discharge Instructions (Signed)

## 2020-05-07 NOTE — Progress Notes (Signed)
  Diagnosis: COVID-19  Physician:Dr Wright   Procedure: [facility] Med: casirivimab\imdevimab infusion - Provided patient with casirivimab\imdevimab fact sheet for patients, parents and caregivers prior to infusion.  Complications: No immediate complications noted.  Discharge: Discharged home   Jerry Ruiz W 05/07/2020  

## 2020-05-11 ENCOUNTER — Ambulatory Visit (INDEPENDENT_AMBULATORY_CARE_PROVIDER_SITE_OTHER): Payer: PPO | Admitting: *Deleted

## 2020-05-11 DIAGNOSIS — R002 Palpitations: Secondary | ICD-10-CM

## 2020-05-11 LAB — CUP PACEART REMOTE DEVICE CHECK
Date Time Interrogation Session: 20210902160532
Implantable Pulse Generator Implant Date: 20210322

## 2020-05-12 ENCOUNTER — Telehealth: Payer: Self-pay | Admitting: *Deleted

## 2020-05-12 NOTE — Telephone Encounter (Signed)
Spoke with pt regarding symptom episodes noted on LINQ. Pt reports episodes correlate with palpitations, feels like his heart is "jumping around." Denies dizziness, chest pain, or other symptoms. Currently recovering from [place]. Symptoms started 8/22, tested positive on 8/24. Received Regeneron infusion 8/29. Pt reports that he feels better overall, but is still experiencing some fatigue and a cough. Reports compliance with amiodarone (currently scheduled to stop taking on 06/28/20), diltiazem, and metoprolol. Reports he hasn't been going to the gym due to his COVID diagnosis. Advised will discuss with Dr. Rayann Heman and call back with any new recommendations. Pt in agreement with plan and denies additional questions at this time.  Sent to Dr. Rayann Heman for review via result note.

## 2020-05-12 NOTE — Progress Notes (Signed)
Carelink Summary Report / Loop Recorder 

## 2020-05-17 NOTE — Telephone Encounter (Signed)
Advised pt that per Dr. Jackalyn Lombard result note from recent summary report, no new recommendations received. Pt verbalizes understanding and appreciation of call. Denies additional questions at this time.

## 2020-05-26 DIAGNOSIS — G4733 Obstructive sleep apnea (adult) (pediatric): Secondary | ICD-10-CM | POA: Diagnosis not present

## 2020-05-31 DIAGNOSIS — I10 Essential (primary) hypertension: Secondary | ICD-10-CM | POA: Diagnosis not present

## 2020-05-31 DIAGNOSIS — J42 Unspecified chronic bronchitis: Secondary | ICD-10-CM | POA: Diagnosis not present

## 2020-05-31 DIAGNOSIS — E7800 Pure hypercholesterolemia, unspecified: Secondary | ICD-10-CM | POA: Diagnosis not present

## 2020-06-05 ENCOUNTER — Other Ambulatory Visit: Payer: Self-pay | Admitting: General Surgery

## 2020-06-05 DIAGNOSIS — N5082 Scrotal pain: Secondary | ICD-10-CM

## 2020-06-05 DIAGNOSIS — K4091 Unilateral inguinal hernia, without obstruction or gangrene, recurrent: Secondary | ICD-10-CM

## 2020-06-12 ENCOUNTER — Ambulatory Visit
Admission: RE | Admit: 2020-06-12 | Discharge: 2020-06-12 | Disposition: A | Discharge status: Home / Self Care | Payer: Medicare Other | Source: Ambulatory Visit | Attending: General Surgery | Admitting: General Surgery

## 2020-06-12 ENCOUNTER — Ambulatory Visit
Admission: RE | Admit: 2020-06-12 | Discharge: 2020-06-12 | Disposition: A | Discharge status: Home / Self Care | Payer: PPO | Source: Ambulatory Visit | Attending: General Surgery | Admitting: General Surgery

## 2020-06-12 DIAGNOSIS — K4091 Unilateral inguinal hernia, without obstruction or gangrene, recurrent: Secondary | ICD-10-CM

## 2020-06-12 DIAGNOSIS — N5082 Scrotal pain: Secondary | ICD-10-CM

## 2020-06-12 DIAGNOSIS — R1909 Other intra-abdominal and pelvic swelling, mass and lump: Secondary | ICD-10-CM | POA: Diagnosis not present

## 2020-06-12 DIAGNOSIS — N433 Hydrocele, unspecified: Secondary | ICD-10-CM | POA: Diagnosis not present

## 2020-06-13 ENCOUNTER — Ambulatory Visit (INDEPENDENT_AMBULATORY_CARE_PROVIDER_SITE_OTHER): Payer: PPO

## 2020-06-13 DIAGNOSIS — R002 Palpitations: Secondary | ICD-10-CM

## 2020-06-13 LAB — CUP PACEART REMOTE DEVICE CHECK
Date Time Interrogation Session: 20211004230144
Implantable Pulse Generator Implant Date: 20210322

## 2020-06-16 NOTE — Progress Notes (Signed)
Carelink Summary Report / Loop Recorder 

## 2020-06-25 DIAGNOSIS — G4733 Obstructive sleep apnea (adult) (pediatric): Secondary | ICD-10-CM | POA: Diagnosis not present

## 2020-07-16 LAB — CUP PACEART REMOTE DEVICE CHECK
Date Time Interrogation Session: 20211106230151
Implantable Pulse Generator Implant Date: 20210322

## 2020-07-17 ENCOUNTER — Ambulatory Visit (INDEPENDENT_AMBULATORY_CARE_PROVIDER_SITE_OTHER): Payer: PPO

## 2020-07-17 DIAGNOSIS — R002 Palpitations: Secondary | ICD-10-CM

## 2020-07-18 NOTE — Progress Notes (Signed)
Carelink Summary Report / Loop Recorder 

## 2020-07-26 DIAGNOSIS — G4733 Obstructive sleep apnea (adult) (pediatric): Secondary | ICD-10-CM | POA: Diagnosis not present

## 2020-08-15 DIAGNOSIS — I10 Essential (primary) hypertension: Secondary | ICD-10-CM | POA: Diagnosis not present

## 2020-08-19 LAB — CUP PACEART REMOTE DEVICE CHECK
Date Time Interrogation Session: 20211210003135
Implantable Pulse Generator Implant Date: 20210322

## 2020-08-21 ENCOUNTER — Ambulatory Visit (INDEPENDENT_AMBULATORY_CARE_PROVIDER_SITE_OTHER): Payer: PPO

## 2020-08-21 DIAGNOSIS — R002 Palpitations: Secondary | ICD-10-CM | POA: Diagnosis not present

## 2020-08-22 NOTE — Progress Notes (Signed)
Cardiology Office Note   Date:  08/24/2020   ID:  Jerry Ruiz, DOB March 26, 1951, MRN [id number]  PCP:  Lajean Manes, MD    No chief complaint on file.  palpitations  Wt Readings from Last 3 Encounters:  08/24/20 229 lb 12.8 oz (104.2 kg)  03/24/20 216 lb (98 kg)  03/08/20 218 lb 9.6 oz (99.2 kg)       History of Present Illness: Jerry Ruiz is a 69 y.o. male  with a hx of Pericardial cyst noted on prior CT scan in 2013, HTN. Echocardiograms have not demonstrated pericardial cyst. Last echo 12/15.  He has had PVCs in the past.  BPs have been difficult to control. He was seen in the PharmD BP clinic back in 2017, but then went to PMD for a few years after that time. He had trouble with Hyzaar, losartan: "He notes significant side effects to Hyzaar. He was fatigued and started to notice diffuse arthralgias. His arthralgias have not really improved. However, his fatigue has improved since stopping Hyzaar.(in 2017)"  He has had Diltiazem started recently in 06/2019, and increased to 180 mg daily.  He has had some episodes of PVCs.   Had SVT in 08/2019, referred to EP.  HTN plan was :"High in the hospital. I think he will need more meds but he prefers to see the effects of CAP. Continue irbesartan. If BP remains High after starting CPAP for OSA, then would increase irbesartan to 300 mg daily."  He changed Dilt to 180 daily and Irbesartan 150 mg daily.    He was started on Amio for his SVT.  Noted some tremors in the morning when he is using his left hand. No tremor at rest.  Only with fine movement.  Amiodarone was planned to be stopped 3 months after hernia surgery in 2021.    Back exercising in a gym. Stopped testosterone.  Started CPAP in late 2020 and BPs improved.   Since the last visit, he has had some ringing in the ears.  Worse over the past few years.    He had COVID in 8/21.  He felt fatigue as he started to recover.  He lost taste and smell. He  gained some weight.   Dose was decreased to 100 mg daily of Amio.  He has had some tremor in the past few months.   Past Medical History:  Diagnosis Date  . Arthritis   . Bifascicular block    pvc's  . Bladder stone   . Headache   . History of echocardiogram    a. Echo 12/15: EF 55-60%, no RWMA, mild LAE, trivial MR  . History of echocardiogram    Echo 12/17: mod conc LVH, EF 55-60, no RWMA, Gr 1 DD, mild AI, calcified AV leaflets, trivial MR, mod LAE, normal RVSF, mild TR, no pericardial eff  . HTN (hypertension)   . Pneumonia   . PVC's (premature ventricular contractions)   . Skin cancer    melanoma bil ears  . Sleep apnea    cpap    Past Surgical History:  Procedure Laterality Date  . HEMORROIDECTOMY    . implantable loop recorder implantation  11/29/2019   Medtronic Reveal Linq model LNQ 22 (SN [id number]) implanted by Dr Rayann Heman for further evaluation of palpitations and arrhythmia management  . INGUINAL HERNIA REPAIR Bilateral 03/28/2020   Procedure: LAPAROSCOPIC BILATERAL INGUINAL HERNIA REPAIR WITH MESH;  Surgeon: Kinsinger, Arta Bruce, MD;  Location: [facility];  Service:  General;  Laterality: Bilateral;  . KNEE SURGERY     arthroscopic bil  . MOHS SURGERY    . right big toe     bone removed  . SHOULDER SURGERY     rotator right   . TONSILLECTOMY    . VASECTOMY     1987     Current Outpatient Medications  Medication Sig Dispense Refill  . amiodarone (PACERONE) 200 MG tablet Take 100 mg by mouth daily.    . Cholecalciferol (VITAMIN D) 50 MCG (2000 UT) tablet Take 2,000 Units by mouth every other day.    . diltiazem (DILACOR XR) 180 MG 24 hr capsule Take 180 mg by mouth at bedtime.     Marland Kitchen FISH OIL-COENZYME Q10 PO Take 1 capsule by mouth daily.     Marland Kitchen ibuprofen (ADVIL) 800 MG tablet Take 1 tablet (800 mg total) by mouth every 8 (eight) hours as needed. 30 tablet 0  . irbesartan (AVAPRO) 300 MG tablet Take 1 tablet (300 mg total) by mouth daily. 90 tablet 3  .  L-ARGININE PO Take 1,000 mg by mouth daily.    . metoprolol succinate (TOPROL-XL) 50 MG 24 hr tablet Take 25 mg by mouth daily. Take with or immediately following a meal.    . Misc Natural Products (PROSTATE SUPPORT PO) Take 2 tablets by mouth daily. Prostate Pro otc supplement    . Multiple Vitamin (MULTIVITAMIN) capsule Take 1 capsule by mouth daily.    Marland Kitchen oxyCODONE (OXY IR/ROXICODONE) 5 MG immediate release tablet Take 1 tablet (5 mg total) by mouth every 6 (six) hours as needed for severe pain. 20 tablet 0  . Probiotic Product (PROBIOTIC DAILY PO) Take 1 tablet by mouth at bedtime.      No current facility-administered medications for this visit.    Allergies:   Carvedilol, Cephalexin, Losartan potassium, Amlodipine, and Sulfa antibiotics    Social History:  The patient  reports that he quit smoking about 48 years ago. His smoking use included cigarettes. He quit after 5.00 years of use. He has never used smokeless tobacco. He reports current alcohol use. He reports that he does not use drugs.   Family History:  The patient's family history includes Arrhythmia in his mother; Heart disease in his mother; Hypertension in his father and mother; Parkinson's disease in his father.    ROS:  Please see the history of present illness.   Otherwise, review of systems are positive for weight gain.   All other systems are reviewed and negative.    PHYSICAL EXAM: VS:  BP (!) 152/90   Pulse 61   Ht 6' (1.829 m)   Wt 229 lb 12.8 oz (104.2 kg)   SpO2 97%   BMI 31.17 kg/m  , BMI Body mass index is 31.17 kg/m. GEN: Well nourished, well developed, in no acute distress  HEENT: normal  Neck: no JVD, carotid bruits, or masses Cardiac: RRR; no murmurs, rubs, or gallops,no edema  Respiratory:  clear to auscultation bilaterally, normal work of breathing GI: soft, nontender, nondistended, + BS MS: no deformity or atrophy  Skin: warm and dry, no rash Neuro:  Strength and sensation are intact Psych:  euthymic mood, full affect   EKG:   The ekg ordered 6/21 demonstrates sinus brady, RBBB   Recent Labs: 03/24/2020: BUN 20; Creatinine, Ser 1.00; Hemoglobin 14.1; Platelets 240; Potassium 4.1; Sodium 141   Lipid Panel    Component Value Date/Time   CHOL 153 05/24/2014 0927   TRIG  89 05/24/2014 0927   HDL 30 (L) 05/24/2014 0927   CHOLHDL 5.1 05/24/2014 0927   VLDL 18 05/24/2014 0927   LDLCALC 105 (H) 05/24/2014 0927     Other studies Reviewed: Additional studies/ records that were reviewed today with results demonstrating: A1C 5.9 in 2019; LDL 97 8/21.   ASSESSMENT AND PLAN:  1. HTN: Try to increase lifestyle measures of exercise and healthy diet.   We discussed more meds but I think he can get the BP down on his own.  WOuld have to consider a dose of HCTZ if BP stays up.  Limited by HR in terms of increasing diltiazem.  Low salt diet.  If BP stays up after a few months, would have to increase meds.  He wants to try to avoid additional pills.  2. SVT: He was on amiodarone around the time of hernia surgery in 2021.   3. PVCs:  Occasional.  Sx controlled.  4. ED: tried L=arginine. 5. Pericardial cyst: No chest pain.  6. Obesity: Whole food, plant based diet. PreDM in the past.  Increase walking and plant based diet.    Current medicines are reviewed at length with the patient today.  The patient concerns regarding his medicines were addressed.  The following changes have been made:  No change  Labs/ tests ordered today include:  No orders of the defined types were placed in this encounter.   Recommend 150 minutes/week of aerobic exercise Low fat, low carb, high fiber diet recommended  Disposition:   FU in 1 year   Signed, Larae Grooms, MD  08/24/2020 3:34 PM    [facility] [address], [place], [place]  [address] Phone: [phone]; Fax: [phone]

## 2020-08-23 DIAGNOSIS — I10 Essential (primary) hypertension: Secondary | ICD-10-CM | POA: Diagnosis not present

## 2020-08-23 DIAGNOSIS — J42 Unspecified chronic bronchitis: Secondary | ICD-10-CM | POA: Diagnosis not present

## 2020-08-23 DIAGNOSIS — E7800 Pure hypercholesterolemia, unspecified: Secondary | ICD-10-CM | POA: Diagnosis not present

## 2020-08-24 ENCOUNTER — Ambulatory Visit: Payer: PPO | Admitting: Interventional Cardiology

## 2020-08-24 ENCOUNTER — Other Ambulatory Visit: Payer: Self-pay

## 2020-08-24 ENCOUNTER — Encounter: Payer: Self-pay | Admitting: Interventional Cardiology

## 2020-08-24 VITALS — BP 152/90 | HR 61 | Ht 72.0 in | Wt 229.8 lb

## 2020-08-24 DIAGNOSIS — I471 Supraventricular tachycardia: Secondary | ICD-10-CM

## 2020-08-24 DIAGNOSIS — N529 Male erectile dysfunction, unspecified: Secondary | ICD-10-CM | POA: Diagnosis not present

## 2020-08-24 DIAGNOSIS — I493 Ventricular premature depolarization: Secondary | ICD-10-CM

## 2020-08-24 DIAGNOSIS — E669 Obesity, unspecified: Secondary | ICD-10-CM

## 2020-08-24 DIAGNOSIS — I10 Essential (primary) hypertension: Secondary | ICD-10-CM

## 2020-08-24 NOTE — Patient Instructions (Signed)
Medication Instructions:  Your physician recommends that you continue on your current medications as directed. Please refer to the Current Medication list given to you today.  *If you need a refill on your cardiac medications before your next appointment, please call your pharmacy*   Lab Work: none If you have labs (blood work) drawn today and your tests are completely normal, you will receive your results only by:  [place] (if you have MyChart) OR  A paper copy in the mail If you have any lab test that is abnormal or we need to change your treatment, we will call you to review the results.   Testing/Procedures: none   Follow-Up: At [facility], you and your health needs are our priority.  As part of our continuing mission to provide you with exceptional heart care, we have created designated Provider Care Teams.  These Care Teams include your primary Cardiologist (physician) and Advanced Practice Providers (APPs -  Physician Assistants and Nurse Practitioners) who all work together to provide you with the care you need, when you need it.  We recommend signing up for the patient portal called "MyChart".  Sign up information is provided on this After Visit Summary.  MyChart is used to connect with patients for Virtual Visits (Telemedicine).  Patients are able to view lab/test results, encounter notes, upcoming appointments, etc.  Non-urgent messages can be sent to your provider as well.   To learn more about what you can do with MyChart, go to [email or web address].    Your next appointment:   10 month(s)  The format for your next appointment:   In Person  Provider:   You may see Larae Grooms, MD or one of the following Advanced Practice Providers on your designated Care Team:    Melina Copa, PA-C  Ermalinda Barrios, PA-C    Other Instructions  Low-Sodium Eating Plan Sodium, which is an element that makes up salt, helps you maintain a healthy balance of fluids  in your body. Too much sodium can increase your blood pressure and cause fluid and waste to be held in your body. Your health care provider or dietitian may recommend following this plan if you have high blood pressure (hypertension), kidney disease, liver disease, or heart failure. Eating less sodium can help lower your blood pressure, reduce swelling, and protect your heart, liver, and kidneys. What are tips for following this plan? General guidelines  Most people on this plan should limit their sodium intake to 1,500-2,000 mg (milligrams) of sodium each day. Reading food labels   The Nutrition Facts label lists the amount of sodium in one serving of the food. If you eat more than one serving, you must multiply the listed amount of sodium by the number of servings.  Choose foods with less than 140 mg of sodium per serving.  Avoid foods with 300 mg of sodium or more per serving. Shopping  Look for lower-sodium products, often labeled as "low-sodium" or "no salt added."  Always check the sodium content even if foods are labeled as "unsalted" or "no salt added".  Buy fresh foods. ? Avoid canned foods and premade or frozen meals. ? Avoid canned, cured, or processed meats  Buy breads that have less than 80 mg of sodium per slice. Cooking  Eat more home-cooked food and less restaurant, buffet, and fast food.  Avoid adding salt when cooking. Use salt-free seasonings or herbs instead of table salt or sea salt. Check with your health care provider or pharmacist before  using salt substitutes.  Cook with plant-based oils, such as canola, sunflower, or olive oil. Meal planning  When eating at a restaurant, ask that your food be prepared with less salt or no salt, if possible.  Avoid foods that contain MSG (monosodium glutamate). MSG is sometimes added to [place] food, bouillon, and some canned foods. What foods are recommended? The items listed may not be a complete list. Talk with your  dietitian about what dietary choices are best for you. Grains Low-sodium cereals, including oats, puffed wheat and rice, and shredded wheat. Low-sodium crackers. Unsalted rice. Unsalted pasta. Low-sodium bread. Whole-grain breads and whole-grain pasta. Vegetables Fresh or frozen vegetables. "No salt added" canned vegetables. "No salt added" tomato sauce and paste. Low-sodium or reduced-sodium tomato and vegetable juice. Fruits Fresh, frozen, or canned fruit. Fruit juice. Meats and other protein foods Fresh or frozen (no salt added) meat, poultry, seafood, and fish. Low-sodium canned tuna and salmon. Unsalted nuts. Dried peas, beans, and lentils without added salt. Unsalted canned beans. Eggs. Unsalted nut butters. Dairy Milk. Soy milk. Cheese that is naturally low in sodium, such as ricotta cheese, fresh mozzarella, or Swiss cheese Low-sodium or reduced-sodium cheese. Cream cheese. Yogurt. Fats and oils Unsalted butter. Unsalted margarine with no trans fat. Vegetable oils such as canola or olive oils. Seasonings and other foods Fresh and dried herbs and spices. Salt-free seasonings. Low-sodium mustard and ketchup. Sodium-free salad dressing. Sodium-free light mayonnaise. Fresh or refrigerated horseradish. Lemon juice. Vinegar. Homemade, reduced-sodium, or low-sodium soups. Unsalted popcorn and pretzels. Low-salt or salt-free chips. What foods are not recommended? The items listed may not be a complete list. Talk with your dietitian about what dietary choices are best for you. Grains Instant hot cereals. Bread stuffing, pancake, and biscuit mixes. Croutons. Seasoned rice or pasta mixes. Noodle soup cups. Boxed or frozen macaroni and cheese. Regular salted crackers. Self-rising flour. Vegetables Sauerkraut, pickled vegetables, and relishes. Olives. [place] fries. Onion rings. Regular canned vegetables (not low-sodium or reduced-sodium). Regular canned tomato sauce and paste (not low-sodium or  reduced-sodium). Regular tomato and vegetable juice (not low-sodium or reduced-sodium). Frozen vegetables in sauces. Meats and other protein foods Meat or fish that is salted, canned, smoked, spiced, or pickled. Bacon, ham, sausage, hotdogs, corned beef, chipped beef, packaged lunch meats, salt pork, jerky, pickled herring, anchovies, regular canned tuna, sardines, salted nuts. Dairy Processed cheese and cheese spreads. Cheese curds. Blue cheese. Feta cheese. String cheese. Regular cottage cheese. Buttermilk. Canned milk. Fats and oils Salted butter. Regular margarine. Ghee. Bacon fat. Seasonings and other foods Onion salt, garlic salt, seasoned salt, table salt, and sea salt. Canned and packaged gravies. Worcestershire sauce. Tartar sauce. Barbecue sauce. Teriyaki sauce. Soy sauce, including reduced-sodium. Steak sauce. Fish sauce. Oyster sauce. Cocktail sauce. Horseradish that you find on the shelf. Regular ketchup and mustard. Meat flavorings and tenderizers. Bouillon cubes. Hot sauce and Tabasco sauce. Premade or packaged marinades. Premade or packaged taco seasonings. Relishes. Regular salad dressings. Salsa. Potato and tortilla chips. Corn chips and puffs. Salted popcorn and pretzels. Canned or dried soups. Pizza. Frozen entrees and pot pies. Summary  Eating less sodium can help lower your blood pressure, reduce swelling, and protect your heart, liver, and kidneys.  Most people on this plan should limit their sodium intake to 1,500-2,000 mg (milligrams) of sodium each day.  Canned, boxed, and frozen foods are high in sodium. Restaurant foods, fast foods, and pizza are also very high in sodium. You also get sodium by adding salt to food.  Try to cook at home, eat more fresh fruits and vegetables, and eat less fast food, canned, processed, or prepared foods. This information is not intended to replace advice given to you by your health care provider. Make sure you discuss any questions you have  with your health care provider. Document Revised: 08/08/2017 Document Reviewed: 08/19/2016 Elsevier Patient Education  [place].   High-Fiber Diet Fiber, also called dietary fiber, is a type of carbohydrate that is found in fruits, vegetables, whole grains, and beans. A high-fiber diet can have many health benefits. Your health care provider may recommend a high-fiber diet to help:  Prevent constipation. Fiber can make your bowel movements more regular.  Lower your cholesterol.  Relieve the following conditions: ? Swelling of veins in the anus (hemorrhoids). ? Swelling and irritation (inflammation) of specific areas of the digestive tract (uncomplicated diverticulosis). ? A problem of the large intestine (colon) that sometimes causes pain and diarrhea (irritable bowel syndrome, IBS).  Prevent overeating as part of a weight-loss plan.  Prevent heart disease, type 2 diabetes, and certain cancers. What is my plan? The recommended daily fiber intake in grams (g) includes:  38 g for men age 32 or younger.  30 g for men over age 57.  63 g for women age 22 or younger.  21 g for women over age 87. You can get the recommended daily intake of dietary fiber by:  Eating a variety of fruits, vegetables, grains, and beans.  Taking a fiber supplement, if it is not possible to get enough fiber through your diet. What do I need to know about a high-fiber diet?  It is better to get fiber through food sources rather than from fiber supplements. There is not a lot of research about how effective supplements are.  Always check the fiber content on the nutrition facts label of any prepackaged food. Look for foods that contain 5 g of fiber or more per serving.  Talk with a diet and nutrition specialist (dietitian) if you have questions about specific foods that are recommended or not recommended for your medical condition, especially if those foods are not listed below.  Gradually  increase how much fiber you consume. If you increase your intake of dietary fiber too quickly, you may have bloating, cramping, or gas.  Drink plenty of water. Water helps you to digest fiber. What are tips for following this plan?  Eat a wide variety of high-fiber foods.  Make sure that half of the grains that you eat each day are whole grains.  Eat breads and cereals that are made with whole-grain flour instead of refined flour or white flour.  Eat brown rice, bulgur wheat, or millet instead of white rice.  Start the day with a breakfast that is high in fiber, such as a cereal that contains 5 g of fiber or more per serving.  Use beans in place of meat in soups, salads, and pasta dishes.  Eat high-fiber snacks, such as berries, raw vegetables, nuts, and popcorn.  Choose whole fruits and vegetables instead of processed forms like juice or sauce. What foods can I eat?  Fruits Berries. Pears. Apples. Oranges. Avocado. Prunes and raisins. Dried figs. Vegetables Sweet potatoes. Spinach. Kale. Artichokes. Cabbage. Broccoli. Cauliflower. Green peas. Carrots. Squash. Grains Whole-grain breads. Multigrain cereal. Oats and oatmeal. Brown rice. Barley. Bulgur wheat. [place]. Quinoa. Bran muffins. Popcorn. [place]ye wafer crackers. Meats and other proteins [place], kidney, and pinto beans. Soybeans. Split peas. Lentils. Nuts and seeds.  Dairy Fiber-fortified yogurt. Beverages Fiber-fortified soy milk. Fiber-fortified orange juice. Other foods Fiber bars. The items listed above may not be a complete list of recommended foods and beverages. Contact a dietitian for more options. What foods are not recommended? Fruits Fruit juice. Cooked, strained fruit. Vegetables Fried potatoes. Canned vegetables. Well-cooked vegetables. Grains White bread. Pasta made with refined flour. White rice. Meats and other proteins Fatty cuts of meat. Fried chicken or fried fish. Dairy Milk. Yogurt. Cream cheese. Sour  cream. Fats and oils Butters. Beverages Soft drinks. Other foods Cakes and pastries. The items listed above may not be a complete list of foods and beverages to avoid. Contact a dietitian for more information. Summary  Fiber is a type of carbohydrate. It is found in fruits, vegetables, whole grains, and beans.  There are many health benefits of eating a high-fiber diet, such as preventing constipation, lowering blood cholesterol, helping with weight loss, and reducing your risk of heart disease, diabetes, and certain cancers.  Gradually increase your intake of fiber. Increasing too fast can result in cramping, bloating, and gas. Drink plenty of water while you increase your fiber.  The best sources of fiber include whole fruits and vegetables, whole grains, nuts, seeds, and beans. This information is not intended to replace advice given to you by your health care provider. Make sure you discuss any questions you have with your health care provider. Document Revised: 06/30/2017 Document Reviewed: 06/30/2017 Elsevier Patient Education  2020 [name].

## 2020-08-25 DIAGNOSIS — G4733 Obstructive sleep apnea (adult) (pediatric): Secondary | ICD-10-CM | POA: Diagnosis not present

## 2020-09-05 NOTE — Progress Notes (Signed)
Carelink Summary Report / Loop Recorder 

## 2020-09-25 ENCOUNTER — Ambulatory Visit (INDEPENDENT_AMBULATORY_CARE_PROVIDER_SITE_OTHER): Payer: PPO

## 2020-09-25 DIAGNOSIS — G4733 Obstructive sleep apnea (adult) (pediatric): Secondary | ICD-10-CM | POA: Diagnosis not present

## 2020-09-25 DIAGNOSIS — R002 Palpitations: Secondary | ICD-10-CM | POA: Diagnosis not present

## 2020-09-28 LAB — CUP PACEART REMOTE DEVICE CHECK
Date Time Interrogation Session: 20220111230201
Implantable Pulse Generator Implant Date: 20210322

## 2020-09-29 ENCOUNTER — Telehealth: Payer: Self-pay | Admitting: Internal Medicine

## 2020-09-29 MED ORDER — AMIODARONE HCL 200 MG PO TABS
100.0000 mg | ORAL_TABLET | Freq: Every day | ORAL | 3 refills | Status: DC
Start: 2020-09-29 — End: 2020-10-30

## 2020-09-29 NOTE — Telephone Encounter (Signed)
Pt c/o medication issue:  1. Name of Medication: amiodarone (PACERONE) 200 MG tablet  2. How are you currently taking this medication (dosage and times per day)? 100 mg (0.5 tablet) daily  3. Are you having a reaction (difficulty breathing--STAT)? no  4. What is your medication issue? Pt states he is out of refills. Pt wanted to know if Dr. Rayann Heman wanted him to stay on this medication or not. IT was mentioned at a previous appointment that the patient may not need to take this anymore. Please advise

## 2020-10-04 DIAGNOSIS — L570 Actinic keratosis: Secondary | ICD-10-CM | POA: Diagnosis not present

## 2020-10-04 DIAGNOSIS — L814 Other melanin hyperpigmentation: Secondary | ICD-10-CM | POA: Diagnosis not present

## 2020-10-04 DIAGNOSIS — L821 Other seborrheic keratosis: Secondary | ICD-10-CM | POA: Diagnosis not present

## 2020-10-05 DIAGNOSIS — I10 Essential (primary) hypertension: Secondary | ICD-10-CM | POA: Diagnosis not present

## 2020-10-05 DIAGNOSIS — J42 Unspecified chronic bronchitis: Secondary | ICD-10-CM | POA: Diagnosis not present

## 2020-10-05 DIAGNOSIS — E7800 Pure hypercholesterolemia, unspecified: Secondary | ICD-10-CM | POA: Diagnosis not present

## 2020-10-09 ENCOUNTER — Encounter: Payer: PPO | Admitting: Internal Medicine

## 2020-10-09 NOTE — Progress Notes (Signed)
Carelink Summary Report / Loop Recorder 

## 2020-10-17 DIAGNOSIS — I471 Supraventricular tachycardia: Secondary | ICD-10-CM | POA: Diagnosis not present

## 2020-10-17 DIAGNOSIS — J42 Unspecified chronic bronchitis: Secondary | ICD-10-CM | POA: Diagnosis not present

## 2020-10-17 DIAGNOSIS — I10 Essential (primary) hypertension: Secondary | ICD-10-CM | POA: Diagnosis not present

## 2020-10-25 LAB — CUP PACEART REMOTE DEVICE CHECK
Date Time Interrogation Session: 20220213230355
Implantable Pulse Generator Implant Date: 20210322

## 2020-10-30 ENCOUNTER — Ambulatory Visit: Payer: PPO | Admitting: Internal Medicine

## 2020-10-30 ENCOUNTER — Encounter: Payer: Self-pay | Admitting: Internal Medicine

## 2020-10-30 ENCOUNTER — Other Ambulatory Visit: Payer: Self-pay

## 2020-10-30 ENCOUNTER — Ambulatory Visit (INDEPENDENT_AMBULATORY_CARE_PROVIDER_SITE_OTHER): Payer: PPO

## 2020-10-30 VITALS — BP 158/86 | HR 54 | Ht 72.0 in | Wt 231.4 lb

## 2020-10-30 DIAGNOSIS — R002 Palpitations: Secondary | ICD-10-CM | POA: Diagnosis not present

## 2020-10-30 DIAGNOSIS — G4733 Obstructive sleep apnea (adult) (pediatric): Secondary | ICD-10-CM

## 2020-10-30 DIAGNOSIS — I471 Supraventricular tachycardia: Secondary | ICD-10-CM

## 2020-10-30 DIAGNOSIS — I493 Ventricular premature depolarization: Secondary | ICD-10-CM | POA: Diagnosis not present

## 2020-10-30 DIAGNOSIS — I10 Essential (primary) hypertension: Secondary | ICD-10-CM | POA: Diagnosis not present

## 2020-10-30 NOTE — Patient Instructions (Addendum)
Medication Instructions:  Stop your Amiodarone Your physician recommends that you continue on your current medications as directed. Please refer to the Current Medication list given to you today.  Labwork: None ordered.  Testing/Procedures: None ordered.  Follow-Up: Your physician wants you to follow-up in: 07/30/21 at 9:30 am with Dr. Rayann Heman.  Any Other Special Instructions Will Be Listed Below (If Applicable).  If you need a refill on your cardiac medications before your next appointment, please call your pharmacy.

## 2020-10-30 NOTE — Progress Notes (Signed)
PCP: Lajean Manes, MD Primary Cardiologist: Dr Irish Lack Primary EP: Dr Rayann Heman  Jerry Ruiz is a 70 y.o. male who presents today for routine electrophysiology followup.  Since last being seen in our clinic, the patient reports doing very well.  He has fatigue and unsteadiness which he attributes to amiodarone.  Arrhythmias are well controlled.  Today, he denies symptoms of palpitations, chest pain, shortness of breath,  lower extremity edema, dizziness, presyncope, or syncope.  The patient is otherwise without complaint today.   Past Medical History:  Diagnosis Date  . Arthritis   . Bifascicular block    pvc's  . Bladder stone   . Headache   . History of echocardiogram    a. Echo 12/15: EF 55-60%, no RWMA, mild LAE, trivial MR  . History of echocardiogram    Echo 12/17: mod conc LVH, EF 55-60, no RWMA, Gr 1 DD, mild AI, calcified AV leaflets, trivial MR, mod LAE, normal RVSF, mild TR, no pericardial eff  . HTN (hypertension)   . Pneumonia   . PVC's (premature ventricular contractions)   . Skin cancer    melanoma bil ears  . Sleep apnea    cpap   Past Surgical History:  Procedure Laterality Date  . HEMORROIDECTOMY    . implantable loop recorder implantation  11/29/2019   Medtronic Reveal Linq model LNQ 22 (SN [id number]) implanted by Dr Rayann Heman for further evaluation of palpitations and arrhythmia management  . INGUINAL HERNIA REPAIR Bilateral 03/28/2020   Procedure: LAPAROSCOPIC BILATERAL INGUINAL HERNIA REPAIR WITH MESH;  Surgeon: Kinsinger, Arta Bruce, MD;  Location: [facility];  Service: General;  Laterality: Bilateral;  . KNEE SURGERY     arthroscopic bil  . MOHS SURGERY    . right big toe     bone removed  . SHOULDER SURGERY     rotator right   . TONSILLECTOMY    . VASECTOMY     1987    ROS- all systems are reviewed and negatives except as per HPI above  Current Outpatient Medications  Medication Sig Dispense Refill  . amiodarone (PACERONE) 200 MG tablet Take 0.5  tablets (100 mg total) by mouth daily. 45 tablet 3  . Cholecalciferol (VITAMIN D) 50 MCG (2000 UT) tablet Take 2,000 Units by mouth every other day.    . diltiazem (DILACOR XR) 240 MG 24 hr capsule Take 1 tablet by mouth daily.    Marland Kitchen FISH OIL-COENZYME Q10 PO Take 1 capsule by mouth daily.     Marland Kitchen ibuprofen (ADVIL) 800 MG tablet Take 1 tablet (800 mg total) by mouth every 8 (eight) hours as needed. 30 tablet 0  . irbesartan (AVAPRO) 300 MG tablet Take 1 tablet (300 mg total) by mouth daily. 90 tablet 3  . L-ARGININE PO Take 1,000 mg by mouth daily.    . metoprolol succinate (TOPROL-XL) 50 MG 24 hr tablet Take 25 mg by mouth daily. Take with or immediately following a meal.    . Misc Natural Products (PROSTATE SUPPORT PO) Take 2 tablets by mouth daily. Prostate Pro otc supplement    . Multiple Vitamin (MULTIVITAMIN) capsule Take 1 capsule by mouth daily.    . Probiotic Product (PROBIOTIC DAILY PO) Take 1 tablet by mouth at bedtime.      No current facility-administered medications for this visit.    Physical Exam: Vitals:   10/30/20 1309  BP: (!) 158/86  Pulse: (!) 54  SpO2: 96%  Weight: 231 lb 6.4 oz (105 kg)  Height: 6' (1.829 m)    GEN- The patient is well appearing, alert and oriented x 3 today.   Head- normocephalic, atraumatic Eyes-  Sclera clear, conjunctiva pink Ears- hearing intact Oropharynx- clear Lungs-   normal work of breathing Heart- Regular rate and rhythm  GI- soft  Extremities- no clubbing, cyanosis, or edema  Wt Readings from Last 3 Encounters:  10/30/20 231 lb 6.4 oz (105 kg)  08/24/20 229 lb 12.8 oz (104.2 kg)  03/24/20 216 lb (98 kg)    EKG tracing ordered today is personally reviewed and shows sinus bradycardia 54 bpm, RBBB, LAHB  Assessment and Plan:  1. Palpitations Well controlled PVC burden 0.1% SVT without recent recurrent He has severe LA enlargement but no afib history Stop amiodarone today Continue to follow with ILR  2.  HTN Elevated Given bifascicular block and bradycardia, I would NOT favor further increases in diltiazem. If heart rates do not improve off of amiodarone I would advise reducing diltaizem to 120mg  daily and starting spironolactone for BP management  3. Obesity Body mass index is 31.38 kg/m. lifesytle modification advised  4. OSA Uses cPAP  Risks, benefits and potential toxicities for medications prescribed and/or refilled reviewed with patient today.   Follow-up with Dr Irish Lack as scheduled Return to see me in 9 months  Thompson Grayer MD, [facility] 10/30/2020 1:16 PM

## 2020-11-06 DIAGNOSIS — I10 Essential (primary) hypertension: Secondary | ICD-10-CM | POA: Diagnosis not present

## 2020-11-06 NOTE — Progress Notes (Signed)
Carelink Summary Report / Loop Recorder 

## 2020-11-07 DIAGNOSIS — G4733 Obstructive sleep apnea (adult) (pediatric): Secondary | ICD-10-CM | POA: Diagnosis not present

## 2020-11-30 DIAGNOSIS — I10 Essential (primary) hypertension: Secondary | ICD-10-CM | POA: Diagnosis not present

## 2020-11-30 DIAGNOSIS — E7800 Pure hypercholesterolemia, unspecified: Secondary | ICD-10-CM | POA: Diagnosis not present

## 2020-11-30 DIAGNOSIS — J42 Unspecified chronic bronchitis: Secondary | ICD-10-CM | POA: Diagnosis not present

## 2020-12-02 LAB — CUP PACEART REMOTE DEVICE CHECK
Date Time Interrogation Session: 20220318230534
Implantable Pulse Generator Implant Date: 20210322

## 2020-12-04 ENCOUNTER — Ambulatory Visit (INDEPENDENT_AMBULATORY_CARE_PROVIDER_SITE_OTHER): Payer: PPO

## 2020-12-04 DIAGNOSIS — R002 Palpitations: Secondary | ICD-10-CM | POA: Diagnosis not present

## 2020-12-07 DIAGNOSIS — I10 Essential (primary) hypertension: Secondary | ICD-10-CM | POA: Diagnosis not present

## 2020-12-15 NOTE — Progress Notes (Signed)
Carelink Summary Report / Loop Recorder 

## 2020-12-19 DIAGNOSIS — R5383 Other fatigue: Secondary | ICD-10-CM | POA: Diagnosis not present

## 2020-12-19 DIAGNOSIS — I10 Essential (primary) hypertension: Secondary | ICD-10-CM | POA: Diagnosis not present

## 2020-12-19 DIAGNOSIS — I471 Supraventricular tachycardia: Secondary | ICD-10-CM | POA: Diagnosis not present

## 2020-12-19 DIAGNOSIS — Z79899 Other long term (current) drug therapy: Secondary | ICD-10-CM | POA: Diagnosis not present

## 2021-01-05 DIAGNOSIS — I10 Essential (primary) hypertension: Secondary | ICD-10-CM | POA: Diagnosis not present

## 2021-01-08 ENCOUNTER — Ambulatory Visit (INDEPENDENT_AMBULATORY_CARE_PROVIDER_SITE_OTHER): Payer: PPO

## 2021-01-08 DIAGNOSIS — R002 Palpitations: Secondary | ICD-10-CM | POA: Diagnosis not present

## 2021-01-09 LAB — CUP PACEART REMOTE DEVICE CHECK
Date Time Interrogation Session: 20220430230310
Implantable Pulse Generator Implant Date: 20210322

## 2021-01-19 ENCOUNTER — Other Ambulatory Visit: Payer: Self-pay | Admitting: Interventional Cardiology

## 2021-01-19 DIAGNOSIS — I10 Essential (primary) hypertension: Secondary | ICD-10-CM | POA: Diagnosis not present

## 2021-01-19 DIAGNOSIS — E7800 Pure hypercholesterolemia, unspecified: Secondary | ICD-10-CM | POA: Diagnosis not present

## 2021-01-19 DIAGNOSIS — J42 Unspecified chronic bronchitis: Secondary | ICD-10-CM | POA: Diagnosis not present

## 2021-01-26 NOTE — Progress Notes (Signed)
Carelink Summary Report / Loop Recorder 

## 2021-01-29 DIAGNOSIS — Z570 Occupational exposure to noise: Secondary | ICD-10-CM | POA: Diagnosis not present

## 2021-01-29 DIAGNOSIS — H903 Sensorineural hearing loss, bilateral: Secondary | ICD-10-CM | POA: Diagnosis not present

## 2021-01-29 DIAGNOSIS — H9313 Tinnitus, bilateral: Secondary | ICD-10-CM | POA: Diagnosis not present

## 2021-02-06 DIAGNOSIS — I10 Essential (primary) hypertension: Secondary | ICD-10-CM | POA: Diagnosis not present

## 2021-02-11 LAB — CUP PACEART REMOTE DEVICE CHECK
Date Time Interrogation Session: 20220602230704
Implantable Pulse Generator Implant Date: 20210322

## 2021-02-12 ENCOUNTER — Ambulatory Visit (INDEPENDENT_AMBULATORY_CARE_PROVIDER_SITE_OTHER): Payer: PPO

## 2021-02-12 DIAGNOSIS — R002 Palpitations: Secondary | ICD-10-CM | POA: Diagnosis not present

## 2021-02-22 ENCOUNTER — Telehealth: Payer: Self-pay

## 2021-02-22 NOTE — Telephone Encounter (Signed)
Spoke with patient. Patient felt like he had an episode 06/13- 02/20/21. LINQ transmission received on 02/22/21 showed no new episodes or alerts. PVC burden at 0.9%.

## 2021-02-22 NOTE — Telephone Encounter (Signed)
On Tuesday morning he had an episode. He felt like he had an episode of pvc's. Pt states it felt like svt's. I let him speak with Janett Billow, rn.

## 2021-03-05 IMAGING — US US PELVIS LIMITED
1 series · 13 of 13 positions shown · non-contrast
Comparison: None.

CLINICAL DATA: Lump in the left groin status post left inguinal
hernia repair.

EXAM:
LIMITED ULTRASOUND OF PELVIS
TECHNIQUE: Limited transabdominal ultrasound examination of the pelvis was
performed.

[Series 1: us pelvis limited · 0.14mm/px · 13 acquisitions, 13 frames shown]
[im 1/13]
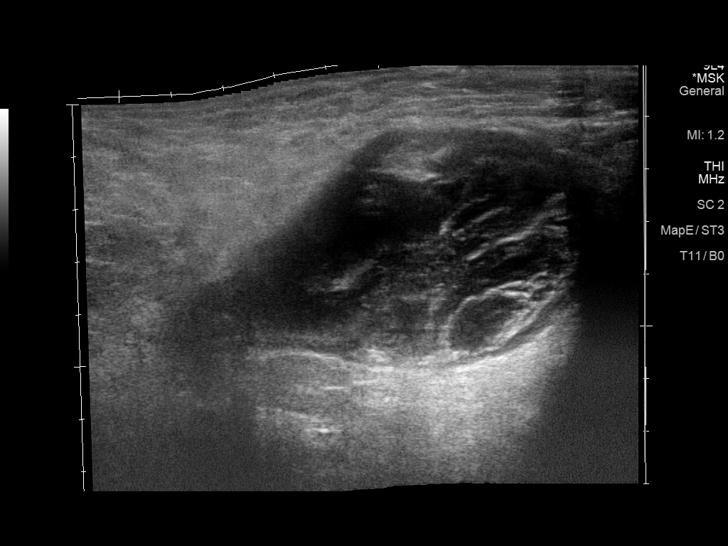
[im 2/13]
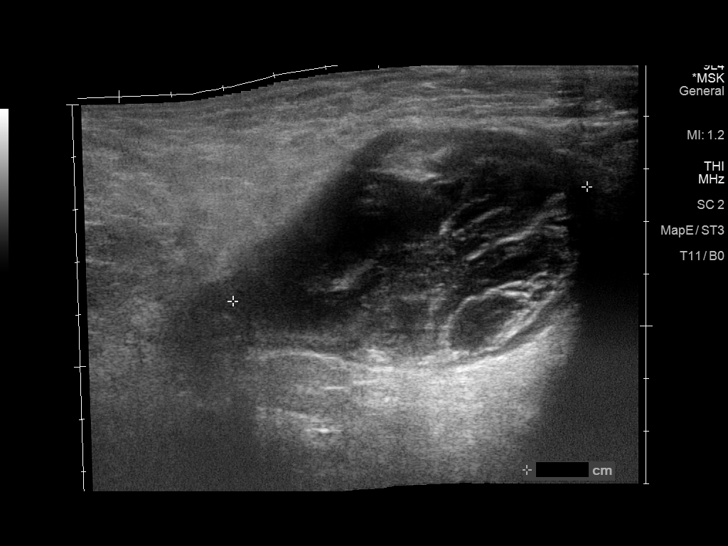
[im 3/13]
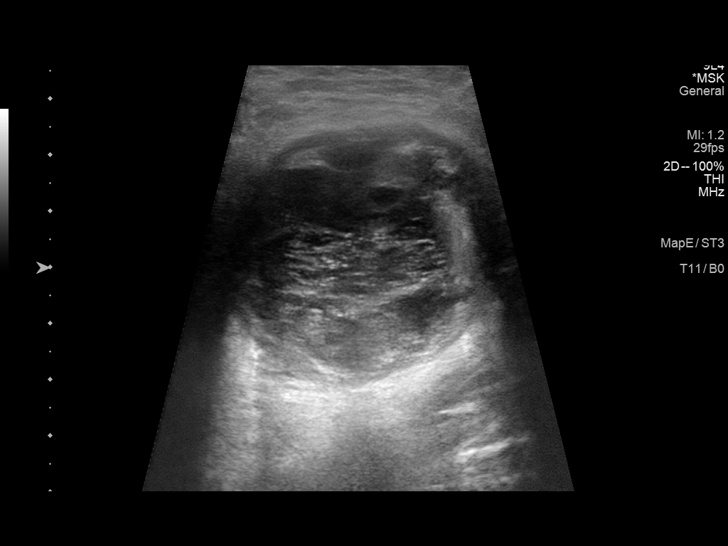
[im 4/13]
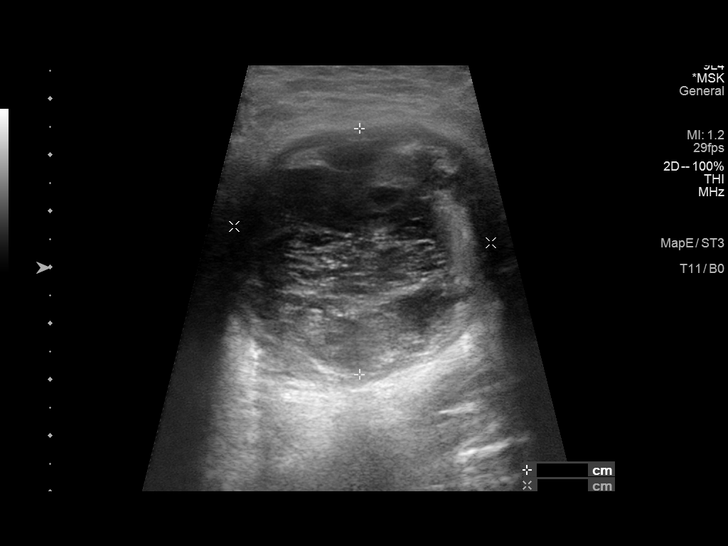
[im 5/13]
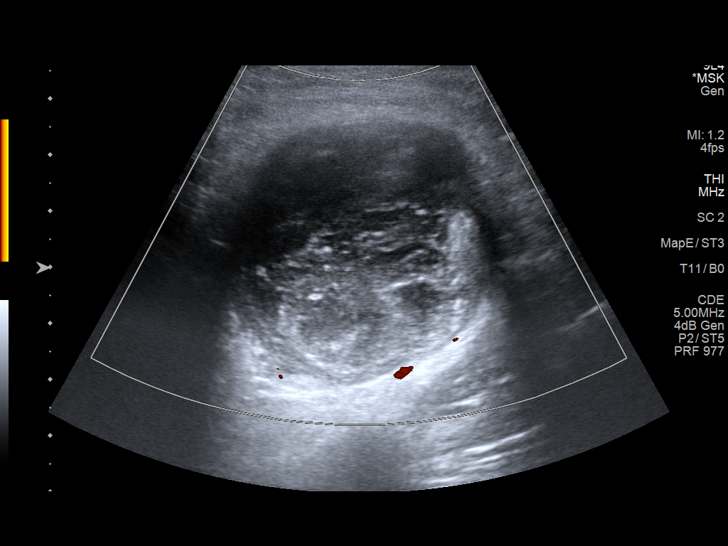
[im 6/13]
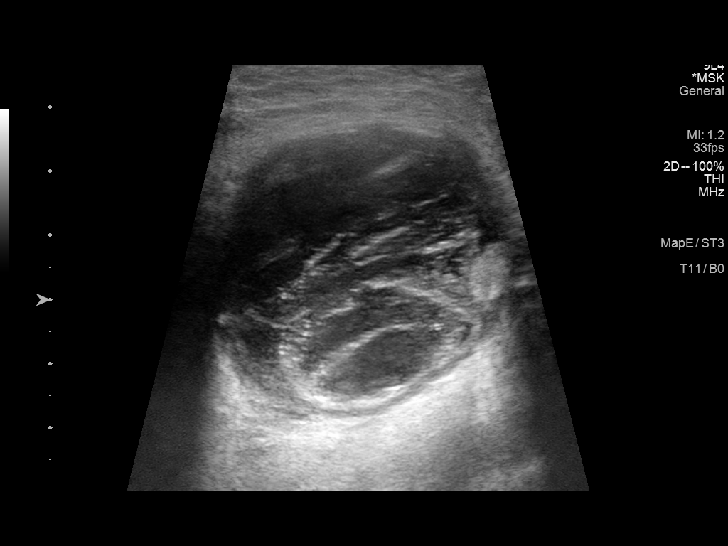
[im 7/13]
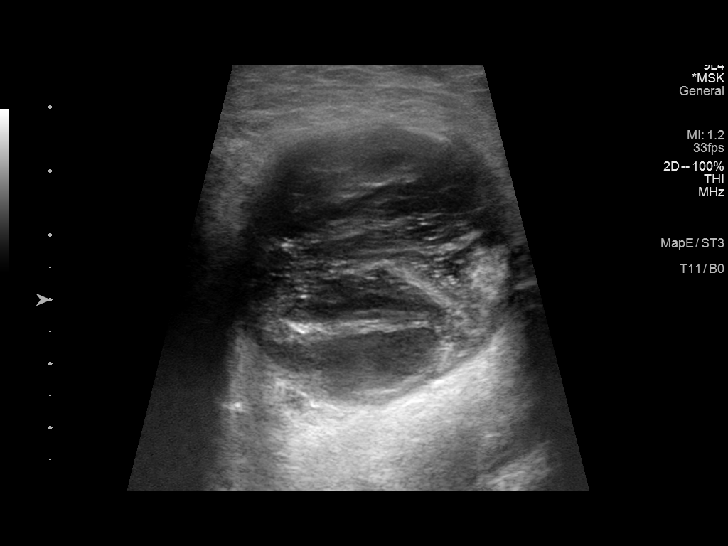
[im 8/13]
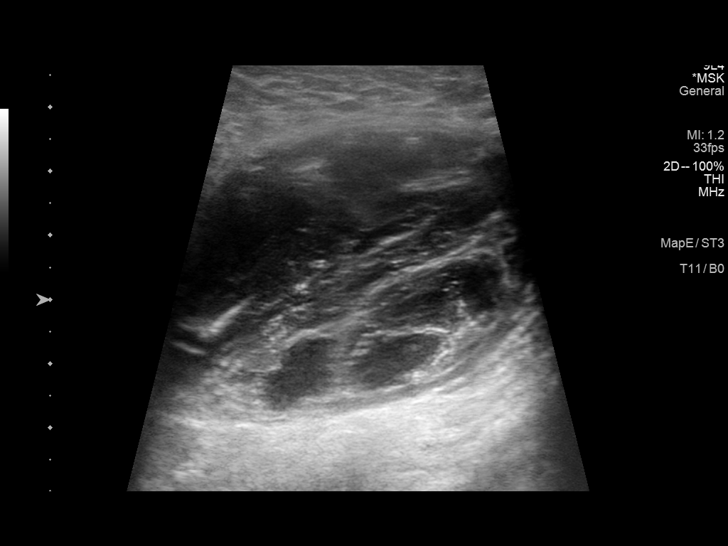
[im 9/13]
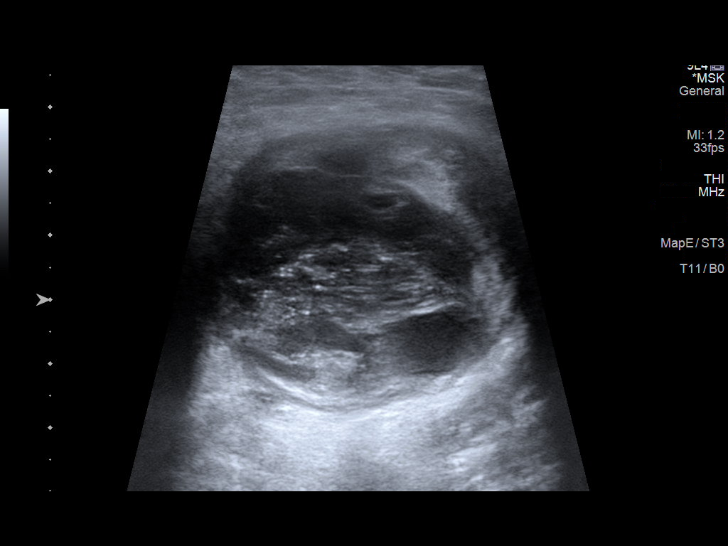
[im 10/13]
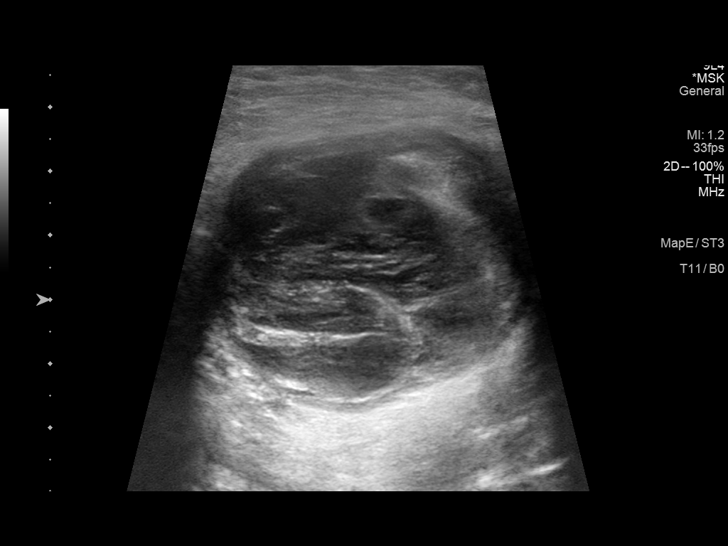
[im 11/13]
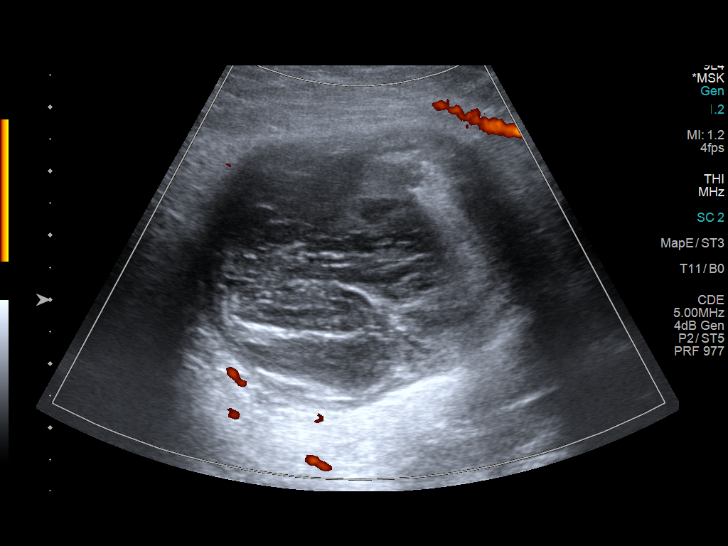
[im 12/13]
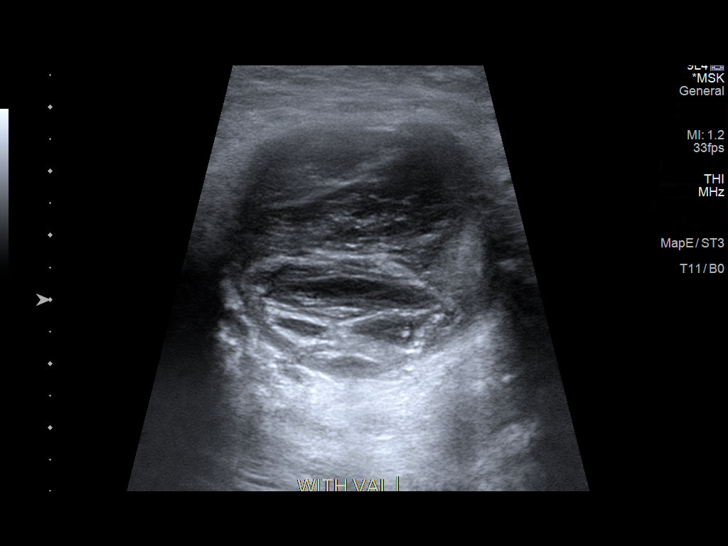
[im 13/13]
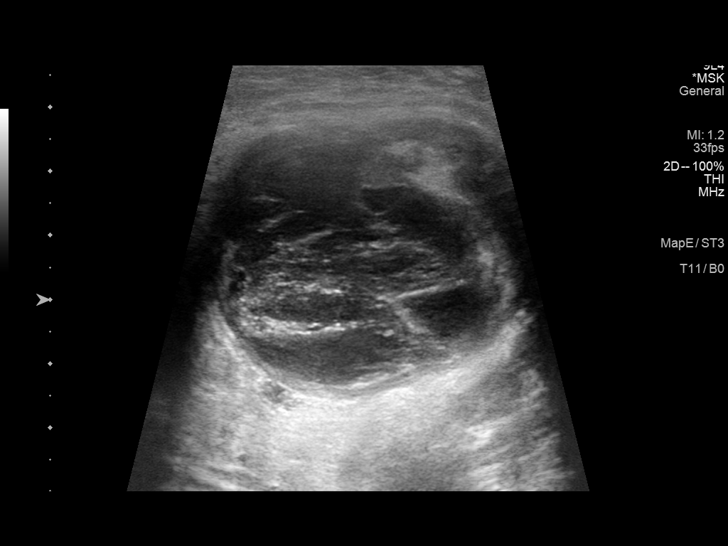

[13 of 13 positions shown; findings below may reference images not displayed]

FINDINGS: In the patient's area of concern there is a 7.1 x 4.4 x 4.6 cm
complex mass in the left inguinal region. This mass demonstrates no
significant internal color Doppler flow. The mass appears to be
septated and is hypoechoic. There are no definite foci of gas or
shadowing calcifications.
IMPRESSION: 7 cm mass in the left inguinal region as detailed above. This may
represent a postoperative seroma or hematoma. An abscess seems less
likely. This mass does not appear to represent a recurrent hernia.

## 2021-03-06 NOTE — Progress Notes (Signed)
Carelink Summary Report / Loop Recorder 

## 2021-03-07 DIAGNOSIS — E7800 Pure hypercholesterolemia, unspecified: Secondary | ICD-10-CM | POA: Diagnosis not present

## 2021-03-07 DIAGNOSIS — J42 Unspecified chronic bronchitis: Secondary | ICD-10-CM | POA: Diagnosis not present

## 2021-03-07 DIAGNOSIS — I10 Essential (primary) hypertension: Secondary | ICD-10-CM | POA: Diagnosis not present

## 2021-03-18 LAB — CUP PACEART REMOTE DEVICE CHECK
Date Time Interrogation Session: 20220705230622
Implantable Pulse Generator Implant Date: 20210322

## 2021-03-19 ENCOUNTER — Ambulatory Visit (INDEPENDENT_AMBULATORY_CARE_PROVIDER_SITE_OTHER): Payer: PPO

## 2021-03-19 DIAGNOSIS — I472 Ventricular tachycardia: Secondary | ICD-10-CM

## 2021-03-19 DIAGNOSIS — R000 Tachycardia, unspecified: Secondary | ICD-10-CM

## 2021-03-27 ENCOUNTER — Telehealth: Payer: Self-pay | Admitting: Interventional Cardiology

## 2021-03-27 NOTE — Telephone Encounter (Signed)
Pt c/o swelling: STAT is pt has developed SOB within 24 hours  If swelling, where is the swelling located? Ankles   How much weight have you gained and in what time span? Does not believe so, does not check weight  Have you gained 3 pounds in a day or 5 pounds in a week? Does not think so  Do you have a log of your daily weights (if so, list)? no  Are you currently taking a fluid pill? Yes, PCP prescribed lasix for swelling  Are you currently SOB? no  Have you traveled recently? no   Patient states he has had swelling in his ankles, but does not believe he has gained weight. He states his PCP wants him to lose 20-25 lbs. He states his PCP has also prescribed him lasix for the swelling. He says his PCP keeps close check on BP and that is stays pretty level. He would like to know if he needs a sooner appt with Dr. Irish Lack or an echo.

## 2021-03-28 NOTE — Telephone Encounter (Signed)
OK to keep same appt if he is responding to the furosemide.   JV   Patient notified.  He reports swelling is off and on and he uses lasix only as needed.  He will follow up with Dr Irish Lack as planned.

## 2021-04-04 DIAGNOSIS — J42 Unspecified chronic bronchitis: Secondary | ICD-10-CM | POA: Diagnosis not present

## 2021-04-04 DIAGNOSIS — I10 Essential (primary) hypertension: Secondary | ICD-10-CM | POA: Diagnosis not present

## 2021-04-04 DIAGNOSIS — E7800 Pure hypercholesterolemia, unspecified: Secondary | ICD-10-CM | POA: Diagnosis not present

## 2021-04-06 DIAGNOSIS — I10 Essential (primary) hypertension: Secondary | ICD-10-CM | POA: Diagnosis not present

## 2021-04-11 NOTE — Progress Notes (Signed)
Carelink Summary Report / Loop Recorder 

## 2021-04-23 ENCOUNTER — Ambulatory Visit (INDEPENDENT_AMBULATORY_CARE_PROVIDER_SITE_OTHER): Payer: PPO

## 2021-04-23 DIAGNOSIS — I472 Ventricular tachycardia: Secondary | ICD-10-CM

## 2021-04-23 DIAGNOSIS — R000 Tachycardia, unspecified: Secondary | ICD-10-CM

## 2021-04-25 LAB — CUP PACEART REMOTE DEVICE CHECK
Date Time Interrogation Session: 20220816071804
Implantable Pulse Generator Implant Date: 20210322

## 2021-05-03 DIAGNOSIS — Z85828 Personal history of other malignant neoplasm of skin: Secondary | ICD-10-CM | POA: Diagnosis not present

## 2021-05-03 DIAGNOSIS — L570 Actinic keratosis: Secondary | ICD-10-CM | POA: Diagnosis not present

## 2021-05-03 DIAGNOSIS — Z85820 Personal history of malignant melanoma of skin: Secondary | ICD-10-CM | POA: Diagnosis not present

## 2021-05-03 DIAGNOSIS — D485 Neoplasm of uncertain behavior of skin: Secondary | ICD-10-CM | POA: Diagnosis not present

## 2021-05-03 DIAGNOSIS — Z86018 Personal history of other benign neoplasm: Secondary | ICD-10-CM | POA: Diagnosis not present

## 2021-05-03 DIAGNOSIS — L821 Other seborrheic keratosis: Secondary | ICD-10-CM | POA: Diagnosis not present

## 2021-05-03 DIAGNOSIS — L280 Lichen simplex chronicus: Secondary | ICD-10-CM | POA: Diagnosis not present

## 2021-05-03 DIAGNOSIS — Z872 Personal history of diseases of the skin and subcutaneous tissue: Secondary | ICD-10-CM | POA: Diagnosis not present

## 2021-05-08 DIAGNOSIS — E291 Testicular hypofunction: Secondary | ICD-10-CM | POA: Diagnosis not present

## 2021-05-08 DIAGNOSIS — I471 Supraventricular tachycardia: Secondary | ICD-10-CM | POA: Diagnosis not present

## 2021-05-08 DIAGNOSIS — M25512 Pain in left shoulder: Secondary | ICD-10-CM | POA: Diagnosis not present

## 2021-05-08 DIAGNOSIS — Z0000 Encounter for general adult medical examination without abnormal findings: Secondary | ICD-10-CM | POA: Diagnosis not present

## 2021-05-08 DIAGNOSIS — Z79899 Other long term (current) drug therapy: Secondary | ICD-10-CM | POA: Diagnosis not present

## 2021-05-08 DIAGNOSIS — I493 Ventricular premature depolarization: Secondary | ICD-10-CM | POA: Diagnosis not present

## 2021-05-08 DIAGNOSIS — I10 Essential (primary) hypertension: Secondary | ICD-10-CM | POA: Diagnosis not present

## 2021-05-08 DIAGNOSIS — Z1389 Encounter for screening for other disorder: Secondary | ICD-10-CM | POA: Diagnosis not present

## 2021-05-08 DIAGNOSIS — E7800 Pure hypercholesterolemia, unspecified: Secondary | ICD-10-CM | POA: Diagnosis not present

## 2021-05-08 DIAGNOSIS — R7303 Prediabetes: Secondary | ICD-10-CM | POA: Diagnosis not present

## 2021-05-08 DIAGNOSIS — G4733 Obstructive sleep apnea (adult) (pediatric): Secondary | ICD-10-CM | POA: Diagnosis not present

## 2021-05-09 DIAGNOSIS — I10 Essential (primary) hypertension: Secondary | ICD-10-CM | POA: Diagnosis not present

## 2021-05-11 DIAGNOSIS — E7800 Pure hypercholesterolemia, unspecified: Secondary | ICD-10-CM | POA: Diagnosis not present

## 2021-05-11 DIAGNOSIS — I10 Essential (primary) hypertension: Secondary | ICD-10-CM | POA: Diagnosis not present

## 2021-05-11 DIAGNOSIS — R7303 Prediabetes: Secondary | ICD-10-CM | POA: Diagnosis not present

## 2021-05-11 DIAGNOSIS — E291 Testicular hypofunction: Secondary | ICD-10-CM | POA: Diagnosis not present

## 2021-05-11 DIAGNOSIS — Z79899 Other long term (current) drug therapy: Secondary | ICD-10-CM | POA: Diagnosis not present

## 2021-05-11 DIAGNOSIS — Z125 Encounter for screening for malignant neoplasm of prostate: Secondary | ICD-10-CM | POA: Diagnosis not present

## 2021-05-11 DIAGNOSIS — Z0000 Encounter for general adult medical examination without abnormal findings: Secondary | ICD-10-CM | POA: Diagnosis not present

## 2021-05-11 NOTE — Progress Notes (Signed)
Carelink Summary Report / Loop Recorder 

## 2021-05-22 NOTE — Progress Notes (Addendum)
Cardiology Office Note   Date:  05/23/2021   ID:  Jerry Ruiz, DOB 12-25-1950, MRN [id number]  PCP:  Lajean Manes, MD    No chief complaint on file.    Wt Readings from Last 3 Encounters:  05/23/21 232 lb 6.4 oz (105.4 kg)  10/30/20 231 lb 6.4 oz (105 kg)  08/24/20 229 lb 12.8 oz (104.2 kg)       History of Present Illness: Jerry Ruiz is a 70 y.o. male  with a hx of Pericardial cyst noted on prior CT scan in 2013, HTN. Echocardiograms have not demonstrated pericardial cyst. Last echo 12/15.    He has had PVCs in the past.   BPs have been difficult to control.  He was seen in the PharmD BP clinic back in 2017, but then went to PMD for a few years after that time.  He had trouble with Hyzaar, losartan: "He notes significant side effects to Hyzaar. He was fatigued and started to notice diffuse arthralgias. His arthralgias have not really improved. However, his fatigue has improved since stopping Hyzaar. (in 2017)"   He has had Diltiazem started recently in 06/2019, and increased to 180 mg daily.    He has had some episodes of PVCs.    Had SVT in 08/2019, referred to EP.   HTN plan was :"High in the hospital.  I think he will need more meds but he prefers to see the effects of CAP.  Continue irbesartan.  If BP remains  High after starting CPAP for OSA, then would increase irbesartan to 300 mg daily."   He changed Dilt to 180 daily and Irbesartan 150 mg daily.     He was started on Amio for his SVT.  Noted some tremors in the morning when he is using his left hand. No tremor at rest.  Only with fine movement.  Amiodarone was planned to be stopped 3 months after hernia surgery in 2021.     Back exercising in a gym. Stopped testosterone.  Started CPAP in late 2020 and BPs improved.    He has had some ringing in the ears.  Worse over the past few years.     He had COVID in 8/21.  He felt fatigue as he started to recover.  He lost taste and smell. He gained some weight.     Dose was decreased to 100 mg daily of Amio in late 2021.  He has had some tremor in late 2021.   EP stopped Amio in 10/2020: "Palpitations Well controlled PVC burden 0.1% SVT without recent recurrent He has severe LA enlargement but no afib history Stop amiodarone today Continue to follow with ILR   2. HTN Elevated Given bifascicular block and bradycardia, I would NOT favor further increases in diltiazem. If heart rates do not improve off of amiodarone I would advise reducing diltiazem to '120mg'$  daily and starting spironolactone for BP management"   BP readings at home have been in the 140-150s, usually 0000000 systolic.  He has a home BP machine.    Statin was recommended.  He does not want to take a statin.  His mother and father both had Parkinson's and he thinks they were related.   He had light fluffy particles on a blood test in the past with Dr. Rayford Halsted.   Denies : Chest pain. Dizziness. Nitroglycerin use. Orthopnea. Palpitations. Paroxysmal nocturnal dyspnea. Shortness of breath. Syncope.     Past Medical History:  Diagnosis Date  Arthritis    Bifascicular block    pvc's   Bladder stone    Headache    History of echocardiogram    a. Echo 12/15: EF 55-60%, no RWMA, mild LAE, trivial MR   History of echocardiogram    Echo 12/17: mod conc LVH, EF 55-60, no RWMA, Gr 1 DD, mild AI, calcified AV leaflets, trivial MR, mod LAE, normal RVSF, mild TR, no pericardial eff   HTN (hypertension)    Pneumonia    PVC's (premature ventricular contractions)    Skin cancer    melanoma bil ears   Sleep apnea    cpap    Past Surgical History:  Procedure Laterality Date   HEMORROIDECTOMY     implantable loop recorder implantation  11/29/2019   Medtronic Reveal Linq model LNQ 22 ([place] G) implanted by Dr Rayann Heman for further evaluation of palpitations and arrhythmia management   INGUINAL HERNIA REPAIR Bilateral 03/28/2020   Procedure: LAPAROSCOPIC BILATERAL INGUINAL HERNIA REPAIR  WITH MESH;  Surgeon: Kinsinger, Arta Bruce, MD;  Location: [facility];  Service: General;  Laterality: Bilateral;   KNEE SURGERY     arthroscopic bil   MOHS SURGERY     right big toe     bone removed   SHOULDER SURGERY     rotator right    TONSILLECTOMY     VASECTOMY     1987     Current Outpatient Medications  Medication Sig Dispense Refill   Cholecalciferol (VITAMIN D) 50 MCG (2000 UT) tablet Take 2,000 Units by mouth every other day.     diltiazem (DILACOR XR) 240 MG 24 hr capsule Take 1 tablet by mouth daily.     FISH OIL-COENZYME Q10 PO Take 1 capsule by mouth daily.      ibuprofen (ADVIL) 800 MG tablet Take 1 tablet (800 mg total) by mouth every 8 (eight) hours as needed. 30 tablet 0   irbesartan (AVAPRO) 300 MG tablet TAKE 1 TABLET BY MOUTH EVERY DAY 90 tablet 3   L-ARGININE PO Take 1,000 mg by mouth daily.     metoprolol succinate (TOPROL-XL) 50 MG 24 hr tablet Take 25 mg by mouth daily. Take with or immediately following a meal.     Misc Natural Products (PROSTATE SUPPORT PO) Take 2 tablets by mouth daily. Prostate Pro otc supplement     Multiple Vitamin (MULTIVITAMIN) capsule Take 1 capsule by mouth daily.     Probiotic Product (PROBIOTIC DAILY PO) Take 1 tablet by mouth at bedtime.      No current facility-administered medications for this visit.    Allergies:   Carvedilol, Cephalexin, Losartan potassium, Amlodipine, and Sulfa antibiotics    Social History:  The patient  reports that he quit smoking about 49 years ago. His smoking use included cigarettes. He has never used smokeless tobacco. He reports current alcohol use. He reports that he does not use drugs.   Family History:  The patient's family history includes Arrhythmia in his mother; Heart disease in his mother; Hypertension in his father and mother; Parkinson's disease in his father.    ROS:  Please see the history of present illness.   Otherwise, review of systems are positive for erectle dysfunction.   All  other systems are reviewed and negative.    PHYSICAL EXAM: VS:  BP 130/72   Pulse 82   Ht '5\' 11"'$  (1.803 m)   Wt 232 lb 6.4 oz (105.4 kg)   SpO2 98%   BMI 32.41 kg/m  ,  BMI Body mass index is 32.41 kg/m. GEN: Well nourished, well developed, in no acute distress HEENT: normal Neck: no JVD, carotid bruits, or masses Cardiac: RRR; no murmurs, rubs, or gallops,; tr ankle edema bilaterally Respiratory:  clear to auscultation bilaterally, normal work of breathing GI: soft, nontender, nondistended, + BS MS: no deformity or atrophy Skin: warm and dry, no rash Neuro:  Strength and sensation are intact Psych: euthymic mood, full affect    Recent Labs: No results found for requested labs within last 8760 hours.   Lipid Panel    Component Value Date/Time   CHOL 153 05/24/2014 0927   TRIG 89 05/24/2014 0927   HDL 30 (L) 05/24/2014 0927   CHOLHDL 5.1 05/24/2014 0927   VLDL 18 05/24/2014 0927   LDLCALC 105 (H) 05/24/2014 0927     Other studies Reviewed: Additional studies/ records that were reviewed today with results demonstrating: prior records and labs reviewed.   ASSESSMENT AND PLAN:  HTN: The current medical regimen is effective;  continue present plan and medications. Start spironolcatone 25 mg daily. BMet in one week.  PharmD HTN clinic.  Low salt diet.  Spironolactone is a reasonable choice given his LVH. SVT: Off of Amio.  Dilt dose should not be increased per EP.  Pericardial cyst: Noted in the past.  Obesity: Healthy diet.  Increase exercise.  PVC: no recent sx.  ED: tried l-arginine in the past.  We discussed Cialis and Viagra.  He is not using nitroglycerin.  His wife, who was present for the visit, does not want him to try either of these medicines.   Current medicines are reviewed at length with the patient today.  The patient concerns regarding his medicines were addressed.  The following changes have been made:  No change  Labs/ tests ordered today include:   No orders of the defined types were placed in this encounter.   Recommend 150 minutes/week of aerobic exercise Low fat, low carb, high fiber diet recommended  Disposition:   FU with me in June 2023   Signed, Larae Grooms, MD  05/23/2021 [facility] [place], [place], [place]  [address] Phone: [phone]; Fax: [phone]

## 2021-05-23 ENCOUNTER — Encounter: Payer: Self-pay | Admitting: Interventional Cardiology

## 2021-05-23 ENCOUNTER — Other Ambulatory Visit: Payer: Self-pay

## 2021-05-23 ENCOUNTER — Ambulatory Visit: Payer: PPO | Admitting: Interventional Cardiology

## 2021-05-23 VITALS — BP 130/72 | HR 82 | Ht 71.0 in | Wt 232.4 lb

## 2021-05-23 DIAGNOSIS — N529 Male erectile dysfunction, unspecified: Secondary | ICD-10-CM

## 2021-05-23 DIAGNOSIS — I493 Ventricular premature depolarization: Secondary | ICD-10-CM | POA: Diagnosis not present

## 2021-05-23 DIAGNOSIS — I10 Essential (primary) hypertension: Secondary | ICD-10-CM | POA: Diagnosis not present

## 2021-05-23 DIAGNOSIS — E669 Obesity, unspecified: Secondary | ICD-10-CM

## 2021-05-23 DIAGNOSIS — I471 Supraventricular tachycardia: Secondary | ICD-10-CM | POA: Diagnosis not present

## 2021-05-23 LAB — CUP PACEART REMOTE DEVICE CHECK
Date Time Interrogation Session: 20220909230406
Implantable Pulse Generator Implant Date: 20210322

## 2021-05-23 MED ORDER — SPIRONOLACTONE 25 MG PO TABS: 25.0000 mg | ORAL_TABLET | Freq: Every day | ORAL | 3 refills | Status: DC

## 2021-05-23 NOTE — Patient Instructions (Signed)
Medication Instructions:  Your physician has recommended you make the following change in your medication: Start Spironolactone 25 mg by mouth daily  *If you need a refill on your cardiac medications before your next appointment, please call your pharmacy*   Lab Work: Your physician recommends that you return for lab work in: one week.  BMP.  This is not fasting  If you have labs (blood work) drawn today and your tests are completely normal, you will receive your results only by: [place] (if you have MyChart) OR A paper copy in the mail If you have any lab test that is abnormal or we need to change your treatment, we will call you to review the results.   Testing/Procedures: none   Follow-Up: At [facility], you and your health needs are our priority.  As part of our continuing mission to provide you with exceptional heart care, we have created designated Provider Care Teams.  These Care Teams include your primary Cardiologist (physician) and Advanced Practice Providers (APPs -  Physician Assistants and Nurse Practitioners) who all work together to provide you with the care you need, when you need it.  We recommend signing up for the patient portal called "MyChart".  Sign up information is provided on this After Visit Summary.  MyChart is used to connect with patients for Virtual Visits (Telemedicine).  Patients are able to view lab/test results, encounter notes, upcoming appointments, etc.  Non-urgent messages can be sent to your provider as well.   To learn more about what you can do with MyChart, go to [email or web address].    Your next appointment:   June 2023  The format for your next appointment:   In Person  Provider:   You may see Larae Grooms, MD or one of the following Advanced Practice Providers on your designated Care Team:   Melina Copa, PA-C Ermalinda Barrios, PA-C   Other Instructions You have been referred to see the pharmacist in the hypertension  clinic in our office.  Please schedule for 3-4 weeks from now

## 2021-05-28 ENCOUNTER — Ambulatory Visit (INDEPENDENT_AMBULATORY_CARE_PROVIDER_SITE_OTHER): Payer: PPO

## 2021-05-28 DIAGNOSIS — I472 Ventricular tachycardia: Secondary | ICD-10-CM

## 2021-05-28 DIAGNOSIS — R000 Tachycardia, unspecified: Secondary | ICD-10-CM

## 2021-05-30 ENCOUNTER — Other Ambulatory Visit: Payer: Self-pay

## 2021-05-30 ENCOUNTER — Other Ambulatory Visit: Payer: PPO | Admitting: *Deleted

## 2021-05-30 DIAGNOSIS — I471 Supraventricular tachycardia: Secondary | ICD-10-CM | POA: Diagnosis not present

## 2021-05-30 DIAGNOSIS — I493 Ventricular premature depolarization: Secondary | ICD-10-CM | POA: Diagnosis not present

## 2021-05-30 DIAGNOSIS — I10 Essential (primary) hypertension: Secondary | ICD-10-CM

## 2021-05-30 LAB — BASIC METABOLIC PANEL
BUN/Creatinine Ratio: 16 (ref 10–24)
BUN: 16 mg/dL (ref 8–27)
CO2: 22 mmol/L (ref 20–29)
Calcium: 9.8 mg/dL (ref 8.6–10.2)
Chloride: 103 mmol/L (ref 96–106)
Creatinine, Ser: 0.98 mg/dL (ref 0.76–1.27)
Glucose: 115 mg/dL — ABNORMAL HIGH (ref 65–99)
Potassium: 4.1 mmol/L (ref 3.5–5.2)
Sodium: 141 mmol/L (ref 134–144)
eGFR: 83 mL/min/{1.73_m2} (ref >59)

## 2021-06-01 ENCOUNTER — Other Ambulatory Visit: Payer: PPO

## 2021-06-01 NOTE — Progress Notes (Signed)
Carelink Summary Report / Loop Recorder 

## 2021-06-07 ENCOUNTER — Telehealth: Payer: Self-pay | Admitting: *Deleted

## 2021-06-07 DIAGNOSIS — E669 Obesity, unspecified: Secondary | ICD-10-CM

## 2021-06-07 DIAGNOSIS — L570 Actinic keratosis: Secondary | ICD-10-CM | POA: Diagnosis not present

## 2021-06-07 DIAGNOSIS — I10 Essential (primary) hypertension: Secondary | ICD-10-CM

## 2021-06-07 NOTE — Telephone Encounter (Signed)
Patient reports Dr Irish Lack talked with him about possibly having CT Scan done.  Patient would like to have done.

## 2021-06-08 DIAGNOSIS — I10 Essential (primary) hypertension: Secondary | ICD-10-CM | POA: Diagnosis not present

## 2021-06-08 DIAGNOSIS — E7800 Pure hypercholesterolemia, unspecified: Secondary | ICD-10-CM | POA: Diagnosis not present

## 2021-06-08 DIAGNOSIS — J42 Unspecified chronic bronchitis: Secondary | ICD-10-CM | POA: Diagnosis not present

## 2021-06-08 NOTE — Telephone Encounter (Signed)
Calcium scoring CT

## 2021-06-08 NOTE — Telephone Encounter (Signed)
Called pt and informed him of MD recommendations.  I explained testing purpose and told him that there is no special instructions for the test.  He had no questions.  Told to call the office with any concerns.

## 2021-06-25 ENCOUNTER — Ambulatory Visit (INDEPENDENT_AMBULATORY_CARE_PROVIDER_SITE_OTHER): Payer: PPO

## 2021-06-25 DIAGNOSIS — I471 Supraventricular tachycardia: Secondary | ICD-10-CM | POA: Diagnosis not present

## 2021-06-26 LAB — CUP PACEART REMOTE DEVICE CHECK
Date Time Interrogation Session: 20221012230842
Implantable Pulse Generator Implant Date: 20210322

## 2021-06-28 NOTE — Progress Notes (Signed)
Patient ID: Jerry Ruiz                 DOB: August 27, 1951                      MRN: [id number]     HPI: Jerry Ruiz is a 70 y.o. male referred by Dr. Irish Lack for HTN. PMH is significant for HTN with past history of PVCs (current burden 0.1% in 10/2020), and SVT in 08/2019 with bifascicular block and hx of bradycardia being managed by EP, severe LA enlargement without Afib OSA requiring CPAP. Most recent ECHO on 07/2019 EF 55-60% with moderately increased LVH.  Patient was last seen by [place] on 9/14 and referred to HTN clinic for follow-up. At that visit, he was started on spironolactone 25 mg daily.  Most recent note from EP seen in [place]'s note recommended not increasing diltiazem.   Patient is also managed by his PCP who has referred him to the pharmacist in his office for management. This pharmacist has been getting his readings automatically when he checks his BP in the morning after taking his medications, but they have not adjusted any of his BP meds.   Today he presents to clinic unsure of why he needs to see another pharmacist. BP in office today is 142/70. He reports SBP typically runs in 140s-150s. Patient is taking medications as prescribed and tolerating them well. He denies headaches, blurred vision. He endorses mild dizziness but this is rare. He notes palpitations occur sometimes frequently for a few month and then not at all for a few months. He notes his wife has been sick over last week with a cough and he is quite concerned. He has been watching his grandkids more and has cut back on going to the gym one less day out of the week and more dietary indiscretion. He also expresses frustration with being unable to reliably reach his other pharmacist or to get his readings.  However, he does not have to pay co-pays to see the other pharmacist. Patient is scheduled for coronary calcium later today.  Current HTN meds: spironolactone 25 mg daily (qam), irbesartan 300 mg daily(qam),  diltiazem 240 mg 24 hr daily (at night), metoprolol succinate 50 mg ER daily (morning) Intolerances: amlodipine 5 mg (08/2017 - low energy), carvedilol 6.25 mg BID (unknown-05/2017), losartan-hctz 100-12.5 mg daily (fatigue/arthralgias) Previously tried: chlorthalidone 12.5 mg daily, lisinopril 40 mg daily, olmesartan 40 mg daily, hctz 25 mg daily BP goal: <130/80  Family History: family history includes Arrhythmia in his mother; Heart disease in his mother; Hypertension in his father and mother; Parkinson's disease in his father.  Social History:  reports that he quit smoking about 49 years ago. His smoking use included cigarettes. He has never used smokeless tobacco. He reports current alcohol use. He reports that he does not use drugs.  Diet: Eats out frequently and eats past the point of being full - although has been eating less while caring for his wife; drinks tea frequently  Exercise: Typically goes to gym 3-5 days per week but has been unable to recently while caring for family; typically walks at a brisk pace 2.5-3.5 mph (able to talk in shorter sentences) for around 20 minutes and does light resistance training - will occasionally take walks at the park  Home BP readings: 137/70 144/80  Wt Readings from Last 3 Encounters:  05/23/21 232 lb 6.4 oz (105.4 kg)  10/30/20 231 lb 6.4 oz (105 kg)  08/24/20 229 lb 12.8 oz (104.2 kg)   BP Readings from Last 3 Encounters:  06/29/21 (!) 142/70  05/23/21 130/72  10/30/20 (!) 158/86   Pulse Readings from Last 3 Encounters:  06/29/21 (!) 56  05/23/21 82  10/30/20 (!) 54    Renal function: CrCl cannot be calculated (Patient's most recent lab result is older than the maximum 21 days allowed.).  Past Medical History:  Diagnosis Date   Arthritis    Bifascicular block    pvc's   Bladder stone    Headache    History of echocardiogram    a. Echo 12/15: EF 55-60%, no RWMA, mild LAE, trivial MR   History of echocardiogram    Echo 12/17:  mod conc LVH, EF 55-60, no RWMA, Gr 1 DD, mild AI, calcified AV leaflets, trivial MR, mod LAE, normal RVSF, mild TR, no pericardial eff   HTN (hypertension)    Pneumonia    PVC's (premature ventricular contractions)    Skin cancer    melanoma bil ears   Sleep apnea    cpap    Current Outpatient Medications on File Prior to Visit  Medication Sig Dispense Refill   Cholecalciferol (VITAMIN D) 50 MCG (2000 UT) tablet Take 2,000 Units by mouth every other day.     diltiazem (DILACOR XR) 240 MG 24 hr capsule Take 1 tablet by mouth daily.     FISH OIL-COENZYME Q10 PO Take 1 capsule by mouth daily.      ibuprofen (ADVIL) 800 MG tablet Take 1 tablet (800 mg total) by mouth every 8 (eight) hours as needed. 30 tablet 0   irbesartan (AVAPRO) 300 MG tablet TAKE 1 TABLET BY MOUTH EVERY DAY 90 tablet 3   L-ARGININE PO Take 1,000 mg by mouth daily.     metoprolol succinate (TOPROL-XL) 50 MG 24 hr tablet Take 25 mg by mouth daily. Take with or immediately following a meal.     Misc Natural Products (PROSTATE SUPPORT PO) Take 2 tablets by mouth daily. Prostate Pro otc supplement     Multiple Vitamin (MULTIVITAMIN) capsule Take 1 capsule by mouth daily.     Probiotic Product (PROBIOTIC DAILY PO) Take 1 tablet by mouth at bedtime.      spironolactone (ALDACTONE) 25 MG tablet Take 1 tablet (25 mg total) by mouth daily. 30 tablet 3   No current facility-administered medications on file prior to visit.    Allergies  Allergen Reactions   Carvedilol Other (See Comments)   Cephalexin Other (See Comments)   Losartan Potassium Other (See Comments)   Amlodipine Other (See Comments)    Low energy    Sulfa Antibiotics Rash, Hives and Itching    Blood pressure (!) 142/70, pulse (!) 56, SpO2 99 %.   Assessment/Plan:  1. Hypertension - Pt BP in office is 142/70 and above goal of <130/80. Patient has a number of life stressors and room for lifestyle modifications, so will focus on diet and exercise changes.  No medication changes today. Continue irbesartan 300 mg daily, diltiazem 240 mg daily, metoprolol succinate 50 mg ER daily, and spironolactone 25 mg daily.  Counseled patient to try to eat meals slower and to stop eating when no longer hungry as well as to cut back on tea to < or = to 2 cups per day.   Encouraged patient to continue regular exercise at 150 minutes / week and to try to walk at a brisk pace for at least 5 days out of the week.  Will call patient in a few weeks to assess blood pressure and lifestyle changes. If BP is still elevated, consider increasing to spironolactone 50 mg daily. Will not increase metoprolol and diltiazem per EP's recommendations and pt's hx of bradycardia and bifascicular block.  Thank you  Cyd Silence  Pharm D. Candidate  [facility], [place].D, BCPS, CPP [place]  [address], [place], [place] [address]  Phone: [phone]; Fax: [phone]

## 2021-06-29 ENCOUNTER — Other Ambulatory Visit: Payer: Self-pay

## 2021-06-29 ENCOUNTER — Ambulatory Visit: Payer: PPO | Admitting: Pharmacist

## 2021-06-29 ENCOUNTER — Ambulatory Visit (INDEPENDENT_AMBULATORY_CARE_PROVIDER_SITE_OTHER)
Admission: RE | Admit: 2021-06-29 | Discharge: 2021-06-29 | Disposition: A | Discharge status: Home / Self Care | Payer: Self-pay | Source: Ambulatory Visit | Attending: Interventional Cardiology | Admitting: Interventional Cardiology

## 2021-06-29 VITALS — BP 142/70 | HR 56

## 2021-06-29 DIAGNOSIS — I10 Essential (primary) hypertension: Secondary | ICD-10-CM | POA: Diagnosis not present

## 2021-06-29 DIAGNOSIS — E669 Obesity, unspecified: Secondary | ICD-10-CM

## 2021-06-29 NOTE — Patient Instructions (Addendum)
It was nice to see you today! Your BP today in office is 142/70. This is above goal of <130/80.    No medication changes today.   Continue irbesartan 300 mg daily, diltiazem 240 mg daily, metoprolol succinate 50 mg ER daily, and spironolactone 25 mg daily.  Continue monitoring your blood pressure at home.   Try to eat meals slower, and try to stop eating when you are no longer hungry. Try to cut back on tea to 2 cups a day. Aim for 5 days of brisk walking a week and to get at least 150 minutes total per week of aerobic activity. Five-minute increments are ok.  Will call you in a few weeks to assess blood pressure and assess lifestyle changes.

## 2021-07-04 NOTE — Progress Notes (Signed)
Carelink Summary Report / Loop Recorder 

## 2021-07-17 ENCOUNTER — Telehealth: Payer: Self-pay | Admitting: Pharmacist

## 2021-07-17 NOTE — Telephone Encounter (Signed)
I called and LVM at Dr. Carlyle Lipa office requesting that patients BP readings be sent to our office. This was upon the request of pt. He is followed by a pharmacist at [facility] office. When he checks his BP at home the readings get sent to them. He is requesting that we request a list of them.

## 2021-07-18 NOTE — Telephone Encounter (Signed)
[facility] returned call asking for fax # to send pt's BP readings to. This has been provided, they will fax over readings.

## 2021-07-24 ENCOUNTER — Encounter: Payer: Self-pay | Admitting: Internal Medicine

## 2021-07-24 NOTE — Telephone Encounter (Signed)
BP readings sent by [facility] office were from Aug and Sept. I called pt. He states he thinks his BP lately has been really good. States its been 543'E/14-84 some 039 systolic. Refuses to believe that his BP could ever be <130/80 and he feel good. States he feels good right now. We discussed increasing spironolactone to 50mg  but patient does not wish to do so.  I called and spoke with Verline Lema [phone]) to get more recent readings Nov 133/75 127/74 141/83 139/80 132/79 126/68 137/76 141/76 129/72  Oct 142/94 147/78 139/89 139/75

## 2021-07-25 LAB — CUP PACEART REMOTE DEVICE CHECK
Date Time Interrogation Session: 20221114230739
Implantable Pulse Generator Implant Date: 20210322

## 2021-07-30 ENCOUNTER — Ambulatory Visit: Payer: PPO | Admitting: Internal Medicine

## 2021-07-30 ENCOUNTER — Ambulatory Visit (INDEPENDENT_AMBULATORY_CARE_PROVIDER_SITE_OTHER): Payer: PPO

## 2021-07-30 DIAGNOSIS — I471 Supraventricular tachycardia: Secondary | ICD-10-CM | POA: Diagnosis not present

## 2021-08-08 NOTE — Progress Notes (Signed)
Carelink Summary Report / Loop Recorder 

## 2021-08-17 ENCOUNTER — Other Ambulatory Visit: Payer: Self-pay

## 2021-08-17 ENCOUNTER — Encounter: Payer: Self-pay | Admitting: Internal Medicine

## 2021-08-17 ENCOUNTER — Ambulatory Visit: Payer: PPO | Admitting: Internal Medicine

## 2021-08-17 VITALS — BP 126/66 | HR 61 | Ht 71.0 in | Wt 233.8 lb

## 2021-08-17 DIAGNOSIS — G4733 Obstructive sleep apnea (adult) (pediatric): Secondary | ICD-10-CM | POA: Diagnosis not present

## 2021-08-17 DIAGNOSIS — R002 Palpitations: Secondary | ICD-10-CM

## 2021-08-17 DIAGNOSIS — I493 Ventricular premature depolarization: Secondary | ICD-10-CM | POA: Diagnosis not present

## 2021-08-17 DIAGNOSIS — I10 Essential (primary) hypertension: Secondary | ICD-10-CM

## 2021-08-17 DIAGNOSIS — I471 Supraventricular tachycardia: Secondary | ICD-10-CM | POA: Diagnosis not present

## 2021-08-17 DIAGNOSIS — Z1212 Encounter for screening for malignant neoplasm of rectum: Secondary | ICD-10-CM | POA: Diagnosis not present

## 2021-08-17 NOTE — Patient Instructions (Addendum)
Medication Instructions:  Your physician recommends that you continue on your current medications as directed. Please refer to the Current Medication list given to you today. *If you need a refill on your cardiac medications before your next appointment, please call your pharmacy*  Lab Work: None. If you have labs (blood work) drawn today and your tests are completely normal, you will receive your results only by: [place] (if you have MyChart) OR A paper copy in the mail If you have any lab test that is abnormal or we need to change your treatment, we will call you to review the results.  Testing/Procedures: None.  Follow-Up: At [facility], you and your health needs are our priority.  As part of our continuing mission to provide you with exceptional heart care, we have created designated Provider Care Teams.  These Care Teams include your primary Cardiologist (physician) and Advanced Practice Providers (APPs -  Physician Assistants and Nurse Practitioners) who all work together to provide you with the care you need, when you need it.  Your physician wants you to follow-up in: 12 months with  Thompson Grayer, MD or one of the following Advanced Practice Providers on your designated Care Team:    Tommye Standard, PA-C    You will receive a reminder letter in the mail two months in advance. If you don't receive a letter, please call our office to schedule the follow-up appointment.  We recommend signing up for the patient portal called "MyChart".  Sign up information is provided on this After Visit Summary.  MyChart is used to connect with patients for Virtual Visits (Telemedicine).  Patients are able to view lab/test results, encounter notes, upcoming appointments, etc.  Non-urgent messages can be sent to your provider as well.   To learn more about what you can do with MyChart, go to [email or web address].    Any Other Special Instructions Will Be Listed Below (If  Applicable).

## 2021-08-17 NOTE — Progress Notes (Signed)
PCP: Lajean Manes, MD Primary Cardiologist: Dr Irish Lack Primary EP: Dr Rayann Heman  Jerry Ruiz is a 70 y.o. male who presents today for routine electrophysiology followup.  Since last being seen in our clinic, the patient reports doing very well.  Today, he denies symptoms of palpitations, chest pain, shortness of breath,  lower extremity edema, dizziness, presyncope, or syncope.  The patient is otherwise without complaint today.   Past Medical History:  Diagnosis Date   Arthritis    Bifascicular block    pvc's   Bladder stone    Headache    History of echocardiogram    a. Echo 12/15: EF 55-60%, no RWMA, mild LAE, trivial MR   History of echocardiogram    Echo 12/17: mod conc LVH, EF 55-60, no RWMA, Gr 1 DD, mild AI, calcified AV leaflets, trivial MR, mod LAE, normal RVSF, mild TR, no pericardial eff   HTN (hypertension)    Pneumonia    PVC's (premature ventricular contractions)    Skin cancer    melanoma bil ears   Sleep apnea    cpap   Past Surgical History:  Procedure Laterality Date   HEMORROIDECTOMY     implantable loop recorder implantation  11/29/2019   Medtronic Reveal Linq model LNQ 22 ([place]) implanted by Dr Rayann Heman for further evaluation of palpitations and arrhythmia management   INGUINAL HERNIA REPAIR Bilateral 03/28/2020   Procedure: LAPAROSCOPIC BILATERAL INGUINAL HERNIA REPAIR WITH MESH;  Surgeon: Kinsinger, Arta Bruce, MD;  Location: [facility];  Service: General;  Laterality: Bilateral;   KNEE SURGERY     arthroscopic bil   MOHS SURGERY     right big toe     bone removed   SHOULDER SURGERY     rotator right    TONSILLECTOMY     VASECTOMY     1987    ROS- all systems are reviewed and negatives except as per HPI above  Current Outpatient Medications  Medication Sig Dispense Refill   Cholecalciferol (VITAMIN D) 50 MCG (2000 UT) tablet Take 2,000 Units by mouth every other day.     diltiazem (DILACOR XR) 240 MG 24 hr capsule Take 1 tablet by mouth  daily.     FISH OIL-COENZYME Q10 PO Take 1 capsule by mouth daily.      ibuprofen (ADVIL) 800 MG tablet Take 1 tablet (800 mg total) by mouth every 8 (eight) hours as needed. 30 tablet 0   irbesartan (AVAPRO) 300 MG tablet TAKE 1 TABLET BY MOUTH EVERY DAY 90 tablet 3   L-ARGININE PO Take 1,000 mg by mouth daily.     metoprolol succinate (TOPROL-XL) 50 MG 24 hr tablet Take 25 mg by mouth daily. Take with or immediately following a meal.     Misc Natural Products (PROSTATE SUPPORT PO) Take 2 tablets by mouth daily. Prostate Pro otc supplement     Multiple Vitamin (MULTIVITAMIN) capsule Take 1 capsule by mouth daily.     Probiotic Product (PROBIOTIC DAILY PO) Take 1 tablet by mouth at bedtime.      spironolactone (ALDACTONE) 25 MG tablet Take 1 tablet (25 mg total) by mouth daily. 30 tablet 3   furosemide (LASIX) 20 MG tablet 1 tablet as needed (Patient not taking: Reported on 08/17/2021)     No current facility-administered medications for this visit.    Physical Exam: Vitals:   08/17/21 1513  BP: 126/66  Pulse: 61  SpO2: 98%  Weight: 233 lb 12.8 oz (106.1 kg)  Height:  5\' 11"  (1.803 m)    GEN- The patient is well appearing, alert and oriented x 3 today.   Head- normocephalic, atraumatic Eyes-  Sclera clear, conjunctiva pink Ears- hearing intact Oropharynx- clear Lungs- Clear to ausculation bilaterally, normal work of breathing Heart- Regular rate and rhythm  GI- soft  Extremities- no clubbing, cyanosis, or edema  Wt Readings from Last 3 Encounters:  08/17/21 233 lb 12.8 oz (106.1 kg)  05/23/21 232 lb 6.4 oz (105.4 kg)  10/30/20 231 lb 6.4 oz (105 kg)    EKG tracing ordered today is personally reviewed and shows sinus, RBBB, LAHB  Assessment and Plan:  Palpitations Well controlled off of amiodarone He had a short episode of atach 6/22 by ILR.  AF burden is 0%.  PVCs 2.5% He has severe LA enlargement but no history of afib  2. HTN Stable No change required today  3.  Obesity Body mass index is 32.61 kg/m. Lifestyle modification advised  4. OSA Uses CPAP  5. ED We discussed options at length, including viagra, cialis, pump, implant, and urology referral He is not ready to make any decisions today  Risks, benefits and potential toxicities for medications prescribed and/or refilled reviewed with patient today.   Return to see EP APP in a year  Thompson Grayer MD, [facility] 08/17/2021 3:33 PM

## 2021-08-30 LAB — CUP PACEART REMOTE DEVICE CHECK
Date Time Interrogation Session: 20221217231128
Implantable Pulse Generator Implant Date: 20210322

## 2021-09-04 ENCOUNTER — Ambulatory Visit (INDEPENDENT_AMBULATORY_CARE_PROVIDER_SITE_OTHER): Payer: PPO

## 2021-09-04 DIAGNOSIS — I10 Essential (primary) hypertension: Secondary | ICD-10-CM | POA: Diagnosis not present

## 2021-09-04 DIAGNOSIS — I493 Ventricular premature depolarization: Secondary | ICD-10-CM | POA: Diagnosis not present

## 2021-09-04 DIAGNOSIS — I471 Supraventricular tachycardia: Secondary | ICD-10-CM

## 2021-09-13 NOTE — Progress Notes (Signed)
Carelink Summary Report / Loop Recorder 

## 2021-09-15 ENCOUNTER — Other Ambulatory Visit: Payer: Self-pay | Admitting: Interventional Cardiology

## 2021-09-17 ENCOUNTER — Telehealth: Payer: Self-pay | Admitting: Interventional Cardiology

## 2021-09-17 NOTE — Telephone Encounter (Signed)
Pt is wanting for Dr. Hassell Done nurse to reach out to him... please advise

## 2021-09-17 NOTE — Telephone Encounter (Signed)
I spoke with patient. He reports BP has been better for the last 4-5 months.  Saw Dr Felipa Eth last month and BP was OK. Patient reports BP has been lower the last 3 weeks.  He is concerned BP is getting too low. Reports last reading was 120's/60-70. I told patient this was a good BP reading.  Patient does not know any other readings and reports BP readings are sent daily to Dr Carlyle Lipa office.  He has reached out to that office and requested readings be sent to Dr Irish Lack for review.

## 2021-09-25 DIAGNOSIS — I10 Essential (primary) hypertension: Secondary | ICD-10-CM | POA: Diagnosis not present

## 2021-09-25 DIAGNOSIS — E7800 Pure hypercholesterolemia, unspecified: Secondary | ICD-10-CM | POA: Diagnosis not present

## 2021-09-25 DIAGNOSIS — J42 Unspecified chronic bronchitis: Secondary | ICD-10-CM | POA: Diagnosis not present

## 2021-09-26 NOTE — Telephone Encounter (Signed)
I reviewed the readings from Dr. Felipa Eth and I think his BP is very well controlled.  I do not think his readings are too low.  Continue current medications.   Jerry Ruiz   I spoke with patient and gave him information from Dr Irish Lack

## 2021-10-08 ENCOUNTER — Ambulatory Visit (INDEPENDENT_AMBULATORY_CARE_PROVIDER_SITE_OTHER): Payer: PPO

## 2021-10-08 DIAGNOSIS — I471 Supraventricular tachycardia: Secondary | ICD-10-CM

## 2021-10-08 LAB — CUP PACEART REMOTE DEVICE CHECK
Date Time Interrogation Session: 20230129231800
Implantable Pulse Generator Implant Date: 20210322

## 2021-10-09 DIAGNOSIS — I10 Essential (primary) hypertension: Secondary | ICD-10-CM | POA: Diagnosis not present

## 2021-10-16 NOTE — Progress Notes (Signed)
Carelink Summary Report / Loop Recorder 

## 2021-10-31 ENCOUNTER — Telehealth: Payer: Self-pay

## 2021-10-31 ENCOUNTER — Telehealth: Payer: Self-pay | Admitting: Internal Medicine

## 2021-10-31 NOTE — Telephone Encounter (Signed)
Patient c/o Palpitations:  High priority if patient c/o lightheadedness, shortness of breath, or chest pain  How long have you had palpitations/irregular HR/ Afib? Are you having the symptoms now?  PVC's mainly at night, always had them- has become worse over the past 2-3 months. Patient states he sent in a transmission this morning. Would like to have reviewed and discuss.  Are you currently experiencing lightheadedness, SOB or CP?  No   Do you have a history of afib (atrial fibrillation) or irregular heart rhythm?  Hx of PVC's  Have you checked your BP or HR? (document readings if available):  No readings, but patient states BP has been better   Are you experiencing any other symptoms?  Fatigue

## 2021-10-31 NOTE — Telephone Encounter (Signed)
Returned call to Pt.  Advised his PVC burden had been increasing over the last few months, but was still relatively low at 6.6%.  He states he feels his PVC's mostly at night and early morning.  He states he drinks 1-2 caffeinated beverages a day.    Discussed the benign nature of his PVC's.  Advised to decrease caffeine intake.  Also suggested he could try taking Toprol at bedtime to help with nighttime palpitations and possibly improve daytime fatigue.  He states he does not want to take any additional medications at this time.  Pt will try taking Toprol at night and decreasing caffeine intake and will call back if he has increasing palpitations.  No further action needed at this time.

## 2021-10-31 NOTE — Telephone Encounter (Signed)
Patient has a Linq2, transmitting nightly. Most recent data as of 10:30 this morning shows PVC burden 6.6% (slight increase compared to previous summary report in January when it was 5.9%)  Symptom episode marked this morning.

## 2021-10-31 NOTE — Telephone Encounter (Signed)
Forwarded call to device team to review and check his transmission. Called pt to inform him that they will be contacting him. Again he states that this has been gong on for "a couple of months" but he has to send readings into his PCP and he is having issue getting 16 clear readings.

## 2021-10-31 NOTE — Telephone Encounter (Signed)
error 

## 2021-11-05 DIAGNOSIS — Z79890 Hormone replacement therapy (postmenopausal): Secondary | ICD-10-CM | POA: Diagnosis not present

## 2021-11-05 DIAGNOSIS — E291 Testicular hypofunction: Secondary | ICD-10-CM | POA: Diagnosis not present

## 2021-11-05 DIAGNOSIS — G4733 Obstructive sleep apnea (adult) (pediatric): Secondary | ICD-10-CM | POA: Diagnosis not present

## 2021-11-05 DIAGNOSIS — R5383 Other fatigue: Secondary | ICD-10-CM | POA: Diagnosis not present

## 2021-11-12 ENCOUNTER — Ambulatory Visit (INDEPENDENT_AMBULATORY_CARE_PROVIDER_SITE_OTHER): Payer: PPO

## 2021-11-12 DIAGNOSIS — E291 Testicular hypofunction: Secondary | ICD-10-CM | POA: Diagnosis not present

## 2021-11-12 DIAGNOSIS — F32A Depression, unspecified: Secondary | ICD-10-CM | POA: Diagnosis not present

## 2021-11-12 DIAGNOSIS — R454 Irritability and anger: Secondary | ICD-10-CM | POA: Diagnosis not present

## 2021-11-12 DIAGNOSIS — I10 Essential (primary) hypertension: Secondary | ICD-10-CM | POA: Diagnosis not present

## 2021-11-12 DIAGNOSIS — G479 Sleep disorder, unspecified: Secondary | ICD-10-CM | POA: Diagnosis not present

## 2021-11-12 DIAGNOSIS — M2550 Pain in unspecified joint: Secondary | ICD-10-CM | POA: Diagnosis not present

## 2021-11-12 DIAGNOSIS — R5383 Other fatigue: Secondary | ICD-10-CM | POA: Diagnosis not present

## 2021-11-12 DIAGNOSIS — I471 Supraventricular tachycardia: Secondary | ICD-10-CM | POA: Diagnosis not present

## 2021-11-12 DIAGNOSIS — R6882 Decreased libido: Secondary | ICD-10-CM | POA: Diagnosis not present

## 2021-11-12 DIAGNOSIS — I493 Ventricular premature depolarization: Secondary | ICD-10-CM | POA: Diagnosis not present

## 2021-11-12 LAB — CUP PACEART REMOTE DEVICE CHECK
Date Time Interrogation Session: 20230303230833
Implantable Pulse Generator Implant Date: 20210322

## 2021-11-16 DIAGNOSIS — E291 Testicular hypofunction: Secondary | ICD-10-CM | POA: Diagnosis not present

## 2021-11-22 NOTE — Progress Notes (Signed)
Carelink Summary Report / Loop Recorder 

## 2021-11-23 DIAGNOSIS — E291 Testicular hypofunction: Secondary | ICD-10-CM | POA: Diagnosis not present

## 2021-12-03 DIAGNOSIS — E7800 Pure hypercholesterolemia, unspecified: Secondary | ICD-10-CM | POA: Diagnosis not present

## 2021-12-03 DIAGNOSIS — I10 Essential (primary) hypertension: Secondary | ICD-10-CM | POA: Diagnosis not present

## 2021-12-10 DIAGNOSIS — I471 Supraventricular tachycardia: Secondary | ICD-10-CM | POA: Diagnosis not present

## 2021-12-10 DIAGNOSIS — E291 Testicular hypofunction: Secondary | ICD-10-CM | POA: Diagnosis not present

## 2021-12-10 DIAGNOSIS — I10 Essential (primary) hypertension: Secondary | ICD-10-CM | POA: Diagnosis not present

## 2021-12-14 DIAGNOSIS — R5383 Other fatigue: Secondary | ICD-10-CM | POA: Diagnosis not present

## 2021-12-14 DIAGNOSIS — E291 Testicular hypofunction: Secondary | ICD-10-CM | POA: Diagnosis not present

## 2021-12-14 DIAGNOSIS — Z79890 Hormone replacement therapy (postmenopausal): Secondary | ICD-10-CM | POA: Diagnosis not present

## 2021-12-17 ENCOUNTER — Ambulatory Visit (INDEPENDENT_AMBULATORY_CARE_PROVIDER_SITE_OTHER): Payer: PPO

## 2021-12-17 DIAGNOSIS — I471 Supraventricular tachycardia: Secondary | ICD-10-CM | POA: Diagnosis not present

## 2021-12-18 DIAGNOSIS — Z6832 Body mass index (BMI) 32.0-32.9, adult: Secondary | ICD-10-CM | POA: Diagnosis not present

## 2021-12-18 DIAGNOSIS — G479 Sleep disorder, unspecified: Secondary | ICD-10-CM | POA: Diagnosis not present

## 2021-12-18 DIAGNOSIS — E291 Testicular hypofunction: Secondary | ICD-10-CM | POA: Diagnosis not present

## 2021-12-18 DIAGNOSIS — I10 Essential (primary) hypertension: Secondary | ICD-10-CM | POA: Diagnosis not present

## 2021-12-18 DIAGNOSIS — R39198 Other difficulties with micturition: Secondary | ICD-10-CM | POA: Diagnosis not present

## 2021-12-18 DIAGNOSIS — R6882 Decreased libido: Secondary | ICD-10-CM | POA: Diagnosis not present

## 2021-12-18 LAB — CUP PACEART REMOTE DEVICE CHECK
Date Time Interrogation Session: 20230411114842
Implantable Pulse Generator Implant Date: 20210322

## 2022-01-01 NOTE — Progress Notes (Signed)
Carelink Summary Report / Loop Recorder 

## 2022-01-14 DIAGNOSIS — Z79890 Hormone replacement therapy (postmenopausal): Secondary | ICD-10-CM | POA: Diagnosis not present

## 2022-01-14 DIAGNOSIS — E291 Testicular hypofunction: Secondary | ICD-10-CM | POA: Diagnosis not present

## 2022-01-14 DIAGNOSIS — R5383 Other fatigue: Secondary | ICD-10-CM | POA: Diagnosis not present

## 2022-01-16 DIAGNOSIS — Z6831 Body mass index (BMI) 31.0-31.9, adult: Secondary | ICD-10-CM | POA: Diagnosis not present

## 2022-01-16 DIAGNOSIS — E291 Testicular hypofunction: Secondary | ICD-10-CM | POA: Diagnosis not present

## 2022-01-16 DIAGNOSIS — R5383 Other fatigue: Secondary | ICD-10-CM | POA: Diagnosis not present

## 2022-01-16 DIAGNOSIS — G479 Sleep disorder, unspecified: Secondary | ICD-10-CM | POA: Diagnosis not present

## 2022-01-21 ENCOUNTER — Ambulatory Visit (INDEPENDENT_AMBULATORY_CARE_PROVIDER_SITE_OTHER): Payer: PPO

## 2022-01-21 DIAGNOSIS — I471 Supraventricular tachycardia: Secondary | ICD-10-CM | POA: Diagnosis not present

## 2022-01-21 LAB — CUP PACEART REMOTE DEVICE CHECK
Date Time Interrogation Session: 20230510231234
Implantable Pulse Generator Implant Date: 20210322

## 2022-01-25 ENCOUNTER — Other Ambulatory Visit: Payer: Self-pay | Admitting: Interventional Cardiology

## 2022-02-06 DIAGNOSIS — H5203 Hypermetropia, bilateral: Secondary | ICD-10-CM | POA: Diagnosis not present

## 2022-02-06 DIAGNOSIS — H2513 Age-related nuclear cataract, bilateral: Secondary | ICD-10-CM | POA: Diagnosis not present

## 2022-02-06 DIAGNOSIS — H35033 Hypertensive retinopathy, bilateral: Secondary | ICD-10-CM | POA: Diagnosis not present

## 2022-02-08 NOTE — Progress Notes (Signed)
Carelink Summary Report / Loop Recorder 

## 2022-02-25 ENCOUNTER — Ambulatory Visit (INDEPENDENT_AMBULATORY_CARE_PROVIDER_SITE_OTHER): Payer: PPO

## 2022-02-25 DIAGNOSIS — I471 Supraventricular tachycardia: Secondary | ICD-10-CM | POA: Diagnosis not present

## 2022-02-27 LAB — CUP PACEART REMOTE DEVICE CHECK
Date Time Interrogation Session: 20230612230833
Implantable Pulse Generator Implant Date: 20210322

## 2022-03-04 DIAGNOSIS — E7800 Pure hypercholesterolemia, unspecified: Secondary | ICD-10-CM | POA: Diagnosis not present

## 2022-03-04 DIAGNOSIS — I10 Essential (primary) hypertension: Secondary | ICD-10-CM | POA: Diagnosis not present

## 2022-03-19 NOTE — Progress Notes (Signed)
Carelink Summary Report / Loop Recorder 

## 2022-04-01 ENCOUNTER — Ambulatory Visit (INDEPENDENT_AMBULATORY_CARE_PROVIDER_SITE_OTHER): Payer: PPO

## 2022-04-01 DIAGNOSIS — R002 Palpitations: Secondary | ICD-10-CM

## 2022-04-01 LAB — CUP PACEART REMOTE DEVICE CHECK
Date Time Interrogation Session: 20230715230806
Implantable Pulse Generator Implant Date: 20210322

## 2022-04-22 DIAGNOSIS — R5383 Other fatigue: Secondary | ICD-10-CM | POA: Diagnosis not present

## 2022-04-22 DIAGNOSIS — Z79890 Hormone replacement therapy (postmenopausal): Secondary | ICD-10-CM | POA: Diagnosis not present

## 2022-04-22 DIAGNOSIS — E291 Testicular hypofunction: Secondary | ICD-10-CM | POA: Diagnosis not present

## 2022-04-29 DIAGNOSIS — E669 Obesity, unspecified: Secondary | ICD-10-CM | POA: Diagnosis not present

## 2022-04-29 DIAGNOSIS — G479 Sleep disorder, unspecified: Secondary | ICD-10-CM | POA: Diagnosis not present

## 2022-04-29 DIAGNOSIS — I10 Essential (primary) hypertension: Secondary | ICD-10-CM | POA: Diagnosis not present

## 2022-04-29 DIAGNOSIS — M2550 Pain in unspecified joint: Secondary | ICD-10-CM | POA: Diagnosis not present

## 2022-04-29 DIAGNOSIS — Z6831 Body mass index (BMI) 31.0-31.9, adult: Secondary | ICD-10-CM | POA: Diagnosis not present

## 2022-04-29 DIAGNOSIS — E291 Testicular hypofunction: Secondary | ICD-10-CM | POA: Diagnosis not present

## 2022-04-29 DIAGNOSIS — G4733 Obstructive sleep apnea (adult) (pediatric): Secondary | ICD-10-CM | POA: Diagnosis not present

## 2022-05-03 NOTE — Progress Notes (Signed)
Carelink Summary Report / Loop Recorder 

## 2022-05-05 LAB — CUP PACEART REMOTE DEVICE CHECK
Date Time Interrogation Session: 20230817231001
Implantable Pulse Generator Implant Date: 20210322

## 2022-05-06 ENCOUNTER — Ambulatory Visit (INDEPENDENT_AMBULATORY_CARE_PROVIDER_SITE_OTHER): Payer: PPO

## 2022-05-06 DIAGNOSIS — L821 Other seborrheic keratosis: Secondary | ICD-10-CM | POA: Diagnosis not present

## 2022-05-06 DIAGNOSIS — Z85828 Personal history of other malignant neoplasm of skin: Secondary | ICD-10-CM | POA: Diagnosis not present

## 2022-05-06 DIAGNOSIS — Z872 Personal history of diseases of the skin and subcutaneous tissue: Secondary | ICD-10-CM | POA: Diagnosis not present

## 2022-05-06 DIAGNOSIS — D485 Neoplasm of uncertain behavior of skin: Secondary | ICD-10-CM | POA: Diagnosis not present

## 2022-05-06 DIAGNOSIS — L570 Actinic keratosis: Secondary | ICD-10-CM | POA: Diagnosis not present

## 2022-05-06 DIAGNOSIS — D2361 Other benign neoplasm of skin of right upper limb, including shoulder: Secondary | ICD-10-CM | POA: Diagnosis not present

## 2022-05-06 DIAGNOSIS — L578 Other skin changes due to chronic exposure to nonionizing radiation: Secondary | ICD-10-CM | POA: Diagnosis not present

## 2022-05-06 DIAGNOSIS — C44311 Basal cell carcinoma of skin of nose: Secondary | ICD-10-CM | POA: Diagnosis not present

## 2022-05-06 DIAGNOSIS — L298 Other pruritus: Secondary | ICD-10-CM | POA: Diagnosis not present

## 2022-05-06 DIAGNOSIS — Z86018 Personal history of other benign neoplasm: Secondary | ICD-10-CM | POA: Diagnosis not present

## 2022-05-06 DIAGNOSIS — R002 Palpitations: Secondary | ICD-10-CM

## 2022-05-06 DIAGNOSIS — Z85820 Personal history of malignant melanoma of skin: Secondary | ICD-10-CM | POA: Diagnosis not present

## 2022-05-15 ENCOUNTER — Telehealth: Payer: Self-pay | Admitting: Interventional Cardiology

## 2022-05-15 DIAGNOSIS — R0600 Dyspnea, unspecified: Secondary | ICD-10-CM

## 2022-05-15 NOTE — Telephone Encounter (Signed)
I spoke with patient. He called to schedule his yearly follow up and asked about echo.  Appointment with Dr Irish Lack has been scheduled for June 27, 2022. Patient is asking if Dr Irish Lack would order an echo to be done prior to this appointment.  Patient reports while in [place] and [place] earlier this year he had a lot of shortness of breath.  Had to rest while he was walking.  Since being home he is not having much shortness of breath but has not been very active. Will check with Dr Irish Lack to see if OK to order echo

## 2022-05-15 NOTE — Telephone Encounter (Signed)
Patient would like to have an echo done prior to his next office visit. Patient states while he was on vacation he was out of breath, he is not sure if it was the elevation as he was a [facility]. It only happened when he was doing something.

## 2022-05-17 NOTE — Telephone Encounter (Signed)
OK to order echo   JV   Order placed.

## 2022-05-24 DIAGNOSIS — Z79899 Other long term (current) drug therapy: Secondary | ICD-10-CM | POA: Diagnosis not present

## 2022-05-24 DIAGNOSIS — R251 Tremor, unspecified: Secondary | ICD-10-CM | POA: Diagnosis not present

## 2022-05-24 DIAGNOSIS — R350 Frequency of micturition: Secondary | ICD-10-CM | POA: Diagnosis not present

## 2022-05-24 DIAGNOSIS — Z0000 Encounter for general adult medical examination without abnormal findings: Secondary | ICD-10-CM | POA: Diagnosis not present

## 2022-05-24 DIAGNOSIS — I493 Ventricular premature depolarization: Secondary | ICD-10-CM | POA: Diagnosis not present

## 2022-05-24 DIAGNOSIS — E7800 Pure hypercholesterolemia, unspecified: Secondary | ICD-10-CM | POA: Diagnosis not present

## 2022-05-24 DIAGNOSIS — G4733 Obstructive sleep apnea (adult) (pediatric): Secondary | ICD-10-CM | POA: Diagnosis not present

## 2022-05-24 DIAGNOSIS — I10 Essential (primary) hypertension: Secondary | ICD-10-CM | POA: Diagnosis not present

## 2022-05-24 DIAGNOSIS — I471 Supraventricular tachycardia: Secondary | ICD-10-CM | POA: Diagnosis not present

## 2022-05-24 DIAGNOSIS — Z1331 Encounter for screening for depression: Secondary | ICD-10-CM | POA: Diagnosis not present

## 2022-05-24 DIAGNOSIS — E291 Testicular hypofunction: Secondary | ICD-10-CM | POA: Diagnosis not present

## 2022-05-24 DIAGNOSIS — I700 Atherosclerosis of aorta: Secondary | ICD-10-CM | POA: Diagnosis not present

## 2022-05-29 NOTE — Progress Notes (Signed)
Carelink Summary Report / Loop Recorder 

## 2022-05-31 ENCOUNTER — Ambulatory Visit (HOSPITAL_COMMUNITY): Payer: PPO | Attending: Interventional Cardiology

## 2022-05-31 DIAGNOSIS — R0600 Dyspnea, unspecified: Secondary | ICD-10-CM | POA: Diagnosis not present

## 2022-05-31 DIAGNOSIS — Z0000 Encounter for general adult medical examination without abnormal findings: Secondary | ICD-10-CM | POA: Diagnosis not present

## 2022-06-04 LAB — ECHOCARDIOGRAM COMPLETE
Area-P 1/2: 4.06 cm2
P 1/2 time: 703 msec
S' Lateral: 3 cm

## 2022-06-06 DIAGNOSIS — C4491 Basal cell carcinoma of skin, unspecified: Secondary | ICD-10-CM | POA: Diagnosis not present

## 2022-06-06 DIAGNOSIS — L988 Other specified disorders of the skin and subcutaneous tissue: Secondary | ICD-10-CM | POA: Diagnosis not present

## 2022-06-10 ENCOUNTER — Ambulatory Visit (INDEPENDENT_AMBULATORY_CARE_PROVIDER_SITE_OTHER): Payer: PPO

## 2022-06-10 DIAGNOSIS — R002 Palpitations: Secondary | ICD-10-CM | POA: Diagnosis not present

## 2022-06-11 LAB — CUP PACEART REMOTE DEVICE CHECK
Date Time Interrogation Session: 20231001231330
Implantable Pulse Generator Implant Date: 20210322

## 2022-06-20 DIAGNOSIS — R931 Abnormal findings on diagnostic imaging of heart and coronary circulation: Secondary | ICD-10-CM | POA: Diagnosis not present

## 2022-06-25 NOTE — Progress Notes (Signed)
Carelink Summary Report / Loop Recorder 

## 2022-06-26 NOTE — Progress Notes (Unsigned)
Cardiology Office Note   Date:  06/27/2022   ID:  Jerry Ruiz, DOB Jun 17, 1951, MRN [id number]  PCP:  Lajean Manes, MD    No chief complaint on file.  HTN  Wt Readings from Last 3 Encounters:  06/27/22 231 lb (104.8 kg)  08/17/21 233 lb 12.8 oz (106.1 kg)  05/23/21 232 lb 6.4 oz (105.4 kg)       History of Present Illness: Jerry Ruiz is a 71 y.o. male   with a hx of Pericardial cyst noted on prior CT scan in 2013, HTN. Echocardiograms have not demonstrated pericardial cyst. Last echo 12/15.    He has had PVCs in the past.   BPs have been difficult to control.  He was seen in the PharmD BP clinic back in 2017, but then went to PMD for a few years after that time.  He had trouble with Hyzaar, losartan: "He notes significant side effects to Hyzaar. He was fatigued and started to notice diffuse arthralgias. His arthralgias have not really improved. However, his fatigue has improved since stopping Hyzaar. (in 2017)"   He has had Diltiazem started recently in 06/2019, and increased to 180 mg daily.    He has had some episodes of PVCs.    Had SVT in 08/2019, referred to EP.   HTN plan was :"High in the hospital.  I think he will need more meds but he prefers to see the effects of CAP.  Continue irbesartan.  If BP remains  High after starting CPAP for OSA, then would increase irbesartan to 300 mg daily."   He changed Dilt to 180 daily and Irbesartan 150 mg daily.     He was started on Amio for his SVT.  Noted some tremors in the morning when he is using his left hand. No tremor at rest.  Only with fine movement.  Amiodarone was planned to be stopped 3 months after hernia surgery in 2021.     Back exercising in a gym. Stopped testosterone.  Started CPAP in late 2020 and BPs improved.    He has had some ringing in the ears.  Worse over the past few years.     He had COVID in 8/21.  He felt fatigue as he started to recover.  He lost taste and smell. He gained some weight.     Dose was decreased to 100 mg daily of Amio in late 2021.  He has had some tremor in late 2021.    EP stopped Amio in 10/2020: "Palpitations Well controlled PVC burden 0.1% SVT without recent recurrent He has severe LA enlargement but no afib history Stop amiodarone today Continue to follow with ILR   2. HTN Elevated Given bifascicular block and bradycardia, I would NOT favor further increases in diltiazem. If heart rates do not improve off of amiodarone I would advise reducing diltiazem to '120mg'$  daily and starting spironolactone for BP management"  Statin was recommended.  He does not want to take a statin.  His mother and father both had Parkinson's and he thinks they were related.    He had light fluffy particles on a blood test in the past with Dr. Rayford Halsted.   In 2022, we discussed Cialis and Viagra in the past but his wife did not want him to try.  Had some DOE when walking in [place] and [place].    Had basal cell cancer surgery in 8/23.  COuld not use CPAP.    "Left  ventricular ejection fraction, by estimation, is 60 to 65%. The  left ventricle has normal function. The left ventricle has no regional  wall motion abnormalities. There is moderate concentric left ventricular  hypertrophy. Left ventricular  diastolic parameters were normal.   2. Right ventricular systolic function is normal. The right ventricular  size is normal.   3. Left atrial size was mild to moderately dilated.   4. The mitral valve is normal in structure. Trivial mitral valve  regurgitation. No evidence of mitral stenosis.   5. The aortic valve is tricuspid. Aortic valve regurgitation is mild. No  aortic stenosis is present.   6. The inferior vena cava is normal in size with greater than 50%  respiratory variability, suggesting right atrial pressure of 3 mmHg. "  Balance is off.  No falls.   Past Medical History:  Diagnosis Date   Arthritis    Bifascicular block    pvc's   Bladder stone     Headache    History of echocardiogram    a. Echo 12/15: EF 55-60%, no RWMA, mild LAE, trivial MR   History of echocardiogram    Echo 12/17: mod conc LVH, EF 55-60, no RWMA, Gr 1 DD, mild AI, calcified AV leaflets, trivial MR, mod LAE, normal RVSF, mild TR, no pericardial eff   HTN (hypertension)    Pneumonia    PVC's (premature ventricular contractions)    Skin cancer    melanoma bil ears   Sleep apnea    cpap    Past Surgical History:  Procedure Laterality Date   HEMORROIDECTOMY     implantable loop recorder implantation  11/29/2019   Medtronic Reveal Linq model LNQ 22 ([place]) implanted by Dr Rayann Heman for further evaluation of palpitations and arrhythmia management   INGUINAL HERNIA REPAIR Bilateral 03/28/2020   Procedure: LAPAROSCOPIC BILATERAL INGUINAL HERNIA REPAIR WITH MESH;  Surgeon: Kinsinger, Arta Bruce, MD;  Location: [facility];  Service: General;  Laterality: Bilateral;   KNEE SURGERY     arthroscopic bil   MOHS SURGERY     right big toe     bone removed   SHOULDER SURGERY     rotator right    TONSILLECTOMY     VASECTOMY     1987     Current Outpatient Medications  Medication Sig Dispense Refill   Cholecalciferol (VITAMIN D) 50 MCG (2000 UT) tablet Take 2,000 Units by mouth every other day.     diltiazem (DILACOR XR) 240 MG 24 hr capsule Take 1 tablet by mouth daily.     FISH OIL-COENZYME Q10 PO Take 1 capsule by mouth daily.      furosemide (LASIX) 20 MG tablet      ibuprofen (ADVIL) 800 MG tablet Take 1 tablet (800 mg total) by mouth every 8 (eight) hours as needed. 30 tablet 0   irbesartan (AVAPRO) 300 MG tablet TAKE 1 TABLET BY MOUTH EVERY DAY 90 tablet 1   L-ARGININE PO Take 1,000 mg by mouth daily.     metoprolol succinate (TOPROL-XL) 50 MG 24 hr tablet Take 25 mg by mouth daily. Take with or immediately following a meal.     Misc Natural Products (PROSTATE SUPPORT PO) Take 2 tablets by mouth daily. Prostate Pro otc supplement     Multiple Vitamin  (MULTIVITAMIN) capsule Take 1 capsule by mouth daily.     Probiotic Product (PROBIOTIC DAILY PO) Take 1 tablet by mouth at bedtime.      spironolactone (ALDACTONE) 25 MG tablet  TAKE 1 TABLET (25 MG TOTAL) BY MOUTH DAILY. 90 tablet 3   No current facility-administered medications for this visit.    Allergies:   Carvedilol, Cephalexin, Losartan potassium, Amlodipine, and Sulfa antibiotics    Social History:  The patient  reports that he quit smoking about 50 years ago. His smoking use included cigarettes. He has never used smokeless tobacco. He reports current alcohol use. He reports that he does not use drugs.   Family History:  The patient's family history includes Arrhythmia in his mother; Heart disease in his mother; Hypertension in his father and mother; Parkinson's disease in his father.    ROS:  Please see the history of present illness.   Otherwise, review of systems are positive for some numbness in the feet.   All other systems are reviewed and negative.    PHYSICAL EXAM: VS:  BP (!) 164/76   Pulse 100   Ht '5\' 11"'$  (1.803 m)   Wt 231 lb (104.8 kg)   SpO2 99%   BMI 32.22 kg/m  , BMI Body mass index is 32.22 kg/m. GEN: Well nourished, well developed, in no acute distress HEENT: normal Neck: no JVD, carotid bruits, or masses Cardiac: RRR; no murmurs, rubs, or gallops,no edema  Respiratory:  clear to auscultation bilaterally, normal work of breathing GI: soft, nontender, nondistended, + BS MS: no deformity or atrophy Skin: warm and dry, no rash Neuro:  Strength and sensation are intact Psych: euthymic mood, full affect   EKG:   The ekg ordered today demonstrates NSR, increased HR, RBBB, LAFB, no ST changes   Recent Labs: No results found for requested labs within last 365 days.   Lipid Panel    Component Value Date/Time   CHOL 153 05/24/2014 0927   TRIG 89 05/24/2014 0927   HDL 30 (L) 05/24/2014 0927   CHOLHDL 5.1 05/24/2014 0927   VLDL 18 05/24/2014 0927    LDLCALC 105 (H) 05/24/2014 0927     Other studies Reviewed: Additional studies/ records that were reviewed today with results demonstrating: LDL 92 in September 2023, HDL 26, triglycerides 234.  Hemoglobin A1c 6.2   ASSESSMENT AND PLAN:  Hypertension: Not checking at home.  Check BP at home.  He has a machine at home that he is not using.  Blood pressure improved significantly with addition of spironolactone. Coronary artery calcification: He does not want to take cholesterol lowering medication.  He thinks the statins caused Parkinsons some other relatives.  Will refer to Pharm.D. lipid clinic to discuss other options.  Increase shortness of breath of late.  We will check PET/CT stress test to look for ischemia. SVT: No palpitations.  Had some slow HR in the 50s in the past.  Heart rate is higher today.  Check blood pressure and heart rates at home and send readings through MyChart.  Pericardial cyst: No chest pain.  Decreased in size on most recent CT in 2022. Obesity: We spoke about whole food, plant-based diet.  Increase exercise to target noted below.  Now in the prediabetic category.  Increase fiber intake. PVC: no sx currently.  ED: Tried L-arginine in the past.  We discussed Cialis and Viagra in the past but his wife did not want him to try.     Current medicines are reviewed at length with the patient today.  The patient concerns regarding his medicines were addressed.  The following changes have been made:  No change  Labs/ tests ordered today include: Cardiac PET/CT to evaluate  for ischemia given shortness of breath No orders of the defined types were placed in this encounter.   Recommend 150 minutes/week of aerobic exercise Low fat, low carb, high fiber diet recommended  Disposition:   FU based on PET/CT    Signed, Larae Grooms, MD  06/27/2022 9:21 AM    [facility] [place], [place], [place]  [address] Phone: [phone]; Fax: [phone]

## 2022-06-27 ENCOUNTER — Ambulatory Visit: Payer: PPO | Attending: Interventional Cardiology | Admitting: Interventional Cardiology

## 2022-06-27 ENCOUNTER — Encounter: Payer: Self-pay | Admitting: Interventional Cardiology

## 2022-06-27 VITALS — BP 164/76 | HR 100 | Ht 71.0 in | Wt 231.0 lb

## 2022-06-27 DIAGNOSIS — I493 Ventricular premature depolarization: Secondary | ICD-10-CM

## 2022-06-27 DIAGNOSIS — E669 Obesity, unspecified: Secondary | ICD-10-CM | POA: Diagnosis not present

## 2022-06-27 DIAGNOSIS — I2584 Coronary atherosclerosis due to calcified coronary lesion: Secondary | ICD-10-CM

## 2022-06-27 DIAGNOSIS — I2510 Atherosclerotic heart disease of native coronary artery without angina pectoris: Secondary | ICD-10-CM

## 2022-06-27 DIAGNOSIS — I10 Essential (primary) hypertension: Secondary | ICD-10-CM

## 2022-06-27 DIAGNOSIS — G4733 Obstructive sleep apnea (adult) (pediatric): Secondary | ICD-10-CM

## 2022-06-27 DIAGNOSIS — R0609 Other forms of dyspnea: Secondary | ICD-10-CM

## 2022-06-27 DIAGNOSIS — I471 Supraventricular tachycardia, unspecified: Secondary | ICD-10-CM | POA: Diagnosis not present

## 2022-06-27 MED ORDER — IRBESARTAN 300 MG PO TABS: 300.0000 mg | ORAL_TABLET | Freq: Every day | ORAL | 3 refills | Status: DC

## 2022-06-27 MED ORDER — SPIRONOLACTONE 25 MG PO TABS: 25.0000 mg | ORAL_TABLET | Freq: Every day | ORAL | 3 refills | Status: DC

## 2022-06-27 NOTE — Patient Instructions (Addendum)
Medication Instructions:  Your physician recommends that you continue on your current medications as directed. Please refer to the Current Medication list given to you today.  *If you need a refill on your cardiac medications before your next appointment, please call your pharmacy*   Lab Work: none If you have labs (blood work) drawn today and your tests are completely normal, you will receive your results only by: [facility] (if you have MyChart) OR A paper copy in the mail If you have any lab test that is abnormal or we need to change your treatment, we will call you to review the results.   Testing/Procedures: Dr Irish Lack recommends you have a Cardiac PET Scan   Follow-Up: At [facility], you and your health needs are our priority.  As part of our continuing mission to provide you with exceptional heart care, we have created designated Provider Care Teams.  These Care Teams include your primary Cardiologist (physician) and Advanced Practice Providers (APPs -  Physician Assistants and Nurse Practitioners) who all work together to provide you with the care you need, when you need it.  We recommend signing up for the patient portal called "MyChart".  Sign up information is provided on this After Visit Summary.  MyChart is used to connect with patients for Virtual Visits (Telemedicine).  Patients are able to view lab/test results, encounter notes, upcoming appointments, etc.  Non-urgent messages can be sent to your provider as well.   To learn more about what you can do with MyChart, go to [email or web address].    Your next appointment:   Based on results  The format for your next appointment:   In Person  Provider:   Larae Grooms, MD     Other Instructions  You have been referred to lipid clinic in [facility] office.  Please schedule new patient appointment   Please schedule EP follow up for later this year  How to Prepare for Your Cardiac PET/CT Stress  Test:  1. Please do not take these medications before your test: do not take metoprolol and cardizem the morning of the test  Medications that may interfere with the cardiac pharmacological stress agent (ex. nitrates - including erectile dysfunction medications or beta-blockers) the day of the exam. (Erectile dysfunction medication should be held for at least 72 hrs prior to test) Theophylline containing medications for 12 hours. Dipyridamole 48 hours prior to the test. Your remaining medications may be taken with water.  2. Nothing to eat or drink, except water, 3 hours prior to arrival time.   NO caffeine/decaffeinated products, or chocolate 12 hours prior to arrival.  3. NO perfume, cologne or lotion  4. Total time is 1 to 2 hours; you may want to bring reading material for the waiting time.  5. Please report to Admitting at the [facility] 60 minutes early for your test.  [place], [place] [address]   In preparation for your appointment, medication and supplies will be purchased.  Appointment availability is limited, so if you need to cancel or reschedule, please call the Radiology Department at [phone]  24 hours in advance to avoid a cancellation fee of $100.00  What to Expect After you Arrive:  Once you arrive and check in for your appointment, you will be taken to a preparation room within the Radiology Department.  A technologist or Nurse will obtain your medical history, verify that you are correctly prepped for the exam, and explain the  procedure.  Afterwards,  an IV will be started in your arm and electrodes will be placed on your skin for EKG monitoring during the stress portion of the exam. Then you will be escorted to the PET/CT scanner.  There, staff will get you positioned on the scanner and obtain a blood pressure and EKG.  During the exam, you will continue to be connected to the EKG and blood pressure machines.  A small, safe  amount of a radioactive tracer will be injected in your IV to obtain a series of pictures of your heart along with an injection of a stress agent.    After your Exam:  It is recommended that you eat a meal and drink a caffeinated beverage to counter act any effects of the stress agent.  Drink plenty of fluids for the remainder of the day and urinate frequently for the first couple of hours after the exam.  Your doctor will inform you of your test results within 7-10 business days.  For questions about your test or how to prepare for your test, please call: Marchia Bond, Cardiac Imaging Nurse Navigator  Gordy Clement, Cardiac Imaging Nurse Navigator Office: [phone]   Important Information About Sugar

## 2022-07-15 ENCOUNTER — Ambulatory Visit (INDEPENDENT_AMBULATORY_CARE_PROVIDER_SITE_OTHER): Payer: PPO

## 2022-07-15 DIAGNOSIS — I471 Supraventricular tachycardia, unspecified: Secondary | ICD-10-CM | POA: Diagnosis not present

## 2022-07-16 DIAGNOSIS — Z03818 Encounter for observation for suspected exposure to other biological agents ruled out: Secondary | ICD-10-CM | POA: Diagnosis not present

## 2022-07-16 DIAGNOSIS — J42 Unspecified chronic bronchitis: Secondary | ICD-10-CM | POA: Diagnosis not present

## 2022-07-16 DIAGNOSIS — R0981 Nasal congestion: Secondary | ICD-10-CM | POA: Diagnosis not present

## 2022-07-16 DIAGNOSIS — R051 Acute cough: Secondary | ICD-10-CM | POA: Diagnosis not present

## 2022-07-16 LAB — CUP PACEART REMOTE DEVICE CHECK
Date Time Interrogation Session: 20231103230711
Implantable Pulse Generator Implant Date: 20210322

## 2022-08-05 ENCOUNTER — Ambulatory Visit: Payer: PPO | Attending: Cardiovascular Disease | Admitting: Pharmacist

## 2022-08-05 DIAGNOSIS — N5201 Erectile dysfunction due to arterial insufficiency: Secondary | ICD-10-CM | POA: Diagnosis not present

## 2022-08-05 DIAGNOSIS — R3912 Poor urinary stream: Secondary | ICD-10-CM | POA: Diagnosis not present

## 2022-08-05 DIAGNOSIS — E785 Hyperlipidemia, unspecified: Secondary | ICD-10-CM | POA: Insufficient documentation

## 2022-08-05 DIAGNOSIS — N401 Enlarged prostate with lower urinary tract symptoms: Secondary | ICD-10-CM | POA: Diagnosis not present

## 2022-08-05 NOTE — Progress Notes (Signed)
Patient ID: Jerry Ruiz                 DOB: 1951-01-07                    MRN: [id number]      HPI: Jerry Ruiz is a 71 y.o. male patient referred to lipid clinic by Dr. Irish Lack. PMH is significant for history of pericardial cyst (on CT scan 2013), pre-diabetic (A1c 6.2), HTN, and SVT. In October 2022, patient had a coronary artery calcium scan and a score of 298 which is in the 65th percentile for his age, gender, and race. Was recommended at that time to be started on rosuvastatin 10 mg daily, but declined starting medication at that time. Reason behind not wanting a statin medication due to father and mother-in-law developing Parkinson's while being on a statin medication. Last lipid panel was in 05/2022: LDL-C 92, HDL 26, TG 243. He was a previous smoker (quit over 50 years ago). Patient's ASCVD risk is calculated to be 34.1%.   Patient presents today to discuss options for managing his hyperlipidemia and discussing other options besides statin medications. Discussed with the patient the inconclusive data between a statin and neurocognitive diseases such as Parkinson's, but he is addiment on not starting one ever because he had to watch his father decline. Did not push any further. We talked about using zetia for his cholesterol, but patient does not want to add any other new medications to his regimen and will make a final decision after his NM PET/CT scan in December. Patient is getting the NM PET/CT due to concerns of shortness of breath that he noticed on vacation in [place]. He is not as concerned about his cholesterol due to past blood test done by Dr. Rayford Halsted saying he had light, fluffy particles.  Patient and wife recently had RSV and has led to decrease in physical activity. Before RSV, was going to [employer] and walking for a half hour and doing other exercises for another half hour. Tend to stay ~1h and go 5 days/week. He has not been initiating changes in his diet despite  discussion with Dr. Irish Lack and his blood pressure. Patient states he eats a lot of sugar and his wife and him tend to go out to eat every day for lunch. He drinks a lot of sweet tea, likes coca-cola, but wife stops him from having more than 1 in a 1-2 week period, drinks no coffee, and maybe 1 beer per month with pizza. Patient states cooking at home is nearly impossible with the mess and its just easier to go out to eat  He is also concerned about the medication options we discussed today and the potential side effects with starting them. He gets overwhelmed by the side effects listed and states why he does not like adding new medication, but is starting tamsulosin to help with BPH. He is trying to be more compliant with his CPAP machine, but only wears for 4-5 hours when should be getting at least 7 hours. Patient asked questions about vaccinations and if he was eligible to get the RSV vaccination along with the pneumococcal vaccine and shingrix (had zostavax in the past).  Current Medications: None Intolerances: N/A Risk Factors: CAC score of 298, HTN, obesity LDL goal: <70  Diet:  -Breakfast: drinks water w/ powder and breakfast bar -Lunch (1400-1500):  tend to go out a tremendous amount:  [place], [employer] (rib-eye, chicken wings,  fried food, pizza/hamburgers -Drinks tea (half/half) daily, no coffee,coca-cola 1x 1-2 weeks,1 beer/month -daughter makes cakes: pound cakes   Exercise: trying to exercise last 2 weeks: but just getting over RSV Was exercising 5x/week--> goes to [facility] and go for 1h and walk/weight lifting  Family History:  Family History  Problem Relation Age of Onset   Arrhythmia Mother    Hypertension Mother    Heart disease Mother    Parkinson's disease Father    Hypertension Father      Social History:  reports that he quit smoking about 50 years ago. His smoking use included cigarettes. He has never used smokeless tobacco. He reports current alcohol use. He  reports that he does not use drugs.   Labs: -Lipid panel 05/2022: TG 234, LDL-C 92, HDL-C 26 (no medication)  Past Medical History:  Diagnosis Date   Arthritis    Bifascicular block    pvc's   Bladder stone    Headache    History of echocardiogram    a. Echo 12/15: EF 55-60%, no RWMA, mild LAE, trivial MR   History of echocardiogram    Echo 12/17: mod conc LVH, EF 55-60, no RWMA, Gr 1 DD, mild AI, calcified AV leaflets, trivial MR, mod LAE, normal RVSF, mild TR, no pericardial eff   HTN (hypertension)    Pneumonia    PVC's (premature ventricular contractions)    Skin cancer    melanoma bil ears   Sleep apnea    cpap    Current Outpatient Medications on File Prior to Visit  Medication Sig Dispense Refill   Cholecalciferol (VITAMIN D) 50 MCG (2000 UT) tablet Take 2,000 Units by mouth every other day.     diltiazem (DILACOR XR) 240 MG 24 hr capsule Take 1 tablet by mouth daily.     FISH OIL-COENZYME Q10 PO Take 1 capsule by mouth daily.      furosemide (LASIX) 20 MG tablet      ibuprofen (ADVIL) 800 MG tablet Take 1 tablet (800 mg total) by mouth every 8 (eight) hours as needed. 30 tablet 0   irbesartan (AVAPRO) 300 MG tablet Take 1 tablet (300 mg total) by mouth daily. 90 tablet 3   L-ARGININE PO Take 1,000 mg by mouth daily.     metoprolol succinate (TOPROL-XL) 50 MG 24 hr tablet Take 25 mg by mouth daily. Take with or immediately following a meal.     Misc Natural Products (PROSTATE SUPPORT PO) Take 2 tablets by mouth daily. Prostate Pro otc supplement     Multiple Vitamin (MULTIVITAMIN) capsule Take 1 capsule by mouth daily.     Probiotic Product (PROBIOTIC DAILY PO) Take 1 tablet by mouth at bedtime.      spironolactone (ALDACTONE) 25 MG tablet Take 1 tablet (25 mg total) by mouth daily. 90 tablet 3   No current facility-administered medications on file prior to visit.    Allergies  Allergen Reactions   Carvedilol Other (See Comments)   Cephalexin Other (See Comments)    Losartan Potassium Other (See Comments)   Amlodipine Other (See Comments)    Low energy    Sulfa Antibiotics Rash, Hives and Itching    Assessment/Plan: Hyperlipidemia Assessment: Patient has a CAC of 298, LDL-C 92 (goal <70), TG 234 Patient is not interested in starting a lipid lowering medication at this time after going over alternative options Very hesitant about starting new medications due to long list of side effects associated with them especially the ones seen on  TV commercials Does not want to discuss statins due to history of Parkinson's disease in father and mother-in-law Recently getting over RSV which has limited his physical activity and has not been going to gym 5x/week Had questions about eligibility for RSV, pneumonia, and Shingrix vaccine Does not watch what he eats in his diet: drink sweet tea daily, goes out to eat daily, daughter bakes cakes--> no coffee and very little alcohol  Plan: No new medications started at this time, patient would like to follow up after his NM PET/CT in December Discussed zetia with the patient and would help lower his LDL-C and get him closer to goal, but would like to hold off for now Educated patient that in clinical trials they have to report all adverse events while patient is taking the medication even if it is not associated with the medication, but were in agreement that medications do have side effects to look out for Discussed patient's eligibility for the RSV and Shingrix vaccine and to follow up with PCP if he needs another dose of pneumococcal vaccine Try to increase physical activity back to 5x/week for 1 hour at [employer] Monitor sodium intake, try to choose healthier options on the menu, limit sweet tea intake, increase water intake.   Thank you,  Sandford Craze, PharmD. [facility] Acute Care PGY-1 08/05/2022 4:38 PM   Ramond Dial, Pharm.D, BCPS, CPP [place]  [address],  [place], [place] [address]  Phone: [phone]; Fax: [phone]

## 2022-08-05 NOTE — Assessment & Plan Note (Signed)
Assessment: Patient has a CAC of 298, LDL-C 92 (goal <70), TG 234 Patient is not interested in starting a lipid lowering medication at this time after going over alternative options Very hesitant about starting new medications due to long list of side effects associated with them especially the ones seen on TV commercials Does not want to discuss statins due to history of Parkinson's disease in father and mother-in-law Recently getting over RSV which has limited his physical activity and has not been going to gym 5x/week Had questions about eligibility for RSV, pneumonia, and Shingrix vaccine Does not watch what he eats in his diet: drink sweet tea daily, goes out to eat daily, daughter bakes cakes--> no coffee and very little alcohol  Plan: No new medications started at this time, patient would like to follow up after his NM PET/CT in December Discussed zetia with the patient and would help lower his LDL-C and get him closer to goal, but would like to hold off for now Educated patient that in clinical trials they have to report all adverse events while patient is taking the medication even if it is not associated with the medication, but were in agreement that medications do have side effects to look out for Discussed patient's eligibility for the RSV and Shingrix vaccine and to follow up with PCP if he needs another dose of pneumococcal vaccine Try to increase physical activity back to 5x/week for 1 hour at [employer] Monitor sodium intake, try to choose healthier options on the menu, limit sweet tea intake, increase water intake.

## 2022-08-05 NOTE — Patient Instructions (Addendum)
-  Look into Pneumonia vaccine and double check with primary doctor -Shingrix vaccine: 2 doses--> 1 dose and then 2-6 months get next dose -RSV vaccine -CPAP: try to wear for ~7 hours a day -Diet: try to limit going out to eat or eating healthier options--> more lean meats, limit salt consumption, cut back on tea or add less sugar -Physical activity: start to increase physical activity back to 5x/week and continue walking for 30 minutes and adding lifting weights -Follow up after PET scan- will reach out: [phone]

## 2022-08-06 ENCOUNTER — Ambulatory Visit: Payer: PPO

## 2022-08-09 DIAGNOSIS — E291 Testicular hypofunction: Secondary | ICD-10-CM | POA: Diagnosis not present

## 2022-08-09 DIAGNOSIS — R5383 Other fatigue: Secondary | ICD-10-CM | POA: Diagnosis not present

## 2022-08-12 DIAGNOSIS — E669 Obesity, unspecified: Secondary | ICD-10-CM | POA: Diagnosis not present

## 2022-08-12 DIAGNOSIS — E291 Testicular hypofunction: Secondary | ICD-10-CM | POA: Diagnosis not present

## 2022-08-12 DIAGNOSIS — G479 Sleep disorder, unspecified: Secondary | ICD-10-CM | POA: Diagnosis not present

## 2022-08-12 DIAGNOSIS — M2550 Pain in unspecified joint: Secondary | ICD-10-CM | POA: Diagnosis not present

## 2022-08-12 DIAGNOSIS — I10 Essential (primary) hypertension: Secondary | ICD-10-CM | POA: Diagnosis not present

## 2022-08-12 DIAGNOSIS — G4733 Obstructive sleep apnea (adult) (pediatric): Secondary | ICD-10-CM | POA: Diagnosis not present

## 2022-08-12 DIAGNOSIS — Z6831 Body mass index (BMI) 31.0-31.9, adult: Secondary | ICD-10-CM | POA: Diagnosis not present

## 2022-08-15 NOTE — Progress Notes (Signed)
Carelink Summary Report / Loop Recorder 

## 2022-08-18 NOTE — Progress Notes (Unsigned)
Cardiology Office Note Date:  08/20/2022  Patient ID:  Jerry Ruiz, Jerry Ruiz May 25, 1951, MRN [id number] PCP:  Kathalene Frames, MD  Cardiologist:  Dr. Irish Lack Electrophysiologist: Dr. Rayann Heman  >> Quentin Ore     Chief Complaint:  annual visit  History of Present Illness: Jerry Ruiz is a 71 y.o. male with history of HTN, PVCs, OSA w/CPAP, pericadial cyst (by CT 2013, not seen on echos)  He saw Dr. Rayann Heman 08/17/21, doing well, bothered by ED, on BB.  Discussed therapy options, meds, urology consult pt was going to think about it. Discussed palpitations with a short episode of atach 6/22 by ILR.  AF burden is 0%.  PVCs 2.5% He has severe LA enlargement but no history of afib No changes were made.  He saw Dr. Irish Lack 06/27/22, with some increased SOB and Coronary Ca on prior imaging planned for PET/CT, he was not in favor of statin with concerns of causing Parkinson's in some family members  PET scheduled for 08/27/22  Today he reports doing well. He does continue to have some PVCs, sometimes can feel palpitations (like at church, or relaxing watching TV). He is happy at current level of control, and requests to stop or lower doses of any medications possible.   He denies CP, syncope or pre-syncope. He is having some SOB - noted recently was traveling with friends and hiking and could not keep up with peers. Gen cardiologist is aware of this and further workup planned, as above.  He is continuing to have ED, PCP has started him on daily cialis, has been on it for 1 week without improvement.   We discussed Dr. Rayann Heman leaving, patient agreeable to transfer care to Dr. Quentin Ore  Device information MDT LINQ II implanted 11/29/19, for palpitations  There is mention of amiodarone briefly in the past with a noted hand tremor   Past Medical History:  Diagnosis Date   Arthritis    Bifascicular block    pvc's   Bladder stone    Headache    History of echocardiogram    a. Echo 12/15:  EF 55-60%, no RWMA, mild LAE, trivial MR   History of echocardiogram    Echo 12/17: mod conc LVH, EF 55-60, no RWMA, Gr 1 DD, mild AI, calcified AV leaflets, trivial MR, mod LAE, normal RVSF, mild TR, no pericardial eff   HTN (hypertension)    Pneumonia    PVC's (premature ventricular contractions)    Skin cancer    melanoma bil ears   Sleep apnea    cpap    Past Surgical History:  Procedure Laterality Date   HEMORROIDECTOMY     implantable loop recorder implantation  11/29/2019   Medtronic Reveal Linq model LNQ 22 (SN [id number]) implanted by Dr Rayann Heman for further evaluation of palpitations and arrhythmia management   INGUINAL HERNIA REPAIR Bilateral 03/28/2020   Procedure: LAPAROSCOPIC BILATERAL INGUINAL HERNIA REPAIR WITH MESH;  Surgeon: Kinsinger, Arta Bruce, MD;  Location: [facility];  Service: General;  Laterality: Bilateral;   KNEE SURGERY     arthroscopic bil   MOHS SURGERY     right big toe     bone removed   SHOULDER SURGERY     rotator right    TONSILLECTOMY     VASECTOMY     1987    Current Outpatient Medications  Medication Sig Dispense Refill   Cholecalciferol (VITAMIN D) 50 MCG (2000 UT) tablet Take 2,000 Units by mouth every other day.  diltiazem (DILACOR XR) 240 MG 24 hr capsule Take 1 tablet by mouth daily.     FISH OIL-COENZYME Q10 PO Take 1 capsule by mouth daily.      furosemide (LASIX) 20 MG tablet      ibuprofen (ADVIL) 800 MG tablet Take 1 tablet (800 mg total) by mouth every 8 (eight) hours as needed. 30 tablet 0   irbesartan (AVAPRO) 300 MG tablet Take 1 tablet (300 mg total) by mouth daily. 90 tablet 3   L-ARGININE PO Take 1,000 mg by mouth daily.     metoprolol succinate (TOPROL-XL) 50 MG 24 hr tablet Take 25 mg by mouth daily. Take with or immediately following a meal.     Misc Natural Products (PROSTATE SUPPORT PO) Take 2 tablets by mouth daily. Prostate Pro otc supplement     Multiple Vitamin (MULTIVITAMIN) capsule Take 1 capsule by mouth daily.      Probiotic Product (PROBIOTIC DAILY PO) Take 1 tablet by mouth at bedtime.      spironolactone (ALDACTONE) 25 MG tablet Take 1 tablet (25 mg total) by mouth daily. 90 tablet 3   tadalafil (CIALIS) 5 MG tablet Take 5 mg by mouth daily.     No current facility-administered medications for this visit.    Allergies:   Carvedilol, Cephalexin, Losartan potassium, Amlodipine, and Sulfa antibiotics   Social History:  The patient  reports that he quit smoking about 50 years ago. His smoking use included cigarettes. He has never used smokeless tobacco. He reports current alcohol use. He reports that he does not use drugs.   Family History:  The patient's family history includes Arrhythmia in his mother; Heart disease in his mother; Hypertension in his father and mother; Parkinson's disease in his father.  ROS:  Please see the history of present illness.    All other systems are reviewed and otherwise negative.   PHYSICAL EXAM:  VS:  BP 138/70   Pulse 69   Ht 6' (1.829 m)   Wt 233 lb (105.7 kg)   SpO2 99%   BMI 31.60 kg/m  BMI: Body mass index is 31.6 kg/m. Well nourished, well developed, in no acute distress HEENT: normocephalic, atraumatic Neck: no JVD, carotid bruits or masses Cardiac:  RRR; no significant murmurs, no rubs, or gallops Lungs:  CTA b/l, no wheezing, rhonchi or rales Abd: soft, nontender MS: no deformity or atrophy Ext: no edema Skin: warm and dry, no rash Neuro:  No gross deficits appreciated Psych: euthymic mood, full affect   ILR site is stable, no tethering or discomfort   EKG:  not done today  Device interrogation done today and reviewed by myself:  SR, rare PVCs  05/31/22: TTE 1. Left ventricular ejection fraction, by estimation, is 60 to 65%. The  left ventricle has normal function. The left ventricle has no regional  wall motion abnormalities. There is moderate concentric left ventricular  hypertrophy. Left ventricular  diastolic parameters were  normal.   2. Right ventricular systolic function is normal. The right ventricular  size is normal.   3. Left atrial size was mild to moderately dilated.   4. The mitral valve is normal in structure. Trivial mitral valve  regurgitation. No evidence of mitral stenosis.   5. The aortic valve is tricuspid. Aortic valve regurgitation is mild. No  aortic stenosis is present.   6. The inferior vena cava is normal in size with greater than 50%  respiratory variability, suggesting right atrial pressure of 3 mmHg.  Comparison(s): No significant change from prior study.   Recent Labs: No results found for requested labs within last 365 days.  No results found for requested labs within last 365 days.   CrCl cannot be calculated (Patient's most recent lab result is older than the maximum 21 days allowed.).   Wt Readings from Last 3 Encounters:  08/20/22 233 lb (105.7 kg)  06/27/22 231 lb (104.8 kg)  08/17/21 233 lb 12.8 oz (106.1 kg)     Other studies reviewed: Additional studies/records reviewed today include: summarized above  ASSESSMENT AND PLAN:  Palpitations Low PVC burden, Atach Patient happy at current level of control and does not desire to increase dosages of medications  HTN Slightly elevated in clinic today, not taking regularly at home Encouraged to take BP a couple times a week and send readings to gen cards Encouraged regular exercise and low salt diet  HLD Has declined statins Pending Stress PET C/w Dr. Irish Lack and Pharm D team   Disposition: F/u with Dr. Quentin Ore or APP in 6 months Patient is agreeable to being seen in [facility] or [facility] office  Current medicines are reviewed at length with the patient today.  The patient did not have any concerns regarding medicines.  SignedMamie Levers, NP 08/20/22 12:34 PM   [place] [address] [phone] (office)  [phone] (fax)

## 2022-08-19 ENCOUNTER — Ambulatory Visit (INDEPENDENT_AMBULATORY_CARE_PROVIDER_SITE_OTHER): Payer: PPO

## 2022-08-19 DIAGNOSIS — R002 Palpitations: Secondary | ICD-10-CM | POA: Diagnosis not present

## 2022-08-19 LAB — CUP PACEART REMOTE DEVICE CHECK
Date Time Interrogation Session: 20231210232237
Implantable Pulse Generator Implant Date: 20210322

## 2022-08-20 ENCOUNTER — Encounter: Payer: Self-pay | Admitting: Physician Assistant

## 2022-08-20 ENCOUNTER — Ambulatory Visit: Payer: PPO | Attending: Physician Assistant | Admitting: Cardiology

## 2022-08-20 VITALS — BP 138/70 | HR 69 | Ht 72.0 in | Wt 233.0 lb

## 2022-08-20 DIAGNOSIS — I471 Supraventricular tachycardia, unspecified: Secondary | ICD-10-CM

## 2022-08-20 DIAGNOSIS — I493 Ventricular premature depolarization: Secondary | ICD-10-CM | POA: Diagnosis not present

## 2022-08-20 DIAGNOSIS — I10 Essential (primary) hypertension: Secondary | ICD-10-CM | POA: Diagnosis not present

## 2022-08-20 NOTE — Patient Instructions (Signed)
Medication Instructions:   Your physician recommends that you continue on your current medications as directed. Please refer to the Current Medication list given to you today.  *If you need a refill on your cardiac medications before your next appointment, please call your pharmacy*   Lab Work: [place]   If you have labs (blood work) drawn today and your tests are completely normal, you will receive your results only by: [place] (if you have MyChart) OR A paper copy in the mail If you have any lab test that is abnormal or we need to change your treatment, we will call you to review the results.   Testing/Procedures: NONE ORDERED  TODAY     Follow-Up: At [facility], you and your health needs are our priority.  As part of our continuing mission to provide you with exceptional heart care, we have created designated Provider Care Teams.  These Care Teams include your primary Cardiologist (physician) and Advanced Practice Providers (APPs -  Physician Assistants and Nurse Practitioners) who all work together to provide you with the care you need, when you need it.  We recommend signing up for the patient portal called "MyChart".  Sign up information is provided on this After Visit Summary.  MyChart is used to connect with patients for Virtual Visits (Telemedicine).  Patients are able to view lab/test results, encounter notes, upcoming appointments, etc.  Non-urgent messages can be sent to your provider as well.   To learn more about what you can do with MyChart, go to [email or web address].    Your next appointment:   1 year(s)  The format for your next appointment:   In Person  Provider:   You may see Vickie Epley, MD or one of the following Advanced Practice Providers on your designated Care Team:   Tommye Standard, [place] Legrand Como "Jonni Sanger" Chalmers Cater, [place]    Other Instructions   Important Information About Sugar

## 2022-08-23 ENCOUNTER — Telehealth (HOSPITAL_COMMUNITY): Payer: Self-pay | Admitting: *Deleted

## 2022-08-23 NOTE — Telephone Encounter (Signed)
Reaching out to patient to offer assistance regarding upcoming cardiac imaging study; pt verbalizes understanding of appt date/time, parking situation and where to check in, pre-test NPO status and verified current allergies; name and call back number provided for further questions should they arise  Gordy Clement RN Navigator Cardiac Imaging [facility] [phone] office [phone] cell  Patient aware to avoid caffeine 12 hours prior and hold his Cialis 72 hours prior to his cardiac PET scan.

## 2022-08-27 ENCOUNTER — Ambulatory Visit (HOSPITAL_COMMUNITY)
Admission: RE | Admit: 2022-08-27 | Discharge: 2022-08-27 | Disposition: A | Discharge status: Home / Self Care | Payer: PPO | Source: Ambulatory Visit | Attending: Interventional Cardiology | Admitting: Interventional Cardiology

## 2022-08-27 DIAGNOSIS — I2584 Coronary atherosclerosis due to calcified coronary lesion: Secondary | ICD-10-CM | POA: Insufficient documentation

## 2022-08-27 DIAGNOSIS — R0609 Other forms of dyspnea: Secondary | ICD-10-CM | POA: Diagnosis not present

## 2022-08-27 DIAGNOSIS — I2510 Atherosclerotic heart disease of native coronary artery without angina pectoris: Secondary | ICD-10-CM | POA: Diagnosis not present

## 2022-08-27 MED ORDER — RUBIDIUM RB82 GENERATOR (RUBYFILL)
27.2000 | PACK | Freq: Once | INTRAVENOUS | Status: AC
  Administered 2022-08-27: 27.2 via INTRAVENOUS

## 2022-08-27 MED ORDER — REGADENOSON 0.4 MG/5ML IV SOLN
INTRAVENOUS | Status: AC
  Administered 2022-08-27: 0.4 mg via INTRAVENOUS
  Filled 2022-08-27: qty 5

## 2022-08-27 MED ORDER — RUBIDIUM RB82 GENERATOR (RUBYFILL): 27.2000 | PACK | INTRAVENOUS | Status: DC

## 2022-08-27 MED ORDER — REGADENOSON 0.4 MG/5ML IV SOLN: 0.4000 mg | Freq: Once | INTRAVENOUS | Status: AC

## 2022-08-28 LAB — NM PET CT CARDIAC PERFUSION MULTI W/ABSOLUTE BLOODFLOW
LV dias vol: 146 mL (ref 62–150)
LV sys vol: 67 mL
MBFR: 2.6
Nuc Rest EF: 54 %
Nuc Stress EF: 67 %
Peak HR: 94 {beats}/min
Rest HR: 80 {beats}/min
Rest MBF: 0.67 ml/g/min
Rest Nuclear Isotope Dose: 27.2 mCi
ST Depression (mm): 0 mm
Stress MBF: 1.74 ml/g/min
Stress Nuclear Isotope Dose: 27.2 mCi
TID: 1

## 2022-09-20 DIAGNOSIS — I700 Atherosclerosis of aorta: Secondary | ICD-10-CM | POA: Diagnosis not present

## 2022-09-20 DIAGNOSIS — Z79899 Other long term (current) drug therapy: Secondary | ICD-10-CM | POA: Diagnosis not present

## 2022-09-20 DIAGNOSIS — E7800 Pure hypercholesterolemia, unspecified: Secondary | ICD-10-CM | POA: Diagnosis not present

## 2022-09-20 DIAGNOSIS — R350 Frequency of micturition: Secondary | ICD-10-CM | POA: Diagnosis not present

## 2022-09-20 DIAGNOSIS — R251 Tremor, unspecified: Secondary | ICD-10-CM | POA: Diagnosis not present

## 2022-09-20 DIAGNOSIS — E291 Testicular hypofunction: Secondary | ICD-10-CM | POA: Diagnosis not present

## 2022-09-20 DIAGNOSIS — I4711 Inappropriate sinus tachycardia, so stated: Secondary | ICD-10-CM | POA: Diagnosis not present

## 2022-09-20 DIAGNOSIS — I493 Ventricular premature depolarization: Secondary | ICD-10-CM | POA: Diagnosis not present

## 2022-09-20 DIAGNOSIS — G4733 Obstructive sleep apnea (adult) (pediatric): Secondary | ICD-10-CM | POA: Diagnosis not present

## 2022-09-20 DIAGNOSIS — I10 Essential (primary) hypertension: Secondary | ICD-10-CM | POA: Diagnosis not present

## 2022-09-23 ENCOUNTER — Ambulatory Visit (INDEPENDENT_AMBULATORY_CARE_PROVIDER_SITE_OTHER): Payer: PPO

## 2022-09-23 DIAGNOSIS — R002 Palpitations: Secondary | ICD-10-CM | POA: Diagnosis not present

## 2022-09-24 LAB — CUP PACEART REMOTE DEVICE CHECK
Date Time Interrogation Session: 20240112231317
Implantable Pulse Generator Implant Date: 20210322

## 2022-09-26 NOTE — Progress Notes (Signed)
[facility] Summary Report / Loop Recorder

## 2022-10-14 DIAGNOSIS — E669 Obesity, unspecified: Secondary | ICD-10-CM | POA: Diagnosis not present

## 2022-10-14 DIAGNOSIS — I10 Essential (primary) hypertension: Secondary | ICD-10-CM | POA: Diagnosis not present

## 2022-10-14 DIAGNOSIS — G4733 Obstructive sleep apnea (adult) (pediatric): Secondary | ICD-10-CM | POA: Diagnosis not present

## 2022-10-27 LAB — CUP PACEART REMOTE DEVICE CHECK
Date Time Interrogation Session: 20240214231920
Implantable Pulse Generator Implant Date: 20210322

## 2022-10-28 ENCOUNTER — Ambulatory Visit (INDEPENDENT_AMBULATORY_CARE_PROVIDER_SITE_OTHER): Payer: PPO

## 2022-10-28 DIAGNOSIS — R002 Palpitations: Secondary | ICD-10-CM | POA: Diagnosis not present

## 2022-11-05 NOTE — Progress Notes (Signed)
Carelink Summary Report / Loop Recorder 

## 2022-11-06 DIAGNOSIS — N401 Enlarged prostate with lower urinary tract symptoms: Secondary | ICD-10-CM | POA: Diagnosis not present

## 2022-11-06 DIAGNOSIS — R3912 Poor urinary stream: Secondary | ICD-10-CM | POA: Diagnosis not present

## 2022-11-06 DIAGNOSIS — R350 Frequency of micturition: Secondary | ICD-10-CM | POA: Diagnosis not present

## 2022-11-11 DIAGNOSIS — E291 Testicular hypofunction: Secondary | ICD-10-CM | POA: Diagnosis not present

## 2022-11-11 DIAGNOSIS — R5383 Other fatigue: Secondary | ICD-10-CM | POA: Diagnosis not present

## 2022-11-11 DIAGNOSIS — Z79890 Hormone replacement therapy (postmenopausal): Secondary | ICD-10-CM | POA: Diagnosis not present

## 2022-11-13 DIAGNOSIS — R454 Irritability and anger: Secondary | ICD-10-CM | POA: Diagnosis not present

## 2022-11-13 DIAGNOSIS — Z6831 Body mass index (BMI) 31.0-31.9, adult: Secondary | ICD-10-CM | POA: Diagnosis not present

## 2022-11-13 DIAGNOSIS — R6882 Decreased libido: Secondary | ICD-10-CM | POA: Diagnosis not present

## 2022-11-13 DIAGNOSIS — M2550 Pain in unspecified joint: Secondary | ICD-10-CM | POA: Diagnosis not present

## 2022-11-13 DIAGNOSIS — I493 Ventricular premature depolarization: Secondary | ICD-10-CM | POA: Diagnosis not present

## 2022-11-13 DIAGNOSIS — N529 Male erectile dysfunction, unspecified: Secondary | ICD-10-CM | POA: Diagnosis not present

## 2022-11-13 DIAGNOSIS — I10 Essential (primary) hypertension: Secondary | ICD-10-CM | POA: Diagnosis not present

## 2022-11-13 DIAGNOSIS — E291 Testicular hypofunction: Secondary | ICD-10-CM | POA: Diagnosis not present

## 2022-12-02 ENCOUNTER — Ambulatory Visit: Payer: PPO | Attending: Cardiology

## 2022-12-02 DIAGNOSIS — R002 Palpitations: Secondary | ICD-10-CM

## 2022-12-02 LAB — CUP PACEART REMOTE DEVICE CHECK
Date Time Interrogation Session: 20240324232245
Implantable Pulse Generator Implant Date: 20210322

## 2022-12-11 NOTE — Progress Notes (Signed)
Carelink Summary Report / Loop Recorder 

## 2023-01-06 ENCOUNTER — Ambulatory Visit (INDEPENDENT_AMBULATORY_CARE_PROVIDER_SITE_OTHER): Payer: PPO

## 2023-01-06 DIAGNOSIS — I471 Supraventricular tachycardia, unspecified: Secondary | ICD-10-CM

## 2023-01-06 LAB — CUP PACEART REMOTE DEVICE CHECK
Date Time Interrogation Session: 20240426230456
Implantable Pulse Generator Implant Date: 20210322

## 2023-01-10 NOTE — Progress Notes (Signed)
Carelink Summary Report / Loop Recorder 

## 2023-02-05 ENCOUNTER — Ambulatory Visit (INDEPENDENT_AMBULATORY_CARE_PROVIDER_SITE_OTHER): Payer: PPO

## 2023-02-05 DIAGNOSIS — I471 Supraventricular tachycardia, unspecified: Secondary | ICD-10-CM

## 2023-02-06 LAB — CUP PACEART REMOTE DEVICE CHECK
Date Time Interrogation Session: 20240529230932
Implantable Pulse Generator Implant Date: 20210322

## 2023-02-06 NOTE — Progress Notes (Signed)
Carelink Summary Report / Loop Recorder 

## 2023-02-11 DIAGNOSIS — Z79890 Hormone replacement therapy (postmenopausal): Secondary | ICD-10-CM | POA: Diagnosis not present

## 2023-02-11 DIAGNOSIS — E291 Testicular hypofunction: Secondary | ICD-10-CM | POA: Diagnosis not present

## 2023-02-13 DIAGNOSIS — N529 Male erectile dysfunction, unspecified: Secondary | ICD-10-CM | POA: Diagnosis not present

## 2023-02-13 DIAGNOSIS — R454 Irritability and anger: Secondary | ICD-10-CM | POA: Diagnosis not present

## 2023-02-13 DIAGNOSIS — H5203 Hypermetropia, bilateral: Secondary | ICD-10-CM | POA: Diagnosis not present

## 2023-02-13 DIAGNOSIS — H1045 Other chronic allergic conjunctivitis: Secondary | ICD-10-CM | POA: Diagnosis not present

## 2023-02-13 DIAGNOSIS — E291 Testicular hypofunction: Secondary | ICD-10-CM | POA: Diagnosis not present

## 2023-02-13 DIAGNOSIS — Z6831 Body mass index (BMI) 31.0-31.9, adult: Secondary | ICD-10-CM | POA: Diagnosis not present

## 2023-02-13 DIAGNOSIS — M2550 Pain in unspecified joint: Secondary | ICD-10-CM | POA: Diagnosis not present

## 2023-02-13 DIAGNOSIS — H2513 Age-related nuclear cataract, bilateral: Secondary | ICD-10-CM | POA: Diagnosis not present

## 2023-02-13 DIAGNOSIS — H35033 Hypertensive retinopathy, bilateral: Secondary | ICD-10-CM | POA: Diagnosis not present

## 2023-02-25 ENCOUNTER — Other Ambulatory Visit: Payer: Self-pay | Admitting: Internal Medicine

## 2023-02-25 ENCOUNTER — Ambulatory Visit
Admission: RE | Admit: 2023-02-25 | Discharge: 2023-02-25 | Disposition: A | Discharge status: Home / Self Care | Payer: PPO | Source: Ambulatory Visit | Attending: Internal Medicine | Admitting: Internal Medicine

## 2023-02-25 DIAGNOSIS — I493 Ventricular premature depolarization: Secondary | ICD-10-CM | POA: Diagnosis not present

## 2023-02-25 DIAGNOSIS — M25552 Pain in left hip: Secondary | ICD-10-CM

## 2023-02-25 DIAGNOSIS — I471 Supraventricular tachycardia, unspecified: Secondary | ICD-10-CM | POA: Diagnosis not present

## 2023-02-25 DIAGNOSIS — E7800 Pure hypercholesterolemia, unspecified: Secondary | ICD-10-CM | POA: Diagnosis not present

## 2023-02-25 DIAGNOSIS — E291 Testicular hypofunction: Secondary | ICD-10-CM | POA: Diagnosis not present

## 2023-02-25 DIAGNOSIS — I10 Essential (primary) hypertension: Secondary | ICD-10-CM | POA: Diagnosis not present

## 2023-02-25 DIAGNOSIS — R931 Abnormal findings on diagnostic imaging of heart and coronary circulation: Secondary | ICD-10-CM | POA: Diagnosis not present

## 2023-02-25 DIAGNOSIS — J42 Unspecified chronic bronchitis: Secondary | ICD-10-CM | POA: Diagnosis not present

## 2023-02-25 DIAGNOSIS — G4733 Obstructive sleep apnea (adult) (pediatric): Secondary | ICD-10-CM | POA: Diagnosis not present

## 2023-02-25 DIAGNOSIS — R251 Tremor, unspecified: Secondary | ICD-10-CM | POA: Diagnosis not present

## 2023-02-25 DIAGNOSIS — R42 Dizziness and giddiness: Secondary | ICD-10-CM | POA: Diagnosis not present

## 2023-02-25 DIAGNOSIS — I700 Atherosclerosis of aorta: Secondary | ICD-10-CM | POA: Diagnosis not present

## 2023-02-27 NOTE — Progress Notes (Signed)
Carelink Summary Report / Loop Recorder 

## 2023-03-10 ENCOUNTER — Ambulatory Visit (INDEPENDENT_AMBULATORY_CARE_PROVIDER_SITE_OTHER): Payer: PPO

## 2023-03-10 DIAGNOSIS — R002 Palpitations: Secondary | ICD-10-CM | POA: Diagnosis not present

## 2023-03-11 LAB — CUP PACEART REMOTE DEVICE CHECK
Date Time Interrogation Session: 20240701230740
Implantable Pulse Generator Implant Date: 20210322

## 2023-03-27 NOTE — Progress Notes (Signed)
Carelink Summary Report / Loop Recorder 

## 2023-04-13 LAB — CUP PACEART REMOTE DEVICE CHECK
Date Time Interrogation Session: 20240803230249
Implantable Pulse Generator Implant Date: 20210322

## 2023-04-14 ENCOUNTER — Ambulatory Visit: Payer: PPO

## 2023-04-14 DIAGNOSIS — R002 Palpitations: Secondary | ICD-10-CM | POA: Diagnosis not present

## 2023-04-16 ENCOUNTER — Encounter (HOSPITAL_BASED_OUTPATIENT_CLINIC_OR_DEPARTMENT_OTHER): Payer: Self-pay | Admitting: Physical Therapy

## 2023-04-16 ENCOUNTER — Other Ambulatory Visit: Payer: Self-pay

## 2023-04-16 ENCOUNTER — Ambulatory Visit (HOSPITAL_BASED_OUTPATIENT_CLINIC_OR_DEPARTMENT_OTHER): Payer: PPO | Attending: Internal Medicine | Admitting: Physical Therapy

## 2023-04-16 DIAGNOSIS — M545 Low back pain, unspecified: Secondary | ICD-10-CM | POA: Diagnosis not present

## 2023-04-16 DIAGNOSIS — M25552 Pain in left hip: Secondary | ICD-10-CM | POA: Insufficient documentation

## 2023-04-16 DIAGNOSIS — M6281 Muscle weakness (generalized): Secondary | ICD-10-CM | POA: Diagnosis not present

## 2023-04-16 NOTE — Therapy (Signed)
OUTPATIENT PHYSICAL THERAPY LOWER EXTREMITY EVALUATION   Patient Name: Jerry Ruiz MRN: [id number] DOB:1951/07/19, 72 y.o., male Today's Date: 04/16/2023  END OF SESSION:  PT End of Session - 04/16/23 1416     Visit Number 1    Number of Visits 12    Date for PT Re-Evaluation 07/15/23    Authorization Type HTA    PT Start Time 1315    PT Stop Time 1410    PT Time Calculation (min) 55 min    Activity Tolerance Patient tolerated treatment well    Behavior During Therapy WFL for tasks assessed/performed             Past Medical History:  Diagnosis Date   Arthritis    Bifascicular block    pvc's   Bladder stone    Headache    History of echocardiogram    a. Echo 12/15: EF 55-60%, no RWMA, mild LAE, trivial MR   History of echocardiogram    Echo 12/17: mod conc LVH, EF 55-60, no RWMA, Gr 1 DD, mild AI, calcified AV leaflets, trivial MR, mod LAE, normal RVSF, mild TR, no pericardial eff   HTN (hypertension)    Pneumonia    PVC's (premature ventricular contractions)    Skin cancer    melanoma bil ears   Sleep apnea    cpap   Past Surgical History:  Procedure Laterality Date   HEMORROIDECTOMY     implantable loop recorder implantation  11/29/2019   Medtronic Reveal Linq model LNQ 22 ([place] G) implanted by Dr Johney Frame for further evaluation of palpitations and arrhythmia management   INGUINAL HERNIA REPAIR Bilateral 03/28/2020   Procedure: LAPAROSCOPIC BILATERAL INGUINAL HERNIA REPAIR WITH MESH;  Surgeon: Kinsinger, De Blanch, MD;  Location: [facility];  Service: General;  Laterality: Bilateral;   KNEE SURGERY     arthroscopic bil   MOHS SURGERY     right big toe     bone removed   SHOULDER SURGERY     rotator right    TONSILLECTOMY     VASECTOMY     1987   Patient Active Problem List   Diagnosis Date Noted   Hyperlipidemia 08/05/2022   Wide-complex tachycardia 09/15/2019   Morbid obesity (HCC) 09/15/2019   OSA (obstructive sleep apnea) 09/15/2019    Palpitations 08/18/2019   SVT (supraventricular tachycardia)    PVC's (premature ventricular contractions) 05/20/2014   Other malaise and fatigue 05/20/2014   Mitral valve disorders(424.0) 08/11/2013   Nonspecific abnormal unspecified cardiovascular function study    HTN (hypertension)     PCP:  Emilio Aspen, MD     REFERRING PROVIDER:  Emilio Aspen, MD     REFERRING DIAG: [phone] (ICD-10-CM) - Pain in left hip   THERAPY DIAG:  Pain, lumbar region  Pain in left hip  Muscle weakness (generalized)  Rationale for Evaluation and Treatment: Rehabilitation  ONSET DATE: 3 months ago  SUBJECTIVE:   SUBJECTIVE STATEMENT: Pt states he was going to the gym around 3 months ago about 4x/week. 10 mins walk, light machine, 30 mins walking. He was walking largely for cardio with a few UE machines. Very few LE machines. Pt started doing the back ext machine and felt pain 2 days after that. Feels like a very sore joint and point to upper L hip. Doesn't hurt to sit or sleep. Transfers after sitting still hurt the most. The pain varies from day to day with some days being worse than others. R foot surgery  in 90's and has not ever improved- has bunion on R. Pt denies NT. Pain stays within the upper hip, no pain past the knee. Pt denies cancer red flags. Denies crepitus in hip. Painful with dressing LE and crossing L LE. Stairs hurt going up and down. Feels pain is deep but can touch it. Denies feelings of LE weakness.   PERTINENT HISTORY: LBP,  PAIN:  Are you having pain? Yes: NPRS scale: 1/10 Pain location: L upper hip and down the leg Pain description: burning, sore,  Aggravating factors: standing up after sitting, stairs,  Relieving factors: laying down, sleeping  PRECAUTIONS: Other: Loop recorder implant  RED FLAGS: None   WEIGHT BEARING RESTRICTIONS: No  FALLS:  Has patient fallen in last 6 months? No  LIVING ENVIRONMENT: Lives with: lives with their  spouse Lives in: House/apartment Stairs:  Has following equipment at home: None  OCCUPATION: retired Curator  PLOF: Independent  PATIENT GOALS: reduce pain, return to exercise    OBJECTIVE:   DIAGNOSTIC FINDINGS:  FINDINGS: There is no evidence of hip fracture or dislocation. There is no evidence of arthropathy or other focal bone abnormality.   IMPRESSION: Negative.    PATIENT SURVEYS:  FOTO 54 68 @ DC 11 pts MCII  COGNITION: Overall cognitive status: Within functional limits for tasks assessed     SENSATION: WFL   MUSCLE LENGTH: Positive supine 90/90 HS Positive Ely's  POSTURE: decreased lumbar lordosis and increased thoracic kyphosis  PALPATION: Tenderness to palp patient to hypertonicity of left lumbar extensors as well as left glutes and hip rotators; joint extension stiffness on left lumbar L3-5  Lumbar active range of motion: Stiff and limited in all planes but does not recreate left hip pain  LOWER EXTREMITY ROM:  Passive ROM Right eval Left eval  Hip flexion 100 pain 100  Hip extension -5 -5  Hip abduction    Hip adduction    Hip internal rotation 20 15 p!  Hip external rotation 40  35 pain  Knee flexion    Knee extension    Ankle dorsiflexion    Ankle plantarflexion    Ankle inversion    Ankle eversion     (Blank rows = not tested)  LOWER EXTREMITY MMT:  MMT Right eval Left eval  Hip flexion 4+/5 4+/5  Hip extension    Hip abduction    Hip adduction 4+/5 4+/5  Hip internal rotation 4+/5 4+/5 pain  Hip external rotation 4+/5 4+/5 pain  Knee flexion    Knee extension    Ankle dorsiflexion    Ankle plantarflexion    Ankle inversion    Ankle eversion     (Blank rows = not tested)  LOWER EXTREMITY SPECIAL TESTS:  Hip special tests: Luisa Hart (FABER) test: positive , Trendelenburg test: positive , SI compression test: positive , Piriformis test: positive , and straight leg raise negative  FUNCTIONAL TESTS:  5 times sit to  stand: 11.1 seconds  GAIT: Distance walked: 22ft Assistive device utilized: None Level of assistance: Complete Independence Comments: Antalgic, decreased right step length, lack of distinct heel strike and toe off  Stairs: Ascend and descend with reciprocal stair pattern with single upper extremity assist, decreased eccentric control and pain on left with descent     TODAY'S TREATMENT:  DATE: 8/7  Attempted figure 4 stretch painful  Exercises - Seated Quadratus Lumborum Stretch in Chair  - 1-2 x daily - 7 x weekly - 1 sets - 3 reps - 30 hold - Bridge with Posterior Pelvic Tilt  - 1-2 x daily - 7 x weekly - 2 sets - 10 reps - Supine Lower Trunk Rotation  - 1-2 x daily - 7 x weekly - 2 sets - 10 reps - 2 hold - Seated Lumbar Flexion Stretch  - 1 x daily - 7 x weekly - 1 sets - 10 reps - 10 hold    ATIENT EDUCATION:  Education details: MOI, diagnosis, prognosis, anatomy, exercise progression, DOMS expectations, muscle firing,  envelope of function, HEP, POC   Person educated: Patient Education method: Explanation, Demonstration, Tactile cues, Verbal cues, and Handouts Education comprehension: verbalized understanding, returned demonstration, verbal cues required, and tactile cues required  HOME EXERCISE PROGRAM:  Access Code: [email or web address] URL: [email or web address] Date: 04/16/2023 Prepared by: Zebedee Iba     ASSESSMENT:   CLINICAL IMPRESSION: Patient is a 73 y.o. male who was seen today for physical therapy evaluation and treatment for c/c of left hip pain. Pt's s/s appear consistent with left mechanical low back pain with concurrent posterior left hip pain.  Patient with report mild radicular symptoms. however,movement in clinic did not recreate hip pain.  With pt's pain is highly sensitive and irritable with movement.  However, does respond  well to manual therapy.  Patient had reported relief with pain with treatment.  Pt is more stiffness dominant at this time. Plan to continue with hip mobility/flexibility and progressive strength at future sessions.  Patient advised to continue with a walking program exercise at the gym with avoidance of heavy lifting and exercise that increases pain and discomfort at this time.  Patient advised on temporary offloading as therapy progresses gym-based exercise.  Pt would benefit from continued skilled therapy in order to reach goals and maximize functional LE strength and ROM for return to PLOF.    OBJECTIVE IMPAIRMENTS: Abnormal gait, decreased activity tolerance, decreased mobility, difficulty walking, decreased ROM, decreased strength, hypomobility, increased fascial restrictions, increased muscle spasms, impaired flexibility, improper body mechanics, postural dysfunction, and pain.    ACTIVITY LIMITATIONS: carrying, lifting, bending, sitting, standing, squatting, sleeping, stairs, and locomotion level   PARTICIPATION LIMITATIONS: cleaning, shopping, community activity, occupation, and yard work   PERSONAL FACTORS: Age, Fitness, behavior patten, Time since onset of injury/illness/exacerbation, and 1-2 comorbidities:    are also affecting patient's functional outcome.    REHAB POTENTIAL: Good   CLINICAL DECISION MAKING: Stable/uncomplicated   EVALUATION COMPLEXITY: Low     SHORT TERM GOALS: Target date:  05/28/2023        Pt will become independent with HEP in order to demonstrate synthesis of PT education.  Baseline: Goal status: INITIAL   2. Pt will be able to demonstrate transfers from sitting without pain in order to demonstrate functional improvement in LE function for self-care and house hold duties.   Goal status: INITIAL   3.  Pt will score at least 11 pt increase on FOTO to demonstrate functional improvement in MCII and pt perceived function.   Baseline:  Goal status:  INITIAL     LONG TERM GOALS: Target date: 07/15/2023          Pt  will become independent with final HEP in order to demonstrate synthesis of PT education.  Baseline:  Goal status: INITIAL   2.  Pt will be  able to demonstrate/report ability to sit/stand/sleep for extended periods of time without pain in order to demonstrate functional improvement and tolerance to static positioning.    Baseline:  Goal status: INITIAL   3.  Pt will score >/= 68 on FOTO to demonstrate improvement in perceived left hip function.  Baseline:  Goal status: INITIAL   4.  Pt will be able to demonstrate/report ability to walk >32mins without pain in order to demonstrate functional improvement and tolerance to exercise and community mobility.   Baseline:  Goal status: INITIAL     PLAN:   PT FREQUENCY: 1-2x/week   PT DURATION: 12 weeks (plan for D/C by 8 wks)   PLANNED INTERVENTIONS: Therapeutic exercises, Therapeutic activity, Neuromuscular re-education, Balance training, Gait training, Patient/Family education, Self Care, Joint mobilization, Joint manipulation, Orthotic/Fit training, DME instructions, Aquatic Therapy, Dry Needling, Electrical stimulation, Spinal manipulation, Spinal mobilization, Cryotherapy, Moist heat, scar mobilization, Splintting, Taping, Vasopneumatic device, Traction, Ultrasound, Ionotophoresis 4mg /ml Dexamethasone, Manual therapy, and Re-evaluation.   PLAN FOR NEXT SESSION: progressive hip strength and lumbar mobility; manual PRN; hip isometrics    Zebedee Iba, PT 04/16/2023, 2:30 PM

## 2023-04-25 NOTE — Progress Notes (Signed)
Carelink Summary Report / Loop Recorder 

## 2023-04-28 DIAGNOSIS — E669 Obesity, unspecified: Secondary | ICD-10-CM | POA: Diagnosis not present

## 2023-04-28 DIAGNOSIS — G4733 Obstructive sleep apnea (adult) (pediatric): Secondary | ICD-10-CM | POA: Diagnosis not present

## 2023-04-28 DIAGNOSIS — I10 Essential (primary) hypertension: Secondary | ICD-10-CM | POA: Diagnosis not present

## 2023-05-07 DIAGNOSIS — Z85820 Personal history of malignant melanoma of skin: Secondary | ICD-10-CM | POA: Diagnosis not present

## 2023-05-07 DIAGNOSIS — L281 Prurigo nodularis: Secondary | ICD-10-CM | POA: Diagnosis not present

## 2023-05-07 DIAGNOSIS — L578 Other skin changes due to chronic exposure to nonionizing radiation: Secondary | ICD-10-CM | POA: Diagnosis not present

## 2023-05-07 DIAGNOSIS — L821 Other seborrheic keratosis: Secondary | ICD-10-CM | POA: Diagnosis not present

## 2023-05-07 DIAGNOSIS — L570 Actinic keratosis: Secondary | ICD-10-CM | POA: Diagnosis not present

## 2023-05-07 DIAGNOSIS — B078 Other viral warts: Secondary | ICD-10-CM | POA: Diagnosis not present

## 2023-05-07 DIAGNOSIS — L738 Other specified follicular disorders: Secondary | ICD-10-CM | POA: Diagnosis not present

## 2023-05-07 DIAGNOSIS — Z86018 Personal history of other benign neoplasm: Secondary | ICD-10-CM | POA: Diagnosis not present

## 2023-05-07 DIAGNOSIS — Z85828 Personal history of other malignant neoplasm of skin: Secondary | ICD-10-CM | POA: Diagnosis not present

## 2023-05-07 DIAGNOSIS — Z872 Personal history of diseases of the skin and subcutaneous tissue: Secondary | ICD-10-CM | POA: Diagnosis not present

## 2023-05-09 DIAGNOSIS — R351 Nocturia: Secondary | ICD-10-CM | POA: Diagnosis not present

## 2023-05-09 DIAGNOSIS — R3911 Hesitancy of micturition: Secondary | ICD-10-CM | POA: Diagnosis not present

## 2023-05-09 DIAGNOSIS — N5201 Erectile dysfunction due to arterial insufficiency: Secondary | ICD-10-CM | POA: Diagnosis not present

## 2023-05-09 DIAGNOSIS — N401 Enlarged prostate with lower urinary tract symptoms: Secondary | ICD-10-CM | POA: Diagnosis not present

## 2023-05-10 ENCOUNTER — Other Ambulatory Visit: Payer: Self-pay | Admitting: Interventional Cardiology

## 2023-05-16 ENCOUNTER — Ambulatory Visit (HOSPITAL_BASED_OUTPATIENT_CLINIC_OR_DEPARTMENT_OTHER): Payer: PPO | Attending: Internal Medicine

## 2023-05-16 ENCOUNTER — Encounter (HOSPITAL_BASED_OUTPATIENT_CLINIC_OR_DEPARTMENT_OTHER): Payer: Self-pay

## 2023-05-16 DIAGNOSIS — M545 Low back pain, unspecified: Secondary | ICD-10-CM | POA: Insufficient documentation

## 2023-05-16 DIAGNOSIS — M6281 Muscle weakness (generalized): Secondary | ICD-10-CM | POA: Insufficient documentation

## 2023-05-16 DIAGNOSIS — M25552 Pain in left hip: Secondary | ICD-10-CM | POA: Diagnosis not present

## 2023-05-16 LAB — CUP PACEART REMOTE DEVICE CHECK
Date Time Interrogation Session: 20240905231000
Implantable Pulse Generator Implant Date: 20210322

## 2023-05-16 NOTE — Therapy (Signed)
OUTPATIENT PHYSICAL THERAPY LOWER EXTREMITY EVALUATION   Patient Name: Jerry Ruiz MRN: [id number] DOB:18-Apr-1951, 72 y.o., male Today's Date: 05/16/2023  END OF SESSION:  PT End of Session - 05/16/23 1333     Visit Number 2    Number of Visits 12    Date for PT Re-Evaluation 07/15/23    Authorization Type HTA    PT Start Time 1302    PT Stop Time 1345    PT Time Calculation (min) 43 min    Activity Tolerance Patient tolerated treatment well    Behavior During Therapy WFL for tasks assessed/performed              Past Medical History:  Diagnosis Date   Arthritis    Bifascicular block    pvc's   Bladder stone    Headache    History of echocardiogram    a. Echo 12/15: EF 55-60%, no RWMA, mild LAE, trivial MR   History of echocardiogram    Echo 12/17: mod conc LVH, EF 55-60, no RWMA, Gr 1 DD, mild AI, calcified AV leaflets, trivial MR, mod LAE, normal RVSF, mild TR, no pericardial eff   HTN (hypertension)    Pneumonia    PVC's (premature ventricular contractions)    Skin cancer    melanoma bil ears   Sleep apnea    cpap   Past Surgical History:  Procedure Laterality Date   HEMORROIDECTOMY     implantable loop recorder implantation  11/29/2019   Medtronic Reveal Linq model LNQ 22 ([place] [id number]) implanted by Dr Johney Frame for further evaluation of palpitations and arrhythmia management   INGUINAL HERNIA REPAIR Bilateral 03/28/2020   Procedure: LAPAROSCOPIC BILATERAL INGUINAL HERNIA REPAIR WITH MESH;  Surgeon: Kinsinger, De Blanch, MD;  Location: [facility];  Service: General;  Laterality: Bilateral;   KNEE SURGERY     arthroscopic bil   MOHS SURGERY     right big toe     bone removed   SHOULDER SURGERY     rotator right    TONSILLECTOMY     VASECTOMY     1987   Patient Active Problem List   Diagnosis Date Noted   Hyperlipidemia 08/05/2022   Wide-complex tachycardia 09/15/2019   Morbid obesity (HCC) 09/15/2019   OSA (obstructive sleep apnea) 09/15/2019    Palpitations 08/18/2019   SVT (supraventricular tachycardia)    PVC's (premature ventricular contractions) 05/20/2014   Other malaise and fatigue 05/20/2014   Mitral valve disorders(424.0) 08/11/2013   Nonspecific abnormal unspecified cardiovascular function study    HTN (hypertension)     PCP:  Emilio Aspen, MD     REFERRING PROVIDER:  Emilio Aspen, MD     REFERRING DIAG: [phone] (ICD-10-CM) - Pain in left hip   THERAPY DIAG:  Pain, lumbar region  Pain in left hip  Muscle weakness (generalized)  Rationale for Evaluation and Treatment: Rehabilitation  ONSET DATE: 3 months ago  SUBJECTIVE:   SUBJECTIVE STATEMENT:  No pain at rest. Recently started back at gym last week which has been going well so far. Pt is not walking as much, but now using bike for ~30 mins without issue. Reports non compliance with HEP.   Eval: Pt states he was going to the gym around 3 months ago about 4x/week. 10 mins walk, light machine, 30 mins walking. He was walking largely for cardio with a few UE machines. Very few LE machines. Pt started doing the back ext machine and felt pain 2 days after  that. Feels like a very sore joint and point to upper L hip. Doesn't hurt to sit or sleep. Transfers after sitting still hurt the most. The pain varies from day to day with some days being worse than others. R foot surgery in 90's and has not ever improved- has bunion on R. Pt denies NT. Pain stays within the upper hip, no pain past the knee. Pt denies cancer red flags. Denies crepitus in hip. Painful with dressing LE and crossing L LE. Stairs hurt going up and down. Feels pain is deep but can touch it. Denies feelings of LE weakness.   PERTINENT HISTORY: LBP,  PAIN:  Are you having pain? Yes: NPRS scale: 1/10 Pain location: L upper hip and down the leg Pain description: burning, sore,  Aggravating factors: standing up after sitting, stairs,  Relieving factors: laying down,  sleeping  PRECAUTIONS: Other: Loop recorder implant  RED FLAGS: None   WEIGHT BEARING RESTRICTIONS: No  FALLS:  Has patient fallen in last 6 months? No  LIVING ENVIRONMENT: Lives with: lives with their spouse Lives in: House/apartment Stairs:  Has following equipment at home: None  OCCUPATION: retired Curator  PLOF: Independent  PATIENT GOALS: reduce pain, return to exercise    OBJECTIVE:   DIAGNOSTIC FINDINGS:  FINDINGS: There is no evidence of hip fracture or dislocation. There is no evidence of arthropathy or other focal bone abnormality.   IMPRESSION: Negative.    PATIENT SURVEYS:  FOTO 54 68 @ DC 11 pts MCII  COGNITION: Overall cognitive status: Within functional limits for tasks assessed     SENSATION: WFL   MUSCLE LENGTH: Positive supine 90/90 HS Positive Ely's  POSTURE: decreased lumbar lordosis and increased thoracic kyphosis  PALPATION: Tenderness to palp patient to hypertonicity of left lumbar extensors as well as left glutes and hip rotators; joint extension stiffness on left lumbar L3-5  Lumbar active range of motion: Stiff and limited in all planes but does not recreate left hip pain  LOWER EXTREMITY ROM:  Passive ROM Right eval Left eval  Hip flexion 100 pain 100  Hip extension -5 -5  Hip abduction    Hip adduction    Hip internal rotation 20 15 p!  Hip external rotation 40  35 pain  Knee flexion    Knee extension    Ankle dorsiflexion    Ankle plantarflexion    Ankle inversion    Ankle eversion     (Blank rows = not tested)  LOWER EXTREMITY MMT:  MMT Right eval Left eval  Hip flexion 4+/5 4+/5  Hip extension    Hip abduction    Hip adduction 4+/5 4+/5  Hip internal rotation 4+/5 4+/5 pain  Hip external rotation 4+/5 4+/5 pain  Knee flexion    Knee extension    Ankle dorsiflexion    Ankle plantarflexion    Ankle inversion    Ankle eversion     (Blank rows = not tested)  LOWER EXTREMITY SPECIAL TESTS:   Hip special tests: Luisa Hart (FABER) test: positive , Trendelenburg test: positive , SI compression test: positive , Piriformis test: positive , and straight leg raise negative  FUNCTIONAL TESTS:  5 times sit to stand: 11.1 seconds  GAIT: Distance walked: 75ft Assistive device utilized: None Level of assistance: Complete Independence Comments: Antalgic, decreased right step length, lack of distinct heel strike and toe off  Stairs: Ascend and descend with reciprocal stair pattern with single upper extremity assist, decreased eccentric control and pain on left with descent  TODAY'S TREATMENT:                                                                                                                              DATE:   9/6: -LTR 5" x10ea -HSS seated- 30sec x2ea -Piriformis stretch- 30sec x3ea -Hooklying march with knees bent ~40deg 2x10 -dead bug 2x10 -seated lumbar flexion stretch 5" x10 with physioball -Seated on physioball partial sit up (external support from therapist) x10 -Resisted side stepping with slight squat x2 laps at rail- RTB at ankles -squats at rail 2x10 LF HS curl machine 50# 3x10   8/7  Attempted figure 4 stretch painful  Exercises - Seated Quadratus Lumborum Stretch in Chair  - 1-2 x daily - 7 x weekly - 1 sets - 3 reps - 30 hold - Bridge with Posterior Pelvic Tilt  - 1-2 x daily - 7 x weekly - 2 sets - 10 reps - Supine Lower Trunk Rotation  - 1-2 x daily - 7 x weekly - 2 sets - 10 reps - 2 hold - Seated Lumbar Flexion Stretch  - 1 x daily - 7 x weekly - 1 sets - 10 reps - 10 hold    ATIENT EDUCATION:  Education details: MOI, diagnosis, prognosis, anatomy, exercise progression, DOMS expectations, muscle firing,  envelope of function, HEP, POC   Person educated: Patient Education method: Explanation, Demonstration, Tactile cues, Verbal cues, and Handouts Education comprehension: verbalized understanding, returned demonstration, verbal cues  required, and tactile cues required  HOME EXERCISE PROGRAM:  Access Code: [email or web address] URL: [email or web address] Date: 04/16/2023 Prepared by: Zebedee Iba     ASSESSMENT:   CLINICAL IMPRESSION: Pt had good tolerance for stretching and strengthening program. Pt reported benefit from piriformis stretch and LTR. Tightness observed in HS with seated stretch. Weakness observed with squats and required cues to reduce range due to difficulty. Trialed partial sit ups on physioball as an alternative for crunch machine at gym. Reviewed gym machines he can use. Instructed him to avoid abdominal machines. Educated on appropriate intensity with exercise machines as pt reports he tends to over do it.   OBJECTIVE IMPAIRMENTS: Abnormal gait, decreased activity tolerance, decreased mobility, difficulty walking, decreased ROM, decreased strength, hypomobility, increased fascial restrictions, increased muscle spasms, impaired flexibility, improper body mechanics, postural dysfunction, and pain.    ACTIVITY LIMITATIONS: carrying, lifting, bending, sitting, standing, squatting, sleeping, stairs, and locomotion level   PARTICIPATION LIMITATIONS: cleaning, shopping, community activity, occupation, and yard work   PERSONAL FACTORS: Age, Fitness, behavior patten, Time since onset of injury/illness/exacerbation, and 1-2 comorbidities:    are also affecting patient's functional outcome.    REHAB POTENTIAL: Good   CLINICAL DECISION MAKING: Stable/uncomplicated   EVALUATION COMPLEXITY: Low     SHORT TERM GOALS: Target date:  05/28/2023        Pt will become independent with HEP in order to demonstrate synthesis of PT education.  Baseline: Goal status: IN PROGRESS 9/6   2. Pt  will be able to demonstrate transfers from sitting without pain in order to demonstrate functional improvement in LE function for self-care and house hold duties.   Goal status: INITIAL   3.  Pt will score at least 11 pt  increase on FOTO to demonstrate functional improvement in MCII and pt perceived function.   Baseline:  Goal status: INITIAL     LONG TERM GOALS: Target date: 07/15/2023          Pt  will become independent with final HEP in order to demonstrate synthesis of PT education.  Baseline:  Goal status: INITIAL   2.  Pt will be able to demonstrate/report ability to sit/stand/sleep for extended periods of time without pain in order to demonstrate functional improvement and tolerance to static positioning.    Baseline:  Goal status: INITIAL   3.  Pt will score >/= 68 on FOTO to demonstrate improvement in perceived left hip function.  Baseline:  Goal status: INITIAL   4.  Pt will be able to demonstrate/report ability to walk >22mins without pain in order to demonstrate functional improvement and tolerance to exercise and community mobility.   Baseline:  Goal status: INITIAL     PLAN:   PT FREQUENCY: 1-2x/week   PT DURATION: 12 weeks (plan for D/C by 8 wks)   PLANNED INTERVENTIONS: Therapeutic exercises, Therapeutic activity, Neuromuscular re-education, Balance training, Gait training, Patient/Family education, Self Care, Joint mobilization, Joint manipulation, Orthotic/Fit training, DME instructions, Aquatic Therapy, Dry Needling, Electrical stimulation, Spinal manipulation, Spinal mobilization, Cryotherapy, Moist heat, scar mobilization, Splintting, Taping, Vasopneumatic device, Traction, Ultrasound, Ionotophoresis 4mg /ml Dexamethasone, Manual therapy, and Re-evaluation.   PLAN FOR NEXT SESSION: progressive hip strength and lumbar mobility; manual PRN; hip isometrics    Jerry Ruiz, PTA 05/16/2023, 2:47 PM

## 2023-05-18 NOTE — Progress Notes (Unsigned)
Cardiology Office Note   Date:  05/18/2023   ID:  Jerry Ruiz, Jerry Ruiz [birth year], MRN [id number]  PCP:  Emilio Aspen, MD    No chief complaint on file.  SVT  Wt Readings from Last 3 Encounters:  08/20/22 233 lb (105.7 kg)  06/27/22 231 lb (104.8 kg)  08/17/21 233 lb 12.8 oz (106.1 kg)       History of Present Illness: Jerry Ruiz is a 72 y.o. male   with a hx of Pericardial cyst noted on prior CT scan in 2013, HTN. Echocardiograms have not demonstrated pericardial cyst. Last echo 12/15.    He has had PVCs in the past.   BPs have been difficult to control.  He was seen in the PharmD BP clinic back in 2017, but then went to PMD for a few years after that time.  He had trouble with Hyzaar, losartan: "He notes significant side effects to Hyzaar. He was fatigued and started to notice diffuse arthralgias. His arthralgias have not really improved. However, his fatigue has improved since stopping Hyzaar. (in 2017)"   He has had Diltiazem started recently in 06/2019, and increased to 180 mg daily.    He has had some episodes of PVCs.    Had SVT in 08/2019, referred to EP.   HTN plan was :"High in the hospital.  I think he will need more meds but he prefers to see the effects of CAP.  Continue irbesartan.  If BP remains  High after starting CPAP for OSA, then would increase irbesartan to 300 mg daily."   He changed Dilt to 180 daily and Irbesartan 150 mg daily.     He was started on Amio for his SVT.  Noted some tremors in the morning when he is using his left hand. No tremor at rest.  Only with fine movement.  Amiodarone was planned to be stopped 3 months after hernia surgery in 2021.     Back exercising in a gym. Stopped testosterone.  Started CPAP in late 2020 and BPs improved.    He has had some ringing in the ears.  Worse over the past few years.     He had COVID in 8/21.  He felt fatigue as he started to recover.  He lost taste and smell. He gained some weight.     Dose was decreased to 100 mg daily of Amio in late 2021.  He has had some tremor in late 2021.    EP stopped Amio in 10/2020: "Palpitations Well controlled PVC burden 0.1% SVT without recent recurrent He has severe LA enlargement but no afib history Stop amiodarone today Continue to follow with ILR   2. HTN Elevated Given bifascicular block and bradycardia, I would NOT favor further increases in diltiazem. If heart rates do not improve off of amiodarone I would advise reducing diltiazem to 120mg  daily and starting spironolactone for BP management"   Statin was recommended.  He does not want to take a statin.  His mother and father both had Parkinson's and he thinks they were related.    He had light fluffy particles on a blood test in the past with Dr. Shelva Majestic.    In 2022, we discussed Cialis and Viagra in the past but his wife did not want him to try.   Had some DOE when walking in [place] and [place].     Had basal cell cancer surgery in 8/23.  COuld not use CPAP.     "  Left ventricular ejection fraction, by estimation, is 60 to 65%. The  left ventricle has normal function. The left ventricle has no regional  wall motion abnormalities. There is moderate concentric left ventricular  hypertrophy. Left ventricular  diastolic parameters were normal.   2. Right ventricular systolic function is normal. The right ventricular  size is normal.   3. Left atrial size was mild to moderately dilated.   4. The mitral valve is normal in structure. Trivial mitral valve  regurgitation. No evidence of mitral stenosis.   5. The aortic valve is tricuspid. Aortic valve regurgitation is mild. No  aortic stenosis is present.   6. The inferior vena cava is normal in size with greater than 50%  respiratory variability, suggesting right atrial pressure of 3 mmHg. "   Noted in 2023: Balance is off. Tremor as well.   2023 PET/CT showed: "The study is normal. The study is low risk.   LV  perfusion is normal. There is no evidence of ischemia. There is no evidence of infarction.   Rest left ventricular function is normal. Rest EF: 54 %. Stress left ventricular function is normal. Stress EF: 67 %. End diastolic cavity size is mildly enlarged. End systolic cavity size is normal. No evidence of transient ischemic dilation (TID) noted."    Past Medical History:  Diagnosis Date   Arthritis    Bifascicular block    pvc's   Bladder stone    Headache    History of echocardiogram    a. Echo 12/15: EF 55-60%, no RWMA, mild LAE, trivial MR   History of echocardiogram    Echo 12/17: mod conc LVH, EF 55-60, no RWMA, Gr 1 DD, mild AI, calcified AV leaflets, trivial MR, mod LAE, normal RVSF, mild TR, no pericardial eff   HTN (hypertension)    Pneumonia    PVC's (premature ventricular contractions)    Skin cancer    melanoma bil ears   Sleep apnea    cpap    Past Surgical History:  Procedure Laterality Date   HEMORROIDECTOMY     implantable loop recorder implantation  11/29/2019   Medtronic Reveal Linq model LNQ 22 ([place] G) implanted by Dr Johney Frame for further evaluation of palpitations and arrhythmia management   INGUINAL HERNIA REPAIR Bilateral 03/28/2020   Procedure: LAPAROSCOPIC BILATERAL INGUINAL HERNIA REPAIR WITH MESH;  Surgeon: Kinsinger, De Blanch, MD;  Location: [facility];  Service: General;  Laterality: Bilateral;   KNEE SURGERY     arthroscopic bil   MOHS SURGERY     right big toe     bone removed   SHOULDER SURGERY     rotator right    TONSILLECTOMY     VASECTOMY     1987     Current Outpatient Medications  Medication Sig Dispense Refill   Cholecalciferol (VITAMIN D) 50 MCG (2000 UT) tablet Take 2,000 Units by mouth every other day.     diltiazem (DILACOR XR) 240 MG 24 hr capsule Take 1 tablet by mouth daily.     FISH OIL-COENZYME Q10 PO Take 1 capsule by mouth daily.      furosemide (LASIX) 20 MG tablet      ibuprofen (ADVIL) 800 MG tablet Take 1  tablet (800 mg total) by mouth every 8 (eight) hours as needed. 30 tablet 0   irbesartan (AVAPRO) 300 MG tablet TAKE 1 TABLET BY MOUTH EVERY DAY 90 tablet 0   L-ARGININE PO Take 1,000 mg by mouth daily.  metoprolol succinate (TOPROL-XL) 50 MG 24 hr tablet Take 25 mg by mouth daily. Take with or immediately following a meal.     Misc Natural Products (PROSTATE SUPPORT PO) Take 2 tablets by mouth daily. Prostate Pro otc supplement     Multiple Vitamin (MULTIVITAMIN) capsule Take 1 capsule by mouth daily.     Probiotic Product (PROBIOTIC DAILY PO) Take 1 tablet by mouth at bedtime.      spironolactone (ALDACTONE) 25 MG tablet Take 1 tablet (25 mg total) by mouth daily. 90 tablet 3   tadalafil (CIALIS) 5 MG tablet Take 5 mg by mouth daily.     No current facility-administered medications for this visit.    Allergies:   Carvedilol, Cephalexin, Losartan potassium, Amlodipine, and Sulfa antibiotics    Social History:  The patient  reports that he quit smoking about 51 years ago. His smoking use included cigarettes. He started smoking about 56 years ago. He has never used smokeless tobacco. He reports current alcohol use. He reports that he does not use drugs.   Family History:  The patient's ***family history includes Arrhythmia in his mother; Heart disease in his mother; Hypertension in his father and mother; Parkinson's disease in his father.    ROS:  Please see the history of present illness.   Otherwise, review of systems are positive for ***.   All other systems are reviewed and negative.    PHYSICAL EXAM: VS:  There were no vitals taken for this visit. , BMI There is no height or weight on file to calculate BMI. GEN: Well nourished, well developed, in no acute distress HEENT: normal Neck: no JVD, carotid bruits, or masses Cardiac: ***RRR; no murmurs, rubs, or gallops,no edema  Respiratory:  clear to auscultation bilaterally, normal work of breathing GI: soft, nontender, nondistended,  + BS MS: no deformity or atrophy Skin: warm and dry, no rash Neuro:  Strength and sensation are intact Psych: euthymic mood, full affect   EKG:   The ekg ordered today demonstrates ***   Recent Labs: No results found for requested labs within last 365 days.   Lipid Panel    Component Value Date/Time   CHOL 153 05/24/2014 0927   TRIG 89 05/24/2014 0927   HDL 30 (L) 05/24/2014 0927   CHOLHDL 5.1 05/24/2014 0927   VLDL 18 05/24/2014 0927   LDLCALC 105 (H) 05/24/2014 0927     Other studies Reviewed: Additional studies/ records that were reviewed today with results demonstrating: ***.   ASSESSMENT AND PLAN:  Coronary artery calcification: Negative PET/CT scan in 2023 showing no ischemia.  He thinks that statins can cause Parkinson's based on the experience of some of his relatives.  We have talked about referring him to the Pharm.D. lipid clinic to see if there are other options. Hypertension: SVT: Followed by EP. Pericardial cyst: Has decreased in size as of 2022. Obesity: Whole food, plant-based diet.  Exercise target noted below. PVCs: Followed by EP.  Low burden at last check. Prediabetes: We talked about increase fiber intake and exercise. Erectile dysfunction: He has tried L-arginine in the past.  Has declined Cialis or Viagra in the past as well because his wife did not want him to try. OSA: Had some improvement in blood pressure when he started CPAP in late 2020.   Current medicines are reviewed at length with the patient today.  The patient concerns regarding his medicines were addressed.  The following changes have been made:  No change***  Labs/ tests  ordered today include: *** No orders of the defined types were placed in this encounter.   Recommend 150 minutes/week of aerobic exercise Low fat, low carb, high fiber diet recommended  Disposition:   FU with Dr. Rennis Golden   Signed, Lance Muss, MD  05/18/2023 11:48 AM    [facility]  [facility] [address] [place], [place], [place]  [address] Phone: [phone]; Fax: [phone]

## 2023-05-19 ENCOUNTER — Ambulatory Visit (INDEPENDENT_AMBULATORY_CARE_PROVIDER_SITE_OTHER): Payer: PPO

## 2023-05-19 DIAGNOSIS — R002 Palpitations: Secondary | ICD-10-CM | POA: Diagnosis not present

## 2023-05-20 ENCOUNTER — Encounter: Payer: Self-pay | Admitting: Interventional Cardiology

## 2023-05-20 ENCOUNTER — Ambulatory Visit: Payer: PPO | Attending: Interventional Cardiology | Admitting: Interventional Cardiology

## 2023-05-20 VITALS — BP 134/84 | HR 90 | Ht 72.0 in | Wt 227.4 lb

## 2023-05-20 DIAGNOSIS — I471 Supraventricular tachycardia, unspecified: Secondary | ICD-10-CM | POA: Diagnosis not present

## 2023-05-20 DIAGNOSIS — I10 Essential (primary) hypertension: Secondary | ICD-10-CM

## 2023-05-20 DIAGNOSIS — E669 Obesity, unspecified: Secondary | ICD-10-CM

## 2023-05-20 DIAGNOSIS — I2510 Atherosclerotic heart disease of native coronary artery without angina pectoris: Secondary | ICD-10-CM | POA: Diagnosis not present

## 2023-05-20 DIAGNOSIS — Z79890 Hormone replacement therapy (postmenopausal): Secondary | ICD-10-CM | POA: Diagnosis not present

## 2023-05-20 DIAGNOSIS — I2584 Coronary atherosclerosis due to calcified coronary lesion: Secondary | ICD-10-CM | POA: Diagnosis not present

## 2023-05-20 DIAGNOSIS — I493 Ventricular premature depolarization: Secondary | ICD-10-CM

## 2023-05-20 DIAGNOSIS — E291 Testicular hypofunction: Secondary | ICD-10-CM | POA: Diagnosis not present

## 2023-05-20 NOTE — Patient Instructions (Signed)
Medication Instructions:  Your physician recommends that you continue on your current medications as directed. Please refer to the Current Medication list given to you today.  *If you need a refill on your cardiac medications before your next appointment, please call your pharmacy*   Lab Work: none If you have labs (blood work) drawn today and your tests are completely normal, you will receive your results only by: MyChart Message (if you have MyChart) OR A paper copy in the mail If you have any lab test that is abnormal or we need to change your treatment, we will call you to review the results.   Testing/Procedures: none   Follow-Up: At [facility], you and your health needs are our priority.  As part of our continuing mission to provide you with exceptional heart care, we have created designated Provider Care Teams.  These Care Teams include your primary Cardiologist (physician) and Advanced Practice Providers (APPs -  Physician Assistants and Nurse Practitioners) who all work together to provide you with the care you need, when you need it.  We recommend signing up for the patient portal called "MyChart".  Sign up information is provided on this After Visit Summary.  MyChart is used to connect with patients for Virtual Visits (Telemedicine).  Patients are able to view lab/test results, encounter notes, upcoming appointments, etc.  Non-urgent messages can be sent to your provider as well.   To learn more about what you can do with MyChart, go to [email or web address].    Your next appointment:   6 month(s)  Provider:   Dr Rennis Golden     Other Instructions

## 2023-05-23 ENCOUNTER — Ambulatory Visit (HOSPITAL_BASED_OUTPATIENT_CLINIC_OR_DEPARTMENT_OTHER): Payer: PPO

## 2023-05-23 ENCOUNTER — Encounter (HOSPITAL_BASED_OUTPATIENT_CLINIC_OR_DEPARTMENT_OTHER): Payer: Self-pay

## 2023-05-23 DIAGNOSIS — Z6831 Body mass index (BMI) 31.0-31.9, adult: Secondary | ICD-10-CM | POA: Diagnosis not present

## 2023-05-23 DIAGNOSIS — M6281 Muscle weakness (generalized): Secondary | ICD-10-CM

## 2023-05-23 DIAGNOSIS — M545 Low back pain, unspecified: Secondary | ICD-10-CM

## 2023-05-23 DIAGNOSIS — M2550 Pain in unspecified joint: Secondary | ICD-10-CM | POA: Diagnosis not present

## 2023-05-23 DIAGNOSIS — E291 Testicular hypofunction: Secondary | ICD-10-CM | POA: Diagnosis not present

## 2023-05-23 DIAGNOSIS — M25552 Pain in left hip: Secondary | ICD-10-CM

## 2023-05-23 DIAGNOSIS — R6882 Decreased libido: Secondary | ICD-10-CM | POA: Diagnosis not present

## 2023-05-23 NOTE — Therapy (Signed)
OUTPATIENT PHYSICAL THERAPY LOWER EXTREMITY TREATMENT   Patient Name: Jerry Ruiz MRN: [id number] DOB:1951/05/15, 72 y.o., male Today's Date: 05/23/2023  END OF SESSION:  PT End of Session - 05/23/23 1301     Visit Number 3    Number of Visits 12    Date for PT Re-Evaluation 07/15/23    Authorization Type HTA    PT Start Time 1302    PT Stop Time 1343    PT Time Calculation (min) 41 min    Activity Tolerance Patient tolerated treatment well    Behavior During Therapy WFL for tasks assessed/performed               Past Medical History:  Diagnosis Date   Arthritis    Bifascicular block    pvc's   Bladder stone    Headache    History of echocardiogram    a. Echo 12/15: EF 55-60%, no RWMA, mild LAE, trivial MR   History of echocardiogram    Echo 12/17: mod conc LVH, EF 55-60, no RWMA, Gr 1 DD, mild AI, calcified AV leaflets, trivial MR, mod LAE, normal RVSF, mild TR, no pericardial eff   HTN (hypertension)    Pneumonia    PVC's (premature ventricular contractions)    Skin cancer    melanoma bil ears   Sleep apnea    cpap   Past Surgical History:  Procedure Laterality Date   HEMORROIDECTOMY     implantable loop recorder implantation  11/29/2019   Medtronic Reveal Linq model LNQ 22 ([place] [id number]) implanted by Dr Johney Frame for further evaluation of palpitations and arrhythmia management   INGUINAL HERNIA REPAIR Bilateral 03/28/2020   Procedure: LAPAROSCOPIC BILATERAL INGUINAL HERNIA REPAIR WITH MESH;  Surgeon: Kinsinger, De Blanch, MD;  Location: [facility];  Service: General;  Laterality: Bilateral;   KNEE SURGERY     arthroscopic bil   MOHS SURGERY     right big toe     bone removed   SHOULDER SURGERY     rotator right    TONSILLECTOMY     VASECTOMY     1987   Patient Active Problem List   Diagnosis Date Noted   Hyperlipidemia 08/05/2022   Wide-complex tachycardia 09/15/2019   Morbid obesity (HCC) 09/15/2019   OSA (obstructive sleep apnea) 09/15/2019    Palpitations 08/18/2019   SVT (supraventricular tachycardia)    PVC's (premature ventricular contractions) 05/20/2014   Other malaise and fatigue 05/20/2014   Mitral valve disorders(424.0) 08/11/2013   Nonspecific abnormal unspecified cardiovascular function study    HTN (hypertension)     PCP:  Emilio Aspen, MD     REFERRING PROVIDER:  Emilio Aspen, MD     REFERRING DIAG: [phone] (ICD-10-CM) - Pain in left hip   THERAPY DIAG:  Pain, lumbar region  Pain in left hip  Muscle weakness (generalized)  Rationale for Evaluation and Treatment: Rehabilitation  ONSET DATE: 3 months ago  SUBJECTIVE:   SUBJECTIVE STATEMENT:  Pt reports he hurts his L knee probably from leg extension machine at the gym, but it's getting better. He states his back has been doing better.   Eval: Pt states he was going to the gym around 3 months ago about 4x/week. 10 mins walk, light machine, 30 mins walking. He was walking largely for cardio with a few UE machines. Very few LE machines. Pt started doing the back ext machine and felt pain 2 days after that. Feels like a very sore joint and point to  upper L hip. Doesn't hurt to sit or sleep. Transfers after sitting still hurt the most. The pain varies from day to day with some days being worse than others. R foot surgery in 90's and has not ever improved- has bunion on R. Pt denies NT. Pain stays within the upper hip, no pain past the knee. Pt denies cancer red flags. Denies crepitus in hip. Painful with dressing LE and crossing L LE. Stairs hurt going up and down. Feels pain is deep but can touch it. Denies feelings of LE weakness.   PERTINENT HISTORY: LBP,  PAIN:  Are you having pain? No  PRECAUTIONS: Other: Loop recorder implant  RED FLAGS: None   WEIGHT BEARING RESTRICTIONS: No  FALLS:  Has patient fallen in last 6 months? No  LIVING ENVIRONMENT: Lives with: lives with their spouse Lives in: House/apartment Stairs:   Has following equipment at home: None  OCCUPATION: retired Curator  PLOF: Independent  PATIENT GOALS: reduce pain, return to exercise    OBJECTIVE:   DIAGNOSTIC FINDINGS:  FINDINGS: There is no evidence of hip fracture or dislocation. There is no evidence of arthropathy or other focal bone abnormality.   IMPRESSION: Negative.    PATIENT SURVEYS:  FOTO 54 68 @ DC 11 pts MCII  COGNITION: Overall cognitive status: Within functional limits for tasks assessed     SENSATION: WFL   MUSCLE LENGTH: Positive supine 90/90 HS Positive Ely's  POSTURE: decreased lumbar lordosis and increased thoracic kyphosis  PALPATION: Tenderness to palp patient to hypertonicity of left lumbar extensors as well as left glutes and hip rotators; joint extension stiffness on left lumbar L3-5  Lumbar active range of motion: Stiff and limited in all planes but does not recreate left hip pain  LOWER EXTREMITY ROM:  Passive ROM Right eval Left eval  Hip flexion 100 pain 100  Hip extension -5 -5  Hip abduction    Hip adduction    Hip internal rotation 20 15 p!  Hip external rotation 40  35 pain  Knee flexion    Knee extension    Ankle dorsiflexion    Ankle plantarflexion    Ankle inversion    Ankle eversion     (Blank rows = not tested)  LOWER EXTREMITY MMT:  MMT Right eval Left eval  Hip flexion 4+/5 4+/5  Hip extension    Hip abduction    Hip adduction 4+/5 4+/5  Hip internal rotation 4+/5 4+/5 pain  Hip external rotation 4+/5 4+/5 pain  Knee flexion    Knee extension    Ankle dorsiflexion    Ankle plantarflexion    Ankle inversion    Ankle eversion     (Blank rows = not tested)  LOWER EXTREMITY SPECIAL TESTS:  Hip special tests: Luisa Hart (FABER) test: positive , Trendelenburg test: positive , SI compression test: positive , Piriformis test: positive , and straight leg raise negative  FUNCTIONAL TESTS:  5 times sit to stand: 11.1 seconds  GAIT: Distance walked:  33ft Assistive device utilized: None Level of assistance: Complete Independence Comments: Antalgic, decreased right step length, lack of distinct heel strike and toe off  Stairs: Ascend and descend with reciprocal stair pattern with single upper extremity assist, decreased eccentric control and pain on left with descent     TODAY'S TREATMENT:  DATE:   9/13: -LTR 5" x10ea -HSS seated- 30sec x2ea -Piriformis stretch- 30sec x3ea -supine 90/90 with alt LE extension -sidelying hip abduction 3x10ea -prone hip extension with knee flexed 3x10ea -squats at rail 2x10 -Partial deadlift at wall with 10lb KB 2x10    9/6: -LTR 5" x10ea -HSS seated- 30sec x2ea -Piriformis stretch- 30sec x3ea -Hooklying march with knees bent ~40deg 2x10 -dead bug 2x10 -seated lumbar flexion stretch 5" x10 with physioball -Seated on physioball partial sit up (external support from therapist) x10 -Resisted side stepping with slight squat x2 laps at rail- RTB at ankles -squats at rail 2x10 LF HS curl machine 50# 3x10   8/7  Attempted figure 4 stretch painful  Exercises - Seated Quadratus Lumborum Stretch in Chair  - 1-2 x daily - 7 x weekly - 1 sets - 3 reps - 30 hold - Bridge with Posterior Pelvic Tilt  - 1-2 x daily - 7 x weekly - 2 sets - 10 reps - Supine Lower Trunk Rotation  - 1-2 x daily - 7 x weekly - 2 sets - 10 reps - 2 hold - Seated Lumbar Flexion Stretch  - 1 x daily - 7 x weekly - 1 sets - 10 reps - 10 hold    ATIENT EDUCATION:  Education details: MOI, diagnosis, prognosis, anatomy, exercise progression, DOMS expectations, muscle firing,  envelope of function, HEP, POC   Person educated: Patient Education method: Explanation, Demonstration, Tactile cues, Verbal cues, and Handouts Education comprehension: verbalized understanding, returned demonstration, verbal cues  required, and tactile cues required  HOME EXERCISE PROGRAM:  Access Code: [email or web address] URL: [email or web address] Date: 04/16/2023 Prepared by: Zebedee Iba     ASSESSMENT:   CLINICAL IMPRESSION: Pt remains tight into bil hips with piriformis stretches. Progressed glute and core strengthening today with overall good tolerance, though challenging. He did have some low back discomfort with bridges initially, though this improved following cues to engage core and glutes and decrease range. Will continue to progress core and hip strengthening to improve pt's symptoms and prevent re injury as he resumes his gym activities.    OBJECTIVE IMPAIRMENTS: Abnormal gait, decreased activity tolerance, decreased mobility, difficulty walking, decreased ROM, decreased strength, hypomobility, increased fascial restrictions, increased muscle spasms, impaired flexibility, improper body mechanics, postural dysfunction, and pain.    ACTIVITY LIMITATIONS: carrying, lifting, bending, sitting, standing, squatting, sleeping, stairs, and locomotion level   PARTICIPATION LIMITATIONS: cleaning, shopping, community activity, occupation, and yard work   PERSONAL FACTORS: Age, Fitness, behavior patten, Time since onset of injury/illness/exacerbation, and 1-2 comorbidities:    are also affecting patient's functional outcome.    REHAB POTENTIAL: Good   CLINICAL DECISION MAKING: Stable/uncomplicated   EVALUATION COMPLEXITY: Low     SHORT TERM GOALS: Target date:  05/28/2023        Pt will become independent with HEP in order to demonstrate synthesis of PT education.  Baseline: Goal status: IN PROGRESS 9/6   2. Pt will be able to demonstrate transfers from sitting without pain in order to demonstrate functional improvement in LE function for self-care and house hold duties.   Goal status: INITIAL   3.  Pt will score at least 11 pt increase on FOTO to demonstrate functional improvement in MCII and pt  perceived function.   Baseline:  Goal status: INITIAL     LONG TERM GOALS: Target date: 07/15/2023          Pt  will become independent with final HEP in order to demonstrate synthesis of  PT education.  Baseline:  Goal status: INITIAL   2.  Pt will be able to demonstrate/report ability to sit/stand/sleep for extended periods of time without pain in order to demonstrate functional improvement and tolerance to static positioning.    Baseline:  Goal status: INITIAL   3.  Pt will score >/= 68 on FOTO to demonstrate improvement in perceived left hip function.  Baseline:  Goal status: INITIAL   4.  Pt will be able to demonstrate/report ability to walk >33mins without pain in order to demonstrate functional improvement and tolerance to exercise and community mobility.   Baseline:  Goal status: INITIAL     PLAN:   PT FREQUENCY: 1-2x/week   PT DURATION: 12 weeks (plan for D/C by 8 wks)   PLANNED INTERVENTIONS: Therapeutic exercises, Therapeutic activity, Neuromuscular re-education, Balance training, Gait training, Patient/Family education, Self Care, Joint mobilization, Joint manipulation, Orthotic/Fit training, DME instructions, Aquatic Therapy, Dry Needling, Electrical stimulation, Spinal manipulation, Spinal mobilization, Cryotherapy, Moist heat, scar mobilization, Splintting, Taping, Vasopneumatic device, Traction, Ultrasound, Ionotophoresis 4mg /ml Dexamethasone, Manual therapy, and Re-evaluation.   PLAN FOR NEXT SESSION: progressive hip strength and lumbar mobility; manual PRN; hip isometrics    Donnel Saxon Lucella Pommier, PTA 05/23/2023, 1:46 PM

## 2023-05-26 DIAGNOSIS — I700 Atherosclerosis of aorta: Secondary | ICD-10-CM | POA: Diagnosis not present

## 2023-05-26 DIAGNOSIS — Z79899 Other long term (current) drug therapy: Secondary | ICD-10-CM | POA: Diagnosis not present

## 2023-05-26 DIAGNOSIS — R251 Tremor, unspecified: Secondary | ICD-10-CM | POA: Diagnosis not present

## 2023-05-26 DIAGNOSIS — Z1331 Encounter for screening for depression: Secondary | ICD-10-CM | POA: Diagnosis not present

## 2023-05-26 DIAGNOSIS — E291 Testicular hypofunction: Secondary | ICD-10-CM | POA: Diagnosis not present

## 2023-05-26 DIAGNOSIS — R931 Abnormal findings on diagnostic imaging of heart and coronary circulation: Secondary | ICD-10-CM | POA: Diagnosis not present

## 2023-05-26 DIAGNOSIS — I493 Ventricular premature depolarization: Secondary | ICD-10-CM | POA: Diagnosis not present

## 2023-05-26 DIAGNOSIS — R350 Frequency of micturition: Secondary | ICD-10-CM | POA: Diagnosis not present

## 2023-05-26 DIAGNOSIS — E7800 Pure hypercholesterolemia, unspecified: Secondary | ICD-10-CM | POA: Diagnosis not present

## 2023-05-26 DIAGNOSIS — E669 Obesity, unspecified: Secondary | ICD-10-CM | POA: Diagnosis not present

## 2023-05-26 DIAGNOSIS — R7303 Prediabetes: Secondary | ICD-10-CM | POA: Diagnosis not present

## 2023-05-26 DIAGNOSIS — I10 Essential (primary) hypertension: Secondary | ICD-10-CM | POA: Diagnosis not present

## 2023-05-26 DIAGNOSIS — Z0000 Encounter for general adult medical examination without abnormal findings: Secondary | ICD-10-CM | POA: Diagnosis not present

## 2023-05-26 DIAGNOSIS — Z9989 Dependence on other enabling machines and devices: Secondary | ICD-10-CM | POA: Diagnosis not present

## 2023-05-28 ENCOUNTER — Ambulatory Visit (HOSPITAL_BASED_OUTPATIENT_CLINIC_OR_DEPARTMENT_OTHER): Payer: PPO | Admitting: Physical Therapy

## 2023-05-28 ENCOUNTER — Encounter (HOSPITAL_BASED_OUTPATIENT_CLINIC_OR_DEPARTMENT_OTHER): Payer: Self-pay | Admitting: Physical Therapy

## 2023-05-28 DIAGNOSIS — M545 Low back pain, unspecified: Secondary | ICD-10-CM

## 2023-05-28 DIAGNOSIS — M25552 Pain in left hip: Secondary | ICD-10-CM

## 2023-05-28 DIAGNOSIS — M6281 Muscle weakness (generalized): Secondary | ICD-10-CM

## 2023-05-28 NOTE — Therapy (Signed)
OUTPATIENT PHYSICAL THERAPY LOWER EXTREMITY TREATMENT   Patient Name: Jerry Ruiz MRN: [id number] DOB:1951/02/28, 72 y.o., male Today's Date: 05/28/2023  END OF SESSION:  PT End of Session - 05/28/23 1430     Visit Number 4    Number of Visits 12    Date for PT Re-Evaluation 07/15/23    Authorization Type HTA    PT Start Time 1402    PT Stop Time 1442    PT Time Calculation (min) 40 min    Activity Tolerance Patient tolerated treatment well    Behavior During Therapy [facility] for tasks assessed/performed                Past Medical History:  Diagnosis Date   Arthritis    Bifascicular block    pvc's   Bladder stone    Headache    History of echocardiogram    a. Echo 12/15: EF 55-60%, no RWMA, mild LAE, trivial MR   History of echocardiogram    Echo 12/17: mod conc LVH, EF 55-60, no RWMA, Gr 1 DD, mild AI, calcified AV leaflets, trivial MR, mod LAE, normal RVSF, mild TR, no pericardial eff   HTN (hypertension)    Pneumonia    PVC's (premature ventricular contractions)    Skin cancer    melanoma bil ears   Sleep apnea    cpap   Past Surgical History:  Procedure Laterality Date   HEMORROIDECTOMY     implantable loop recorder implantation  11/29/2019   Medtronic Reveal Linq model LNQ 22 ([place] G) implanted by Dr Johney Frame for further evaluation of palpitations and arrhythmia management   INGUINAL HERNIA REPAIR Bilateral 03/28/2020   Procedure: LAPAROSCOPIC BILATERAL INGUINAL HERNIA REPAIR WITH MESH;  Surgeon: Kinsinger, De Blanch, MD;  Location: [facility];  Service: General;  Laterality: Bilateral;   KNEE SURGERY     arthroscopic bil   MOHS SURGERY     right big toe     bone removed   SHOULDER SURGERY     rotator right    TONSILLECTOMY     VASECTOMY     1987   Patient Active Problem List   Diagnosis Date Noted   Hyperlipidemia 08/05/2022   Wide-complex tachycardia 09/15/2019   Morbid obesity (HCC) 09/15/2019   OSA (obstructive sleep apnea) 09/15/2019    Palpitations 08/18/2019   SVT (supraventricular tachycardia)    PVC's (premature ventricular contractions) 05/20/2014   Other malaise and fatigue 05/20/2014   Mitral valve disorders(424.0) 08/11/2013   Nonspecific abnormal unspecified cardiovascular function study    HTN (hypertension)     PCP:  Emilio Aspen, MD     REFERRING PROVIDER:  Emilio Aspen, MD     REFERRING DIAG: [phone] (ICD-10-CM) - Pain in left hip   THERAPY DIAG:  Pain, lumbar region  Pain in left hip  Muscle weakness (generalized)  Rationale for Evaluation and Treatment: Rehabilitation  ONSET DATE: 3 months ago  SUBJECTIVE:   SUBJECTIVE STATEMENT:  Pt states the back pain continues to improve. He only has pain with transitions that is up to 2-3/10 at most after sitting for too long.   Eval: Pt states he was going to the gym around 3 months ago about 4x/week. 10 mins walk, light machine, 30 mins walking. He was walking largely for cardio with a few UE machines. Very few LE machines. Pt started doing the back ext machine and felt pain 2 days after that. Feels like a very sore joint and point to  upper L hip. Doesn't hurt to sit or sleep. Transfers after sitting still hurt the most. The pain varies from day to day with some days being worse than others. R foot surgery in 90's and has not ever improved- has bunion on R. Pt denies NT. Pain stays within the upper hip, no pain past the knee. Pt denies cancer red flags. Denies crepitus in hip. Painful with dressing LE and crossing L LE. Stairs hurt going up and down. Feels pain is deep but can touch it. Denies feelings of LE weakness.   PERTINENT HISTORY: LBP,  PAIN:  Are you having pain? No  PRECAUTIONS: Other: Loop recorder implant  RED FLAGS: None   WEIGHT BEARING RESTRICTIONS: No  FALLS:  Has patient fallen in last 6 months? No  LIVING ENVIRONMENT: Lives with: lives with their spouse Lives in: House/apartment Stairs:  Has  following equipment at home: None  OCCUPATION: retired Curator  PLOF: Independent  PATIENT GOALS: reduce pain, return to exercise    OBJECTIVE:   DIAGNOSTIC FINDINGS:  FINDINGS: There is no evidence of hip fracture or dislocation. There is no evidence of arthropathy or other focal bone abnormality.   IMPRESSION: Negative.    PATIENT SURVEYS:  FOTO 54 68 @ DC 11 pts MCII  COGNITION: Overall cognitive status: Within functional limits for tasks assessed     SENSATION: WFL   MUSCLE LENGTH: Positive supine 90/90 HS Positive Ely's  POSTURE: decreased lumbar lordosis and increased thoracic kyphosis  PALPATION: Tenderness to palp patient to hypertonicity of left lumbar extensors as well as left glutes and hip rotators; joint extension stiffness on left lumbar L3-5  Lumbar active range of motion: Stiff and limited in all planes but does not recreate left hip pain  LOWER EXTREMITY ROM:  Passive ROM Right eval Left eval  Hip flexion 100 pain 100  Hip extension -5 -5  Hip abduction    Hip adduction    Hip internal rotation 20 15 p!  Hip external rotation 40  35 pain  Knee flexion    Knee extension    Ankle dorsiflexion    Ankle plantarflexion    Ankle inversion    Ankle eversion     (Blank rows = not tested)  LOWER EXTREMITY MMT:  MMT Right eval Left eval  Hip flexion 4+/5 4+/5  Hip extension    Hip abduction    Hip adduction 4+/5 4+/5  Hip internal rotation 4+/5 4+/5 pain  Hip external rotation 4+/5 4+/5 pain  Knee flexion    Knee extension    Ankle dorsiflexion    Ankle plantarflexion    Ankle inversion    Ankle eversion     (Blank rows = not tested)  LOWER EXTREMITY SPECIAL TESTS:  Hip special tests: Luisa Hart (FABER) test: positive , Trendelenburg test: positive , SI compression test: positive , Piriformis test: positive , and straight leg raise negative  FUNCTIONAL TESTS:  5 times sit to stand: 11.1 seconds  GAIT: Distance walked:  32ft Assistive device utilized: None Level of assistance: Complete Independence Comments: Antalgic, decreased right step length, lack of distinct heel strike and toe off  Stairs: Ascend and descend with reciprocal stair pattern with single upper extremity assist, decreased eccentric control and pain on left with descent     TODAY'S TREATMENT:  DATE:   9/18  Nustep warm up Lvl 5 6 min  L L3-5 UPA grade III   LTR 5s 10x each Child's pose 5s 10x -squats at rail 2x10 2s pause -weighted SB 5lb 2x10 each    9/13: -LTR 5" x10ea -HSS seated- 30sec x2ea -Piriformis stretch- 30sec x3ea -supine 90/90 with alt LE extension -sidelying hip abduction 3x10ea -prone hip extension with knee flexed 3x10ea -squats at rail 2x10 -Partial deadlift at wall with 10lb KB 2x10    9/6: -LTR 5" x10ea -HSS seated- 30sec x2ea -Piriformis stretch- 30sec x3ea -Hooklying march with knees bent ~40deg 2x10 -dead bug 2x10 -seated lumbar flexion stretch 5" x10 with physioball -Seated on physioball partial sit up (external support from therapist) x10 -Resisted side stepping with slight squat x2 laps at rail- RTB at ankles -squats at rail 2x10 LF HS curl machine 50# 3x10   8/7  Attempted figure 4 stretch painful  Exercises - Seated Quadratus Lumborum Stretch in Chair  - 1-2 x daily - 7 x weekly - 1 sets - 3 reps - 30 hold - Bridge with Posterior Pelvic Tilt  - 1-2 x daily - 7 x weekly - 2 sets - 10 reps - Supine Lower Trunk Rotation  - 1-2 x daily - 7 x weekly - 2 sets - 10 reps - 2 hold - Seated Lumbar Flexion Stretch  - 1 x daily - 7 x weekly - 1 sets - 10 reps - 10 hold    ATIENT EDUCATION:  Education details: MOI, diagnosis, prognosis, anatomy, exercise progression, DOMS expectations, muscle firing,  envelope of function, HEP, POC   Person educated:  Patient Education method: Explanation, Demonstration, Tactile cues, Verbal cues, and Handouts Education comprehension: verbalized understanding, returned demonstration, verbal cues required, and tactile cues required  HOME EXERCISE PROGRAM:  Access Code: [email or web address] URL: [email or web address] Date: 04/16/2023 Prepared by: Zebedee Iba     ASSESSMENT:   CLINICAL IMPRESSION: Pt presents with stiffness at beginning of session that was improved with lumbar mobilizations. Pt then able to follow up with loaded stretching and progressive lumbopelvic mobility exercise in weightbearing position without pain. Pt reports improvement in stiffness after session. Plan to continue with progressive, loaded exercise as tolerated. Pt advised that beginner level yoga is likely appropriate and beneficial for his current stiffness related deficits. Consider paloff and sidestepping at next session. Pt would benefit from continued skilled therapy in order to reach goals and maximize functional lumbopelvic strength and ROM for return to PLOF.    OBJECTIVE IMPAIRMENTS: Abnormal gait, decreased activity tolerance, decreased mobility, difficulty walking, decreased ROM, decreased strength, hypomobility, increased fascial restrictions, increased muscle spasms, impaired flexibility, improper body mechanics, postural dysfunction, and pain.    ACTIVITY LIMITATIONS: carrying, lifting, bending, sitting, standing, squatting, sleeping, stairs, and locomotion level   PARTICIPATION LIMITATIONS: cleaning, shopping, community activity, occupation, and yard work   PERSONAL FACTORS: Age, Fitness, behavior patten, Time since onset of injury/illness/exacerbation, and 1-2 comorbidities:    are also affecting patient's functional outcome.    REHAB POTENTIAL: Good   CLINICAL DECISION MAKING: Stable/uncomplicated   EVALUATION COMPLEXITY: Low     SHORT TERM GOALS: Target date:  05/28/2023        Pt will become  independent with HEP in order to demonstrate synthesis of PT education.  Baseline: Goal status: IN PROGRESS 9/6   2. Pt will be able to demonstrate transfers from sitting without pain in order to demonstrate functional improvement in LE function for self-care and house hold duties.  Goal status: INITIAL   3.  Pt will score at least 11 pt increase on FOTO to demonstrate functional improvement in MCII and pt perceived function.   Baseline:  Goal status: INITIAL     LONG TERM GOALS: Target date: 07/15/2023          Pt  will become independent with final HEP in order to demonstrate synthesis of PT education.  Baseline:  Goal status: INITIAL   2.  Pt will be able to demonstrate/report ability to sit/stand/sleep for extended periods of time without pain in order to demonstrate functional improvement and tolerance to static positioning.    Baseline:  Goal status: INITIAL   3.  Pt will score >/= 68 on FOTO to demonstrate improvement in perceived left hip function.  Baseline:  Goal status: INITIAL   4.  Pt will be able to demonstrate/report ability to walk >56mins without pain in order to demonstrate functional improvement and tolerance to exercise and community mobility.   Baseline:  Goal status: INITIAL     PLAN:   PT FREQUENCY: 1-2x/week   PT DURATION: 12 weeks (plan for D/C by 8 wks)   PLANNED INTERVENTIONS: Therapeutic exercises, Therapeutic activity, Neuromuscular re-education, Balance training, Gait training, Patient/Family education, Self Care, Joint mobilization, Joint manipulation, Orthotic/Fit training, DME instructions, Aquatic Therapy, Dry Needling, Electrical stimulation, Spinal manipulation, Spinal mobilization, Cryotherapy, Moist heat, scar mobilization, Splintting, Taping, Vasopneumatic device, Traction, Ultrasound, Ionotophoresis 4mg /ml Dexamethasone, Manual therapy, and Re-evaluation.   PLAN FOR NEXT SESSION: progressive hip strength and lumbar mobility; manual  PRN; hip isometrics    Zebedee Iba, PT 05/28/2023, 2:46 PM

## 2023-06-02 DIAGNOSIS — I10 Essential (primary) hypertension: Secondary | ICD-10-CM | POA: Diagnosis not present

## 2023-06-02 DIAGNOSIS — Z79899 Other long term (current) drug therapy: Secondary | ICD-10-CM | POA: Diagnosis not present

## 2023-06-02 DIAGNOSIS — R7303 Prediabetes: Secondary | ICD-10-CM | POA: Diagnosis not present

## 2023-06-02 DIAGNOSIS — E7800 Pure hypercholesterolemia, unspecified: Secondary | ICD-10-CM | POA: Diagnosis not present

## 2023-06-02 LAB — LAB REPORT - SCANNED
A1c: 6.2
Albumin, Urine POC: <0.7
Albumin/Creatinine Ratio, Urine, POC: <4.7
Creatinine, POC: 149 mg/dL
EGFR: 71

## 2023-06-04 NOTE — Progress Notes (Signed)
Carelink Summary Report / Loop Recorder 

## 2023-06-07 ENCOUNTER — Ambulatory Visit (HOSPITAL_BASED_OUTPATIENT_CLINIC_OR_DEPARTMENT_OTHER): Payer: PPO

## 2023-06-07 ENCOUNTER — Encounter (HOSPITAL_BASED_OUTPATIENT_CLINIC_OR_DEPARTMENT_OTHER): Payer: Self-pay

## 2023-06-07 DIAGNOSIS — M25552 Pain in left hip: Secondary | ICD-10-CM

## 2023-06-07 DIAGNOSIS — M6281 Muscle weakness (generalized): Secondary | ICD-10-CM

## 2023-06-07 DIAGNOSIS — M545 Low back pain, unspecified: Secondary | ICD-10-CM

## 2023-06-07 NOTE — Therapy (Signed)
OUTPATIENT PHYSICAL THERAPY LOWER EXTREMITY TREATMENT   Patient Name: Jerry Ruiz MRN: [id number] DOB:February 16, 1951, 72 y.o., male Today's Date: 06/07/2023  END OF SESSION:  PT End of Session - 06/07/23 0924     Visit Number 5    Number of Visits 12    Date for PT Re-Evaluation 07/15/23    Authorization Type HTA    PT Start Time 0920    PT Stop Time 1010    PT Time Calculation (min) 50 min    Activity Tolerance Patient tolerated treatment well    Behavior During Therapy [facility] for tasks assessed/performed                 Past Medical History:  Diagnosis Date   Arthritis    Bifascicular block    pvc's   Bladder stone    Headache    History of echocardiogram    a. Echo 12/15: EF 55-60%, no RWMA, mild LAE, trivial MR   History of echocardiogram    Echo 12/17: mod conc LVH, EF 55-60, no RWMA, Gr 1 DD, mild AI, calcified AV leaflets, trivial MR, mod LAE, normal RVSF, mild TR, no pericardial eff   HTN (hypertension)    Pneumonia    PVC's (premature ventricular contractions)    Skin cancer    melanoma bil ears   Sleep apnea    cpap   Past Surgical History:  Procedure Laterality Date   HEMORROIDECTOMY     implantable loop recorder implantation  11/29/2019   Medtronic Reveal Linq model LNQ 22 ([place] G) implanted by Dr Johney Frame for further evaluation of palpitations and arrhythmia management   INGUINAL HERNIA REPAIR Bilateral 03/28/2020   Procedure: LAPAROSCOPIC BILATERAL INGUINAL HERNIA REPAIR WITH MESH;  Surgeon: Kinsinger, De Blanch, MD;  Location: [facility];  Service: General;  Laterality: Bilateral;   KNEE SURGERY     arthroscopic bil   MOHS SURGERY     right big toe     bone removed   SHOULDER SURGERY     rotator right    TONSILLECTOMY     VASECTOMY     1987   Patient Active Problem List   Diagnosis Date Noted   Hyperlipidemia 08/05/2022   Wide-complex tachycardia 09/15/2019   Morbid obesity (HCC) 09/15/2019   OSA (obstructive sleep apnea) 09/15/2019    Palpitations 08/18/2019   SVT (supraventricular tachycardia)    PVC's (premature ventricular contractions) 05/20/2014   Other malaise and fatigue 05/20/2014   Mitral valve disorders(424.0) 08/11/2013   Nonspecific abnormal unspecified cardiovascular function study    HTN (hypertension)     PCP:  Emilio Aspen, MD     REFERRING PROVIDER:  Emilio Aspen, MD     REFERRING DIAG: [phone] (ICD-10-CM) - Pain in left hip   THERAPY DIAG:  Pain, lumbar region  Pain in left hip  Muscle weakness (generalized)  Rationale for Evaluation and Treatment: Rehabilitation  ONSET DATE: 3 months ago  SUBJECTIVE:   SUBJECTIVE STATEMENT:  Pt states the back pain continues to improve. He only has pain with transitions that is up to 2-3/10 at most after sitting for too long.   Eval: Pt states he was going to the gym around 3 months ago about 4x/week. 10 mins walk, light machine, 30 mins walking. He was walking largely for cardio with a few UE machines. Very few LE machines. Pt started doing the back ext machine and felt pain 2 days after that. Feels like a very sore joint and point  to upper L hip. Doesn't hurt to sit or sleep. Transfers after sitting still hurt the most. The pain varies from day to day with some days being worse than others. R foot surgery in 90's and has not ever improved- has bunion on R. Pt denies NT. Pain stays within the upper hip, no pain past the knee. Pt denies cancer red flags. Denies crepitus in hip. Painful with dressing LE and crossing L LE. Stairs hurt going up and down. Feels pain is deep but can touch it. Denies feelings of LE weakness.   PERTINENT HISTORY: LBP,  PAIN:  Are you having pain? No  PRECAUTIONS: Other: Loop recorder implant  RED FLAGS: None   WEIGHT BEARING RESTRICTIONS: No  FALLS:  Has patient fallen in last 6 months? No  LIVING ENVIRONMENT: Lives with: lives with their spouse Lives in: House/apartment Stairs:  Has  following equipment at home: None  OCCUPATION: retired Curator  PLOF: Independent  PATIENT GOALS: reduce pain, return to exercise    OBJECTIVE:   DIAGNOSTIC FINDINGS:  FINDINGS: There is no evidence of hip fracture or dislocation. There is no evidence of arthropathy or other focal bone abnormality.   IMPRESSION: Negative.    PATIENT SURVEYS:  FOTO 54 68 @ DC 11 pts MCII  COGNITION: Overall cognitive status: Within functional limits for tasks assessed     SENSATION: WFL   MUSCLE LENGTH: Positive supine 90/90 HS Positive Ely's  POSTURE: decreased lumbar lordosis and increased thoracic kyphosis  PALPATION: Tenderness to palp patient to hypertonicity of left lumbar extensors as well as left glutes and hip rotators; joint extension stiffness on left lumbar L3-5  Lumbar active range of motion: Stiff and limited in all planes but does not recreate left hip pain  LOWER EXTREMITY ROM:  Passive ROM Right eval Left eval  Hip flexion 100 pain 100  Hip extension -5 -5  Hip abduction    Hip adduction    Hip internal rotation 20 15 p!  Hip external rotation 40  35 pain  Knee flexion    Knee extension    Ankle dorsiflexion    Ankle plantarflexion    Ankle inversion    Ankle eversion     (Blank rows = not tested)  LOWER EXTREMITY MMT:  MMT Right eval Left eval  Hip flexion 4+/5 4+/5  Hip extension    Hip abduction    Hip adduction 4+/5 4+/5  Hip internal rotation 4+/5 4+/5 pain  Hip external rotation 4+/5 4+/5 pain  Knee flexion    Knee extension    Ankle dorsiflexion    Ankle plantarflexion    Ankle inversion    Ankle eversion     (Blank rows = not tested)  LOWER EXTREMITY SPECIAL TESTS:  Hip special tests: Luisa Hart (FABER) test: positive , Trendelenburg test: positive , SI compression test: positive , Piriformis test: positive , and straight leg raise negative  FUNCTIONAL TESTS:  5 times sit to stand: 11.1 seconds  GAIT: Distance walked:  70ft Assistive device utilized: None Level of assistance: Complete Independence Comments: Antalgic, decreased right step length, lack of distinct heel strike and toe off  Stairs: Ascend and descend with reciprocal stair pattern with single upper extremity assist, decreased eccentric control and pain on left with descent     TODAY'S TREATMENT:  DATE:   9/28  Nustep warm up Lvl 5 6 min  -LTR 5" x10ea -HSS supine with strap- 30sec x2ea -Piriformis stretch- 30sec x3ea -supine 90/90 with alt LE extension -sidelying hip abduction 2x10ea 2# -prone hip extension with knee flexed 3x10ea 2# -Partial deadlift at wall with 10lb KB x15 Palloff press at cable column 7.5lbs 2x10ea (monitor shoulder discomfort) -weighted SB 10lb 2x10 each  9/18  Nustep warm up Lvl 5 6 min  L L3-5 UPA grade III   LTR 5s 10x each Child's pose 5s 10x -squats at rail 2x10 2s pause -weighted SB 5lb 2x10 each    9/13: -LTR 5" x10ea -HSS seated- 30sec x2ea -Piriformis stretch- 30sec x3ea -supine 90/90 with alt LE extension -sidelying hip abduction 3x10ea -prone hip extension with knee flexed 3x10ea -squats at rail 2x10 -Partial deadlift at wall with 10lb KB 2x10    9/6: -LTR 5" x10ea -HSS seated- 30sec x2ea -Piriformis stretch- 30sec x3ea -Hooklying march with knees bent ~40deg 2x10 -dead bug 2x10 -seated lumbar flexion stretch 5" x10 with physioball -Seated on physioball partial sit up (external support from therapist) x10 -Resisted side stepping with slight squat x2 laps at rail- RTB at ankles -squats at rail 2x10 LF HS curl machine 50# 3x10   8/7  Attempted figure 4 stretch painful  Exercises - Seated Quadratus Lumborum Stretch in Chair  - 1-2 x daily - 7 x weekly - 1 sets - 3 reps - 30 hold - Bridge with Posterior Pelvic Tilt  - 1-2 x daily - 7 x weekly - 2  sets - 10 reps - Supine Lower Trunk Rotation  - 1-2 x daily - 7 x weekly - 2 sets - 10 reps - 2 hold - Seated Lumbar Flexion Stretch  - 1 x daily - 7 x weekly - 1 sets - 10 reps - 10 hold    ATIENT EDUCATION:  Education details: MOI, diagnosis, prognosis, anatomy, exercise progression, DOMS expectations, muscle firing,  envelope of function, HEP, POC   Person educated: Patient Education method: Explanation, Demonstration, Tactile cues, Verbal cues, and Handouts Education comprehension: verbalized understanding, returned demonstration, verbal cues required, and tactile cues required  HOME EXERCISE PROGRAM:  Access Code: [email or web address] URL: [email or web address] Date: 04/16/2023 Prepared by: Zebedee Iba     ASSESSMENT:   CLINICAL IMPRESSION: Trialled palloff press for core strengthening with good tolerance, though patient does have bilateral shoulder issues (worse on left) so monitored for shoulder discomfort throughout this exercise.. Able to progress with resistance on hip strengthening with good tolerance.  Patient does require cues with then lift technique for proper form.  No complaints of back discomfort with exercise.  Patient to continue with child's pose stretch at home for lumbar stretching.  Will continue to monitor low back pain and tightness.  Patient is interested in trying beginner yoga class [employer] and plans to look into cost times this weekend.  Will follow-up on this next visit.    OBJECTIVE IMPAIRMENTS: Abnormal gait, decreased activity tolerance, decreased mobility, difficulty walking, decreased ROM, decreased strength, hypomobility, increased fascial restrictions, increased muscle spasms, impaired flexibility, improper body mechanics, postural dysfunction, and pain.    ACTIVITY LIMITATIONS: carrying, lifting, bending, sitting, standing, squatting, sleeping, stairs, and locomotion level   PARTICIPATION LIMITATIONS: cleaning, shopping, community activity,  occupation, and yard work   PERSONAL FACTORS: Age, Fitness, behavior patten, Time since onset of injury/illness/exacerbation, and 1-2 comorbidities:    are also affecting patient's functional outcome.    REHAB POTENTIAL: Good  CLINICAL DECISION MAKING: Stable/uncomplicated   EVALUATION COMPLEXITY: Low     SHORT TERM GOALS: Target date:  05/28/2023        Pt will become independent with HEP in order to demonstrate synthesis of PT education.  Baseline: Goal status: IN PROGRESS 9/6   2. Pt will be able to demonstrate transfers from sitting without pain in order to demonstrate functional improvement in LE function for self-care and house hold duties.   Goal status: INITIAL   3.  Pt will score at least 11 pt increase on FOTO to demonstrate functional improvement in MCII and pt perceived function.   Baseline:  Goal status: INITIAL     LONG TERM GOALS: Target date: 07/15/2023          Pt  will become independent with final HEP in order to demonstrate synthesis of PT education.  Baseline:  Goal status: INITIAL   2.  Pt will be able to demonstrate/report ability to sit/stand/sleep for extended periods of time without pain in order to demonstrate functional improvement and tolerance to static positioning.    Baseline:  Goal status: INITIAL   3.  Pt will score >/= 68 on FOTO to demonstrate improvement in perceived left hip function.  Baseline:  Goal status: INITIAL   4.  Pt will be able to demonstrate/report ability to walk >77mins without pain in order to demonstrate functional improvement and tolerance to exercise and community mobility.   Baseline:  Goal status: INITIAL     PLAN:   PT FREQUENCY: 1-2x/week   PT DURATION: 12 weeks (plan for D/C by 8 wks)   PLANNED INTERVENTIONS: Therapeutic exercises, Therapeutic activity, Neuromuscular re-education, Balance training, Gait training, Patient/Family education, Self Care, Joint mobilization, Joint manipulation,  Orthotic/Fit training, DME instructions, Aquatic Therapy, Dry Needling, Electrical stimulation, Spinal manipulation, Spinal mobilization, Cryotherapy, Moist heat, scar mobilization, Splintting, Taping, Vasopneumatic device, Traction, Ultrasound, Ionotophoresis 4mg /ml Dexamethasone, Manual therapy, and Re-evaluation.   PLAN FOR NEXT SESSION: progressive hip strength and lumbar mobility; manual PRN; hip isometrics    Donnel Saxon Winfred Redel, PTA 06/07/2023, 10:17 AM

## 2023-06-13 ENCOUNTER — Encounter (HOSPITAL_BASED_OUTPATIENT_CLINIC_OR_DEPARTMENT_OTHER): Payer: Self-pay

## 2023-06-13 ENCOUNTER — Ambulatory Visit (HOSPITAL_BASED_OUTPATIENT_CLINIC_OR_DEPARTMENT_OTHER): Payer: PPO | Attending: Internal Medicine

## 2023-06-13 DIAGNOSIS — M6281 Muscle weakness (generalized): Secondary | ICD-10-CM | POA: Insufficient documentation

## 2023-06-13 DIAGNOSIS — M545 Low back pain, unspecified: Secondary | ICD-10-CM | POA: Insufficient documentation

## 2023-06-13 DIAGNOSIS — M25552 Pain in left hip: Secondary | ICD-10-CM | POA: Insufficient documentation

## 2023-06-13 NOTE — Therapy (Signed)
OUTPATIENT PHYSICAL THERAPY LOWER EXTREMITY TREATMENT   Patient Name: Jerry Ruiz MRN: [id number] DOB:[birth year], 72 y.o., male Today's Date: 06/13/2023  END OF SESSION:  PT End of Session - 06/13/23 1530     Visit Number 6    Number of Visits 12    Date for PT Re-Evaluation 07/15/23    Authorization Type HTA    PT Start Time 1519    PT Stop Time 1600    PT Time Calculation (min) 41 min    Activity Tolerance Patient tolerated treatment well    Behavior During Therapy WFL for tasks assessed/performed                  Past Medical History:  Diagnosis Date   Arthritis    Bifascicular block    pvc's   Bladder stone    Headache    History of echocardiogram    a. Echo 12/15: EF 55-60%, no RWMA, mild LAE, trivial MR   History of echocardiogram    Echo 12/17: mod conc LVH, EF 55-60, no RWMA, Gr 1 DD, mild AI, calcified AV leaflets, trivial MR, mod LAE, normal RVSF, mild TR, no pericardial eff   HTN (hypertension)    Pneumonia    PVC's (premature ventricular contractions)    Skin cancer    melanoma bil ears   Sleep apnea    cpap   Past Surgical History:  Procedure Laterality Date   HEMORROIDECTOMY     implantable loop recorder implantation  11/29/2019   Medtronic Reveal Linq model LNQ 22 ([place] [id number]) implanted by Dr Johney Frame for further evaluation of palpitations and arrhythmia management   INGUINAL HERNIA REPAIR Bilateral 03/28/2020   Procedure: LAPAROSCOPIC BILATERAL INGUINAL HERNIA REPAIR WITH MESH;  Surgeon: Kinsinger, De Blanch, MD;  Location: [facility];  Service: General;  Laterality: Bilateral;   KNEE SURGERY     arthroscopic bil   MOHS SURGERY     right big toe     bone removed   SHOULDER SURGERY     rotator right    TONSILLECTOMY     VASECTOMY     1987   Patient Active Problem List   Diagnosis Date Noted   Hyperlipidemia 08/05/2022   Wide-complex tachycardia 09/15/2019   Morbid obesity (HCC) 09/15/2019   OSA (obstructive sleep apnea) 09/15/2019    Palpitations 08/18/2019   SVT (supraventricular tachycardia) (HCC)    PVC's (premature ventricular contractions) 05/20/2014   Other malaise and fatigue 05/20/2014   Mitral valve disease 08/11/2013   Nonspecific abnormal results of cardiovascular function study    HTN (hypertension)     PCP:  Emilio Aspen, MD     REFERRING PROVIDER:  Emilio Aspen, MD     REFERRING DIAG: [phone] (ICD-10-CM) - Pain in left hip   THERAPY DIAG:  Pain, lumbar region  Pain in left hip  Muscle weakness (generalized)  Rationale for Evaluation and Treatment: Rehabilitation  ONSET DATE: 3 months ago  SUBJECTIVE:   SUBJECTIVE STATEMENT:  Pt reports no pain today, but he did tweak his back at the gym a little so it hurt last night. Unsure what caused this.   Eval: Pt states he was going to the gym around 3 months ago about 4x/week. 10 mins walk, light machine, 30 mins walking. He was walking largely for cardio with a few UE machines. Very few LE machines. Pt started doing the back ext machine and felt pain 2 days after that. Feels like a very sore joint  and point to upper L hip. Doesn't hurt to sit or sleep. Transfers after sitting still hurt the most. The pain varies from day to day with some days being worse than others. R foot surgery in 90's and has not ever improved- has bunion on R. Pt denies NT. Pain stays within the upper hip, no pain past the knee. Pt denies cancer red flags. Denies crepitus in hip. Painful with dressing LE and crossing L LE. Stairs hurt going up and down. Feels pain is deep but can touch it. Denies feelings of LE weakness.   PERTINENT HISTORY: LBP,  PAIN:  Are you having pain? No  PRECAUTIONS: Other: Loop recorder implant  RED FLAGS: None   WEIGHT BEARING RESTRICTIONS: No  FALLS:  Has patient fallen in last 6 months? No  LIVING ENVIRONMENT: Lives with: lives with their spouse Lives in: House/apartment Stairs:  Has following equipment at  home: None  OCCUPATION: retired Curator  PLOF: Independent  PATIENT GOALS: reduce pain, return to exercise    OBJECTIVE:   DIAGNOSTIC FINDINGS:  FINDINGS: There is no evidence of hip fracture or dislocation. There is no evidence of arthropathy or other focal bone abnormality.   IMPRESSION: Negative.    PATIENT SURVEYS:  FOTO 54 68 @ DC 11 pts MCII FOTO 10/4: 67%  COGNITION: Overall cognitive status: Within functional limits for tasks assessed     SENSATION: WFL   MUSCLE LENGTH: Positive supine 90/90 HS Positive Ely's  POSTURE: decreased lumbar lordosis and increased thoracic kyphosis  PALPATION: Tenderness to palp patient to hypertonicity of left lumbar extensors as well as left glutes and hip rotators; joint extension stiffness on left lumbar L3-5  Lumbar active range of motion: Stiff and limited in all planes but does not recreate left hip pain  LOWER EXTREMITY ROM:  Passive ROM Right eval Left eval  Hip flexion 100 pain 100  Hip extension -5 -5  Hip abduction    Hip adduction    Hip internal rotation 20 15 p!  Hip external rotation 40  35 pain  Knee flexion    Knee extension    Ankle dorsiflexion    Ankle plantarflexion    Ankle inversion    Ankle eversion     (Blank rows = not tested)  LOWER EXTREMITY MMT:  MMT Right eval Left eval  Hip flexion 4+/5 4+/5  Hip extension    Hip abduction    Hip adduction 4+/5 4+/5  Hip internal rotation 4+/5 4+/5 pain  Hip external rotation 4+/5 4+/5 pain  Knee flexion    Knee extension    Ankle dorsiflexion    Ankle plantarflexion    Ankle inversion    Ankle eversion     (Blank rows = not tested)  LOWER EXTREMITY SPECIAL TESTS:  Hip special tests: Luisa Hart (FABER) test: positive , Trendelenburg test: positive , SI compression test: positive , Piriformis test: positive , and straight leg raise negative  FUNCTIONAL TESTS:  5 times sit to stand: 11.1 seconds  GAIT: Distance walked:  5ft Assistive device utilized: None Level of assistance: Complete Independence Comments: Antalgic, decreased right step length, lack of distinct heel strike and toe off  Stairs: Ascend and descend with reciprocal stair pattern with single upper extremity assist, decreased eccentric control and pain on left with descent     TODAY'S TREATMENT:  DATE:   10/4 FOTO Nustep warm up Lvl 5 6 min  -LTR 5" x10ea -HSS supine with strap- 30sec x3ea -Piriformis stretch- 30sec x3ea -supine 90/90 with alt LE extension 2x10ea -sidelying hip abduction 2x10ea 2# -prone hip extension with knee flexed 3x10ea 2#    9/28  Nustep warm up Lvl 5 6 min  -LTR 5" x10ea -HSS supine with strap- 30sec x2ea -Piriformis stretch- 30sec x3ea -supine 90/90 with alt LE extension -sidelying hip abduction 2x10ea 2# -prone hip extension with knee flexed 3x10ea 2# -Partial deadlift at wall with 10lb KB x15 Palloff press at cable column 7.5lbs 2x10ea (monitor shoulder discomfort) -weighted SB 10lb 2x10 each  9/18  Nustep warm up Lvl 5 6 min  L L3-5 UPA grade III   LTR 5s 10x each Child's pose 5s 10x -squats at rail 2x10 2s pause -weighted SB 5lb 2x10 each    9/13: -LTR 5" x10ea -HSS seated- 30sec x2ea -Piriformis stretch- 30sec x3ea -supine 90/90 with alt LE extension -sidelying hip abduction 3x10ea -prone hip extension with knee flexed 3x10ea -squats at rail 2x10 -Partial deadlift at wall with 10lb KB 2x10    9/6: -LTR 5" x10ea -HSS seated- 30sec x2ea -Piriformis stretch- 30sec x3ea -Hooklying march with knees bent ~40deg 2x10 -dead bug 2x10 -seated lumbar flexion stretch 5" x10 with physioball -Seated on physioball partial sit up (external support from therapist) x10 -Resisted side stepping with slight squat x2 laps at rail- RTB at ankles -squats at rail  2x10 LF HS curl machine 50# 3x10   8/7  Attempted figure 4 stretch painful  Exercises - Seated Quadratus Lumborum Stretch in Chair  - 1-2 x daily - 7 x weekly - 1 sets - 3 reps - 30 hold - Bridge with Posterior Pelvic Tilt  - 1-2 x daily - 7 x weekly - 2 sets - 10 reps - Supine Lower Trunk Rotation  - 1-2 x daily - 7 x weekly - 2 sets - 10 reps - 2 hold - Seated Lumbar Flexion Stretch  - 1 x daily - 7 x weekly - 1 sets - 10 reps - 10 hold    ATIENT EDUCATION:  Education details: MOI, diagnosis, prognosis, anatomy, exercise progression, DOMS expectations, muscle firing,  envelope of function, HEP, POC   Person educated: Patient Education method: Explanation, Demonstration, Tactile cues, Verbal cues, and Handouts Education comprehension: verbalized understanding, returned demonstration, verbal cues required, and tactile cues required  HOME EXERCISE PROGRAM:  Access Code: [email or web address] URL: [email or web address] Date: 04/16/2023 Prepared by: Zebedee Iba     ASSESSMENT:   CLINICAL IMPRESSION: Pt one point off from FOTO goal. He continued with good tolerance for progressive strengthening of core and hips as well as lumbar stretching.     OBJECTIVE IMPAIRMENTS: Abnormal gait, decreased activity tolerance, decreased mobility, difficulty walking, decreased ROM, decreased strength, hypomobility, increased fascial restrictions, increased muscle spasms, impaired flexibility, improper body mechanics, postural dysfunction, and pain.    ACTIVITY LIMITATIONS: carrying, lifting, bending, sitting, standing, squatting, sleeping, stairs, and locomotion level   PARTICIPATION LIMITATIONS: cleaning, shopping, community activity, occupation, and yard work   PERSONAL FACTORS: Age, Fitness, behavior patten, Time since onset of injury/illness/exacerbation, and 1-2 comorbidities:    are also affecting patient's functional outcome.    REHAB POTENTIAL: Good   CLINICAL DECISION MAKING:  Stable/uncomplicated   EVALUATION COMPLEXITY: Low     SHORT TERM GOALS: Target date:  05/28/2023        Pt will become independent with HEP in order to  demonstrate synthesis of PT education.  Baseline: Goal status: IN PROGRESS 9/6   2. Pt will be able to demonstrate transfers from sitting without pain in order to demonstrate functional improvement in LE function for self-care and house hold duties.   Goal status:Some stiffness but no pain- MET 10/4   3.  Pt will score at least 11 pt increase on FOTO to demonstrate functional improvement in MCII and pt perceived function.   Baseline:  Goal status: MET 10/4     LONG TERM GOALS: Target date: 07/15/2023          Pt  will become independent with final HEP in order to demonstrate synthesis of PT education.  Baseline:  Goal status: INITIAL   2.  Pt will be able to demonstrate/report ability to sit/stand/sleep for extended periods of time without pain in order to demonstrate functional improvement and tolerance to static positioning.    Baseline:  Goal status: INITIAL   3.  Pt will score >/= 68 on FOTO to demonstrate improvement in perceived left hip function.  Baseline:  Goal status: IN PROGRESS 10/4   4.  Pt will be able to demonstrate/report ability to walk >85mins without pain in order to demonstrate functional improvement and tolerance to exercise and community mobility.   Baseline:  Goal status: INITIAL     PLAN:   PT FREQUENCY: 1-2x/week   PT DURATION: 12 weeks (plan for D/C by 8 wks)   PLANNED INTERVENTIONS: Therapeutic exercises, Therapeutic activity, Neuromuscular re-education, Balance training, Gait training, Patient/Family education, Self Care, Joint mobilization, Joint manipulation, Orthotic/Fit training, DME instructions, Aquatic Therapy, Dry Needling, Electrical stimulation, Spinal manipulation, Spinal mobilization, Cryotherapy, Moist heat, scar mobilization, Splintting, Taping, Vasopneumatic device, Traction,  Ultrasound, Ionotophoresis 4mg /ml Dexamethasone, Manual therapy, and Re-evaluation.   PLAN FOR NEXT SESSION: progressive hip strength and lumbar mobility; manual PRN; hip isometrics    Donnel Saxon Shankar Silber, PTA 06/13/2023, 4:59 PM

## 2023-06-20 ENCOUNTER — Ambulatory Visit (HOSPITAL_BASED_OUTPATIENT_CLINIC_OR_DEPARTMENT_OTHER): Payer: PPO

## 2023-06-20 ENCOUNTER — Encounter (HOSPITAL_BASED_OUTPATIENT_CLINIC_OR_DEPARTMENT_OTHER): Payer: Self-pay

## 2023-06-20 DIAGNOSIS — M25552 Pain in left hip: Secondary | ICD-10-CM

## 2023-06-20 DIAGNOSIS — M545 Low back pain, unspecified: Secondary | ICD-10-CM

## 2023-06-20 DIAGNOSIS — M6281 Muscle weakness (generalized): Secondary | ICD-10-CM

## 2023-06-20 NOTE — Therapy (Signed)
OUTPATIENT PHYSICAL THERAPY LOWER EXTREMITY TREATMENT   Patient Name: Jerry Ruiz MRN: [id number] DOB:[birth year], 72 y.o., male Today's Date: 06/20/2023  END OF SESSION:  PT End of Session - 06/20/23 1531     Visit Number 7    Number of Visits 12    Date for PT Re-Evaluation 07/15/23    Authorization Type HTA    PT Start Time 1519    PT Stop Time 1601    PT Time Calculation (min) 42 min    Activity Tolerance Patient tolerated treatment well    Behavior During Therapy WFL for tasks assessed/performed                   Past Medical History:  Diagnosis Date   Arthritis    Bifascicular block    pvc's   Bladder stone    Headache    History of echocardiogram    a. Echo 12/15: EF 55-60%, no RWMA, mild LAE, trivial MR   History of echocardiogram    Echo 12/17: mod conc LVH, EF 55-60, no RWMA, Gr 1 DD, mild AI, calcified AV leaflets, trivial MR, mod LAE, normal RVSF, mild TR, no pericardial eff   HTN (hypertension)    Pneumonia    PVC's (premature ventricular contractions)    Skin cancer    melanoma bil ears   Sleep apnea    cpap   Past Surgical History:  Procedure Laterality Date   HEMORROIDECTOMY     implantable loop recorder implantation  11/29/2019   Medtronic Reveal Linq model LNQ 22 ([place] [id number]) implanted by Dr Johney Frame for further evaluation of palpitations and arrhythmia management   INGUINAL HERNIA REPAIR Bilateral 03/28/2020   Procedure: LAPAROSCOPIC BILATERAL INGUINAL HERNIA REPAIR WITH MESH;  Surgeon: Kinsinger, De Blanch, MD;  Location: [facility];  Service: General;  Laterality: Bilateral;   KNEE SURGERY     arthroscopic bil   MOHS SURGERY     right big toe     bone removed   SHOULDER SURGERY     rotator right    TONSILLECTOMY     VASECTOMY     1987   Patient Active Problem List   Diagnosis Date Noted   Hyperlipidemia 08/05/2022   Wide-complex tachycardia 09/15/2019   Morbid obesity (HCC) 09/15/2019   OSA (obstructive sleep apnea)  09/15/2019   Palpitations 08/18/2019   SVT (supraventricular tachycardia) (HCC)    PVC's (premature ventricular contractions) 05/20/2014   Other malaise and fatigue 05/20/2014   Mitral valve disease 08/11/2013   Nonspecific abnormal results of cardiovascular function study    HTN (hypertension)     PCP:  Emilio Aspen, MD     REFERRING PROVIDER:  Emilio Aspen, MD     REFERRING DIAG: [phone] (ICD-10-CM) - Pain in left hip   THERAPY DIAG:  Pain, lumbar region  Pain in left hip  Muscle weakness (generalized)  Rationale for Evaluation and Treatment: Rehabilitation  ONSET DATE: 3 months ago  SUBJECTIVE:   SUBJECTIVE STATEMENT:  Pt reports no pain today, but he did tweak his back at the gym a little so it hurt last night. Unsure what caused this.   Eval: Pt states he was going to the gym around 3 months ago about 4x/week. 10 mins walk, light machine, 30 mins walking. He was walking largely for cardio with a few UE machines. Very few LE machines. Pt started doing the back ext machine and felt pain 2 days after that. Feels like a very sore  joint and point to upper L hip. Doesn't hurt to sit or sleep. Transfers after sitting still hurt the most. The pain varies from day to day with some days being worse than others. R foot surgery in 90's and has not ever improved- has bunion on R. Pt denies NT. Pain stays within the upper hip, no pain past the knee. Pt denies cancer red flags. Denies crepitus in hip. Painful with dressing LE and crossing L LE. Stairs hurt going up and down. Feels pain is deep but can touch it. Denies feelings of LE weakness.   PERTINENT HISTORY: LBP,  PAIN:  Are you having pain? No  PRECAUTIONS: Other: Loop recorder implant  RED FLAGS: None   WEIGHT BEARING RESTRICTIONS: No  FALLS:  Has patient fallen in last 6 months? No  LIVING ENVIRONMENT: Lives with: lives with their spouse Lives in: House/apartment Stairs:  Has following  equipment at home: None  OCCUPATION: retired Curator  PLOF: Independent  PATIENT GOALS: reduce pain, return to exercise    OBJECTIVE:   DIAGNOSTIC FINDINGS:  FINDINGS: There is no evidence of hip fracture or dislocation. There is no evidence of arthropathy or other focal bone abnormality.   IMPRESSION: Negative.    PATIENT SURVEYS:  FOTO 54 68 @ DC 11 pts MCII FOTO 10/4: 67%  COGNITION: Overall cognitive status: Within functional limits for tasks assessed     SENSATION: WFL   MUSCLE LENGTH: Positive supine 90/90 HS Positive Ely's  POSTURE: decreased lumbar lordosis and increased thoracic kyphosis  PALPATION: Tenderness to palp patient to hypertonicity of left lumbar extensors as well as left glutes and hip rotators; joint extension stiffness on left lumbar L3-5  Lumbar active range of motion: Stiff and limited in all planes but does not recreate left hip pain  LOWER EXTREMITY ROM:  Passive ROM Right eval Left eval  Hip flexion 100 pain 100  Hip extension -5 -5  Hip abduction    Hip adduction    Hip internal rotation 20 15 p!  Hip external rotation 40  35 pain  Knee flexion    Knee extension    Ankle dorsiflexion    Ankle plantarflexion    Ankle inversion    Ankle eversion     (Blank rows = not tested)  LOWER EXTREMITY MMT:  MMT Right eval Left eval  Hip flexion 4+/5 4+/5  Hip extension    Hip abduction    Hip adduction 4+/5 4+/5  Hip internal rotation 4+/5 4+/5 pain  Hip external rotation 4+/5 4+/5 pain  Knee flexion    Knee extension    Ankle dorsiflexion    Ankle plantarflexion    Ankle inversion    Ankle eversion     (Blank rows = not tested)  LOWER EXTREMITY SPECIAL TESTS:  Hip special tests: Luisa Hart (FABER) test: positive , Trendelenburg test: positive , SI compression test: positive , Piriformis test: positive , and straight leg raise negative  FUNCTIONAL TESTS:  5 times sit to stand: 11.1 seconds  GAIT: Distance walked:  57ft Assistive device utilized: None Level of assistance: Complete Independence Comments: Antalgic, decreased right step length, lack of distinct heel strike and toe off  Stairs: Ascend and descend with reciprocal stair pattern with single upper extremity assist, decreased eccentric control and pain on left with descent     TODAY'S TREATMENT:  DATE:   10/11  Nustep warm up Lvl 5 6 min  -LTR 5"  x10ea -HSS supine with strap- 30sec x3ea -Piriformis stretch- 30sec x3ea -supine 90/90 with alt LE extension 2x10ea -sidelying hip abduction 2x10ea 2# -prone hip extension with knee flexed 2x15ea 2#   10/4 FOTO Nustep warm up Lvl 5 6 min  -LTR 5" x10ea -HSS supine with strap- 30sec x3ea -Piriformis stretch- 30sec x3ea -supine 90/90 with alt LE extension 2x10ea -sidelying hip abduction 2x10ea 2# -prone hip extension with knee flexed 3x10ea 2#    9/28  Nustep warm up Lvl 5 6 min  -LTR 5" x10ea -HSS supine with strap- 30sec x2ea -Piriformis stretch- 30sec x3ea -supine 90/90 with alt LE extension -sidelying hip abduction 2x10ea 2# -prone hip extension with knee flexed 3x10ea 2# -Partial deadlift at wall with 10lb KB x15 Palloff press at cable column 7.5lbs 2x10ea (monitor shoulder discomfort) -weighted SB 10lb 2x10 each    ATIENT EDUCATION:  Education details: MOI, diagnosis, prognosis, anatomy, exercise progression, DOMS expectations, muscle firing,  envelope of function, HEP, POC   Person educated: Patient Education method: Explanation, Demonstration, Tactile cues, Verbal cues, and Handouts Education comprehension: verbalized understanding, returned demonstration, verbal cues required, and tactile cues required  HOME EXERCISE PROGRAM:  Access Code: [email or web address] URL: [email or web address] Date: 04/16/2023 Prepared by: Zebedee Iba      ASSESSMENT:   CLINICAL IMPRESSION: Pt continues to benefit from stretching and strengthening for lumbosacral region. He denied LBP with activity, though does fatigue in core and hip musculature. Pt will benefit from a few more sessions of PT to work on core strength and for pt to trial HEP at home, which he states he has not been doing.    OBJECTIVE IMPAIRMENTS: Abnormal gait, decreased activity tolerance, decreased mobility, difficulty walking, decreased ROM, decreased strength, hypomobility, increased fascial restrictions, increased muscle spasms, impaired flexibility, improper body mechanics, postural dysfunction, and pain.    ACTIVITY LIMITATIONS: carrying, lifting, bending, sitting, standing, squatting, sleeping, stairs, and locomotion level   PARTICIPATION LIMITATIONS: cleaning, shopping, community activity, occupation, and yard work   PERSONAL FACTORS: Age, Fitness, behavior patten, Time since onset of injury/illness/exacerbation, and 1-2 comorbidities:    are also affecting patient's functional outcome.    REHAB POTENTIAL: Good   CLINICAL DECISION MAKING: Stable/uncomplicated   EVALUATION COMPLEXITY: Low     SHORT TERM GOALS: Target date:  05/28/2023        Pt will become independent with HEP in order to demonstrate synthesis of PT education.  Baseline: Goal status: IN PROGRESS 9/6   2. Pt will be able to demonstrate transfers from sitting without pain in order to demonstrate functional improvement in LE function for self-care and house hold duties.   Goal status:Some stiffness but no pain- MET 10/4   3.  Pt will score at least 11 pt increase on FOTO to demonstrate functional improvement in MCII and pt perceived function.   Baseline:  Goal status: MET 10/4     LONG TERM GOALS: Target date: 07/15/2023          Pt  will become independent with final HEP in order to demonstrate synthesis of PT education.  Baseline:  Goal status: INITIAL   2.  Pt will be able to  demonstrate/report ability to sit/stand/sleep for extended periods of time without pain in order to demonstrate functional improvement and tolerance to static positioning.    Baseline:  Goal status: INITIAL   3.  Pt will score >/= 68 on FOTO to  demonstrate improvement in perceived left hip function.  Baseline:  Goal status: IN PROGRESS 10/4   4.  Pt will be able to demonstrate/report ability to walk >25mins without pain in order to demonstrate functional improvement and tolerance to exercise and community mobility.   Baseline:  Goal status: INITIAL     PLAN:   PT FREQUENCY: 1-2x/week   PT DURATION: 12 weeks (plan for D/C by 8 wks)   PLANNED INTERVENTIONS: Therapeutic exercises, Therapeutic activity, Neuromuscular re-education, Balance training, Gait training, Patient/Family education, Self Care, Joint mobilization, Joint manipulation, Orthotic/Fit training, DME instructions, Aquatic Therapy, Dry Needling, Electrical stimulation, Spinal manipulation, Spinal mobilization, Cryotherapy, Moist heat, scar mobilization, Splintting, Taping, Vasopneumatic device, Traction, Ultrasound, Ionotophoresis 4mg /ml Dexamethasone, Manual therapy, and Re-evaluation.   PLAN FOR NEXT SESSION: progressive hip strength and lumbar mobility; manual PRN; hip isometrics    Donnel Saxon Shaeley Segall, PTA 06/20/2023, 4:17 PM

## 2023-06-23 ENCOUNTER — Ambulatory Visit (HOSPITAL_BASED_OUTPATIENT_CLINIC_OR_DEPARTMENT_OTHER): Payer: PPO

## 2023-06-23 ENCOUNTER — Encounter (HOSPITAL_BASED_OUTPATIENT_CLINIC_OR_DEPARTMENT_OTHER): Payer: Self-pay

## 2023-06-23 ENCOUNTER — Ambulatory Visit (INDEPENDENT_AMBULATORY_CARE_PROVIDER_SITE_OTHER): Payer: PPO

## 2023-06-23 DIAGNOSIS — R002 Palpitations: Secondary | ICD-10-CM | POA: Diagnosis not present

## 2023-06-23 DIAGNOSIS — M545 Low back pain, unspecified: Secondary | ICD-10-CM | POA: Diagnosis not present

## 2023-06-23 DIAGNOSIS — M6281 Muscle weakness (generalized): Secondary | ICD-10-CM

## 2023-06-23 DIAGNOSIS — M25552 Pain in left hip: Secondary | ICD-10-CM

## 2023-06-23 NOTE — Therapy (Signed)
OUTPATIENT PHYSICAL THERAPY LOWER EXTREMITY TREATMENT  PHYSICAL THERAPY DISCHARGE SUMMARY  Visits from Start of Care: 8  Plan: Patient agrees to discharge.  Patient goals largely met. Patient is being discharged due to meeting the stated rehab goals.         Patient Name: Jerry Ruiz MRN: [id number] DOB:05-02-1951, 72 y.o., male Today's Date: 06/23/2023  END OF SESSION:  PT End of Session - 06/23/23 1413     Visit Number 8    Number of Visits 12    Date for PT Re-Evaluation 07/15/23    Authorization Type HTA    PT Start Time 1303    PT Stop Time 1352    PT Time Calculation (min) 49 min    Activity Tolerance Patient tolerated treatment well    Behavior During Therapy [facility] for tasks assessed/performed                    Past Medical History:  Diagnosis Date   Arthritis    Bifascicular block    pvc's   Bladder stone    Headache    History of echocardiogram    a. Echo 12/15: EF 55-60%, no RWMA, mild LAE, trivial MR   History of echocardiogram    Echo 12/17: mod conc LVH, EF 55-60, no RWMA, Gr 1 DD, mild AI, calcified AV leaflets, trivial MR, mod LAE, normal RVSF, mild TR, no pericardial eff   HTN (hypertension)    Pneumonia    PVC's (premature ventricular contractions)    Skin cancer    melanoma bil ears   Sleep apnea    cpap   Past Surgical History:  Procedure Laterality Date   HEMORROIDECTOMY     implantable loop recorder implantation  11/29/2019   Medtronic Reveal Linq model LNQ 22 ([place] [id number]) implanted by Dr Johney Frame for further evaluation of palpitations and arrhythmia management   INGUINAL HERNIA REPAIR Bilateral 03/28/2020   Procedure: LAPAROSCOPIC BILATERAL INGUINAL HERNIA REPAIR WITH MESH;  Surgeon: Kinsinger, De Blanch, MD;  Location: [facility];  Service: General;  Laterality: Bilateral;   KNEE SURGERY     arthroscopic bil   MOHS SURGERY     right big toe     bone removed   SHOULDER SURGERY     rotator right    TONSILLECTOMY      VASECTOMY     1987   Patient Active Problem List   Diagnosis Date Noted   Hyperlipidemia 08/05/2022   Wide-complex tachycardia 09/15/2019   Morbid obesity (HCC) 09/15/2019   OSA (obstructive sleep apnea) 09/15/2019   Palpitations 08/18/2019   SVT (supraventricular tachycardia) (HCC)    PVC's (premature ventricular contractions) 05/20/2014   Other malaise and fatigue 05/20/2014   Mitral valve disease 08/11/2013   Nonspecific abnormal results of cardiovascular function study    HTN (hypertension)     PCP:  Emilio Aspen, MD     REFERRING PROVIDER:  Emilio Aspen, MD     REFERRING DIAG: [phone] (ICD-10-CM) - Pain in left hip   THERAPY DIAG:  Pain in left hip  Muscle weakness (generalized)  Pain, lumbar region  Rationale for Evaluation and Treatment: Rehabilitation  ONSET DATE: 3 months ago  SUBJECTIVE:   SUBJECTIVE STATEMENT:  Pt reports improved pain level since starting PT. Has not completed HEP. "I did do some more workouts at the gym."  PERTINENT HISTORY: LBP, scoliosis PAIN:  Are you having pain? No  PRECAUTIONS: Other: Loop recorder implant  RED FLAGS: None  WEIGHT BEARING RESTRICTIONS: No  FALLS:  Has patient fallen in last 6 months? No  LIVING ENVIRONMENT: Lives with: lives with their spouse Lives in: House/apartment Stairs:  Has following equipment at home: None  OCCUPATION: retired Curator  PLOF: Independent  PATIENT GOALS: reduce pain, return to exercise    OBJECTIVE:   DIAGNOSTIC FINDINGS:  FINDINGS: There is no evidence of hip fracture or dislocation. There is no evidence of arthropathy or other focal bone abnormality.   IMPRESSION: Negative.    PATIENT SURVEYS:  FOTO 54 68 @ DC 11 pts MCII FOTO 10/4: 67% FOTO 10/14: 67%  COGNITION: Overall cognitive status: Within functional limits for tasks assessed     SENSATION: WFL   MUSCLE LENGTH: Positive supine 90/90 HS Positive Ely's  POSTURE:  decreased lumbar lordosis and increased thoracic kyphosis  Lumbar active range of motion: Stiff and limited in all planes but does not recreate left hip pain  LOWER EXTREMITY ROM:  Passive ROM Right eval Left eval Right 10/14 Left  10/14  Hip flexion 100 pain 100 103 102  Hip extension -5 -5 15 12   Hip abduction      Hip adduction      Hip internal rotation 20 15 p! 36 31!  Hip external rotation 40  35 pain 30 35  Knee flexion      Knee extension      Ankle dorsiflexion      Ankle plantarflexion      Ankle inversion      Ankle eversion       (Blank rows = not tested)  LOWER EXTREMITY MMT:  MMT Right eval Left eval Right 10/14 Left  10/14  Hip flexion 4+/5 4+/5 4+/5 4+/5  Hip extension      Hip abduction      Hip adduction 4+/5 4+/5 5/5 5/5  Hip internal rotation 4+/5 4+/5 pain 4+/5 5/5  Hip external rotation 4+/5 4+/5 pain 5/5 5/5  Knee flexion      Knee extension      Ankle dorsiflexion      Ankle plantarflexion      Ankle inversion      Ankle eversion       (Blank rows = not tested)    FUNCTIONAL TESTS:  5 times sit to stand: 11.1 seconds 5xSTS 10/14: 11.0 seconds  GAIT: Distance walked: 37ft Assistive device utilized: None Level of assistance: Complete Independence Comments: Lack of heel strike, Right trunk lean      TODAY'S TREATMENT:                                                                                                                              DATE:   10/14 FOTO 67% Nustep warm up Lvl 5 5 min -Updated ROM, MMT, goals Gait observation  -LTR 5"  x10ea -HSS supine with strap- 30sec x3ea -Piriformis stretch- 30sec x3ea -supine 90/90 with alt LE extension 2x10ea -sidelying hip abduction 2x10ea 2# -  prone hip extension with knee flexed 2x15ea 2#    ATIENT EDUCATION:  Education details: MOI, diagnosis, prognosis, anatomy, exercise progression, DOMS expectations, muscle firing,  envelope of function, HEP, POC   Person  educated: Patient Education method: Explanation, Demonstration, Tactile cues, Verbal cues, and Handouts Education comprehension: verbalized understanding, returned demonstration, verbal cues required, and tactile cues required  HOME EXERCISE PROGRAM:  Access Code: [email or web address] URL: [email or web address] Date: 04/16/2023 Prepared by: Zebedee Iba     ASSESSMENT:   CLINICAL IMPRESSION: Pt has attended 8 visits of PT thus far and has made excellent progress towards goals. He has met 2/3 STG and 3/4 LTG. Pt one point shy of FOTO goal. Has not met initial HEP goal due to non-compliance. He denies difficulty or pain with ADLS. Updated measurements reflect improved strength and ROM in bilateral hips. With gait, pt does continue to ambulate with short step lengths and decreased heel contact. He also demonstrates a trunk lean to the right, likely a result of his scoliosis. Pt's gait improved significantly following cues for increased step length and heel contact. Pt again educated on importance of HEP performance. Provided updated HEP for pt to work on this as well as written cues for improving gait. Will DC at this time.      OBJECTIVE IMPAIRMENTS: Abnormal gait, decreased activity tolerance, decreased mobility, difficulty walking, decreased ROM, decreased strength, hypomobility, increased fascial restrictions, increased muscle spasms, impaired flexibility, improper body mechanics, postural dysfunction, and pain.    ACTIVITY LIMITATIONS: carrying, lifting, bending, sitting, standing, squatting, sleeping, stairs, and locomotion level   PARTICIPATION LIMITATIONS: cleaning, shopping, community activity, occupation, and yard work   PERSONAL FACTORS: Age, Fitness, behavior patten, Time since onset of injury/illness/exacerbation, and 1-2 comorbidities:    are also affecting patient's functional outcome.    REHAB POTENTIAL: Good   CLINICAL DECISION MAKING: Stable/uncomplicated   EVALUATION  COMPLEXITY: Low     SHORT TERM GOALS: Target date:  05/28/2023        Pt will become independent with HEP in order to demonstrate synthesis of PT education.  Baseline: Goal status: IN PROGRESS 10/14 (non compliant)   2. Pt will be able to demonstrate transfers from sitting without pain in order to demonstrate functional improvement in LE function for self-care and house hold duties.   Goal status:Some stiffness but no pain- MET 10/4   3.  Pt will score at least 11 pt increase on FOTO to demonstrate functional improvement in MCII and pt perceived function.   Baseline:  Goal status: MET 10/4     LONG TERM GOALS: Target date: 07/15/2023          Pt  will become independent with final HEP in order to demonstrate synthesis of PT education.  Baseline:  Goal status: MET 10/14   2.  Pt will be able to demonstrate/report ability to sit/stand/sleep for extended periods of time without pain in order to demonstrate functional improvement and tolerance to static positioning.    Baseline:  Goal status: MET 10/14   3.  Pt will score >/= 68 on FOTO to demonstrate improvement in perceived left hip function.  Baseline:  Goal status: IN PROGRESS 10/14 (67%)   4.  Pt will be able to demonstrate/report ability to walk >87mins without pain in order to demonstrate functional improvement and tolerance to exercise and community mobility.   Baseline:  Goal status: MET 10/14     PLAN:   PT FREQUENCY: 1-2x/week   PT DURATION: 12 weeks (plan for  D/C by 8 wks)   PLANNED INTERVENTIONS: Therapeutic exercises, Therapeutic activity, Neuromuscular re-education, Balance training, Gait training, Patient/Family education, Self Care, Joint mobilization, Joint manipulation, Orthotic/Fit training, DME instructions, Aquatic Therapy, Dry Needling, Electrical stimulation, Spinal manipulation, Spinal mobilization, Cryotherapy, Moist heat, scar mobilization, Splintting, Taping, Vasopneumatic device, Traction,  Ultrasound, Ionotophoresis 4mg /ml Dexamethasone, Manual therapy, and Re-evaluation.   PLAN FOR NEXT SESSION: DC to HEP today.    Donnel Saxon Jetty Berland, PTA 06/23/2023, 3:43 PM   Zebedee Iba PT, DPT 06/25/23 12:15 PM

## 2023-06-24 LAB — CUP PACEART REMOTE DEVICE CHECK
Date Time Interrogation Session: 20241013231248
Implantable Pulse Generator Implant Date: 20210322

## 2023-07-08 NOTE — Progress Notes (Signed)
Carelink Summary Report / Loop Recorder 

## 2023-07-12 ENCOUNTER — Encounter (HOSPITAL_BASED_OUTPATIENT_CLINIC_OR_DEPARTMENT_OTHER): Payer: PPO | Admitting: Physical Therapy

## 2023-07-28 ENCOUNTER — Ambulatory Visit (INDEPENDENT_AMBULATORY_CARE_PROVIDER_SITE_OTHER): Payer: PPO

## 2023-07-28 DIAGNOSIS — R002 Palpitations: Secondary | ICD-10-CM | POA: Diagnosis not present

## 2023-07-28 LAB — CUP PACEART REMOTE DEVICE CHECK
Date Time Interrogation Session: 20241117230243
Implantable Pulse Generator Implant Date: 20210322

## 2023-08-20 DIAGNOSIS — R39198 Other difficulties with micturition: Secondary | ICD-10-CM | POA: Diagnosis not present

## 2023-08-20 DIAGNOSIS — E291 Testicular hypofunction: Secondary | ICD-10-CM | POA: Diagnosis not present

## 2023-08-21 NOTE — Progress Notes (Signed)
Carelink Summary Report / Loop Recorder 

## 2023-08-22 DIAGNOSIS — R454 Irritability and anger: Secondary | ICD-10-CM | POA: Diagnosis not present

## 2023-08-22 DIAGNOSIS — I493 Ventricular premature depolarization: Secondary | ICD-10-CM | POA: Diagnosis not present

## 2023-08-22 DIAGNOSIS — Z6832 Body mass index (BMI) 32.0-32.9, adult: Secondary | ICD-10-CM | POA: Diagnosis not present

## 2023-08-22 DIAGNOSIS — M2550 Pain in unspecified joint: Secondary | ICD-10-CM | POA: Diagnosis not present

## 2023-08-22 DIAGNOSIS — E291 Testicular hypofunction: Secondary | ICD-10-CM | POA: Diagnosis not present

## 2023-08-22 DIAGNOSIS — N529 Male erectile dysfunction, unspecified: Secondary | ICD-10-CM | POA: Diagnosis not present

## 2023-08-22 DIAGNOSIS — R6882 Decreased libido: Secondary | ICD-10-CM | POA: Diagnosis not present

## 2023-08-25 DIAGNOSIS — H1045 Other chronic allergic conjunctivitis: Secondary | ICD-10-CM | POA: Diagnosis not present

## 2023-08-25 DIAGNOSIS — H2513 Age-related nuclear cataract, bilateral: Secondary | ICD-10-CM | POA: Diagnosis not present

## 2023-08-25 DIAGNOSIS — H35033 Hypertensive retinopathy, bilateral: Secondary | ICD-10-CM | POA: Diagnosis not present

## 2023-08-25 DIAGNOSIS — H5203 Hypermetropia, bilateral: Secondary | ICD-10-CM | POA: Diagnosis not present

## 2023-08-26 NOTE — Progress Notes (Deleted)
  Cardiology Office Note:  .   Date:  08/26/2023  ID:  Jerry Ruiz, DOB 07-03-1951, MRN [id number] PCP: Emilio Aspen, MD  [facility] Providers Cardiologist:  Lance Muss, MD Electrophysiologist:  Lanier Prude, MD {  History of Present Illness: .   Jerry Ruiz is a 72 y.o. 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 Johney Frame 08/18/23, discusses ATach, off amiodarone without palpitations, PVC burden 2.5%  Saw S. Riddle, NP 08/20/22, some palpitations reported but not bothersome and asked that his meds be reduced.  Reported some DOE w/u was in progress via Dr. Eldridge Dace for this No changes were made  Most recently saw Dr. Eldridge Dace 05/20/23, doing well, denied symptoms  Today's visit is scheduled as an annual visit  ROS:   *** PVC burden *** ATach? Arrhythmias *** symptoms   Device information MDT ILR, implanted 11/29/19 for palpitations  Arrhythmia/AAD hx Amiodarone stopped 2/2 hand tremor Feb 2022  Studies Reviewed: Marland Kitchen    EKG not done today  DEVICE interrogation done today and reviewed by myself *** Battery is good R waves are *** ***   2023 PET/CT showed: "The study is normal. The study is low risk.   LV perfusion is normal. There is no evidence of ischemia. There is no evidence of infarction.   Rest left ventricular function is normal. Rest EF: 54 %. Stress left ventricular function is normal. Stress EF: 67 %. End diastolic cavity size is mildly enlarged. End systolic cavity size is normal. No evidence of transient ischemic dilation (TID) noted."   "Left ventricular ejection fraction, by estimation, is 60 to 65%. The  left ventricle has normal function. The left ventricle has no regional  wall motion abnormalities. There is moderate concentric left ventricular  hypertrophy. Left ventricular  diastolic parameters were normal.   2. Right ventricular systolic function is normal. The right  ventricular  size is normal.   3. Left atrial size was mild to moderately dilated.   4. The mitral valve is normal in structure. Trivial mitral valve  regurgitation. No evidence of mitral stenosis.   5. The aortic valve is tricuspid. Aortic valve regurgitation is mild. No  aortic stenosis is present.   6. The inferior vena cava is normal in size with greater than 50%  respiratory variability, suggesting right atrial pressure of 3 mmHg. "    Risk Assessment/Calculations:    Physical Exam:   VS:  There were no vitals taken for this visit.   Wt Readings from Last 3 Encounters:  05/20/23 227 lb 6.4 oz (103.1 kg)  08/20/22 233 lb (105.7 kg)  06/27/22 231 lb (104.8 kg)    GEN: Well nourished, well developed in no acute distress NECK: No JVD; No carotid bruits CARDIAC: ***RRR, no murmurs, rubs, gallops RESPIRATORY:  *** CTA b/l without rales, wheezing or rhonchi  ABDOMEN: Soft, non-tender, non-distended EXTREMITIES:  *** No edema; No deformity   ILR site: *** is stable, no thinning, fluctuation, tethering  ASSESSMENT AND PLAN: .    PVCs ATach ***  HTN ***     {Are you ordering a CV Procedure (e.g. stress test, cath, DCCV, TEE, etc)?   Press F2        :[id number]}     Dispo: ***  Signed, Sheilah Pigeon, PA-C

## 2023-08-27 ENCOUNTER — Encounter: Payer: PPO | Admitting: Cardiology

## 2023-08-28 ENCOUNTER — Ambulatory Visit: Payer: PPO | Admitting: Physician Assistant

## 2023-09-01 ENCOUNTER — Ambulatory Visit: Payer: PPO

## 2023-09-01 DIAGNOSIS — R002 Palpitations: Secondary | ICD-10-CM

## 2023-09-01 LAB — CUP PACEART REMOTE DEVICE CHECK
Date Time Interrogation Session: 20241222230307
Implantable Pulse Generator Implant Date: 20210322

## 2023-09-04 ENCOUNTER — Other Ambulatory Visit: Payer: Self-pay | Admitting: Interventional Cardiology

## 2023-09-15 NOTE — Progress Notes (Addendum)
 Cardiology Office Note:  .   Date:  09/15/2023  ID:  Jerry Ruiz, DOB Apr 27, 1951, MRN [id number] PCP: Charlott Dorn LABOR, MD  [facility] Providers Cardiologist:  Candyce Reek, MD Electrophysiologist:  OLE ONEIDA HOLTS, MD {  History of Present Illness: .   Jerry Ruiz is a 73 y.o. 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 Kelsie 08/17/21, discusses ATach, off amiodarone  without palpitations, PVC burden 2.5%  Saw S. Riddle, NP 08/20/22, some palpitations reported but not bothersome and asked that his meds be reduced.  Reported some DOE w/u was in progress via Dr. Reek for this No changes were made  Most recently saw Dr. Reek 05/20/23, doing well, denied symptoms  Today's visit is scheduled as an annual visit  ROS:   Comes with his wife today PVCs seem to come out of nowhere, somewhat startling, but otherwise doing OK Wishes he was on less medicines Sad to see Dr. Reek leave, has an appt in march to meet Dr. Reek  Device information MDT ILR, implanted 11/29/19 for palpitations  Arrhythmia/AAD hx Amiodarone  stopped 2/2 hand tremor Feb 2022  Studies Reviewed: SABRA    EKG not done today  DEVICE transmission done today and reviewed by myself Battery is good No arrhythmias PVC burden is 1% Symptom episode (one) reviewed, SR with intermittent PVCs No couplets, grouping Parameters reviewed HR histogram looks good   2023 PET/CT showed: The study is normal. The study is low risk.   LV perfusion is normal. There is no evidence of ischemia. There is no evidence of infarction.   Rest left ventricular function is normal. Rest EF: 54 %. Stress left ventricular function is normal. Stress EF: 67 %. End diastolic cavity size is mildly enlarged. End systolic cavity size is normal. No evidence of transient ischemic dilation (TID) noted.   Left ventricular ejection fraction, by estimation, is 60 to  65%. The  left ventricle has normal function. The left ventricle has no regional  wall motion abnormalities. There is moderate concentric left ventricular  hypertrophy. Left ventricular  diastolic parameters were normal.   2. Right ventricular systolic function is normal. The right ventricular  size is normal.   3. Left atrial size was mild to moderately dilated.   4. The mitral valve is normal in structure. Trivial mitral valve  regurgitation. No evidence of mitral stenosis.   5. The aortic valve is tricuspid. Aortic valve regurgitation is mild. No  aortic stenosis is present.   6. The inferior vena cava is normal in size with greater than 50%  respiratory variability, suggesting right atrial pressure of 3 mmHg.     Risk Assessment/Calculations:    Physical Exam:   VS:  There were no vitals taken for this visit.   Wt Readings from Last 3 Encounters:  05/20/23 227 lb 6.4 oz (103.1 kg)  08/20/22 233 lb (105.7 kg)  06/27/22 231 lb (104.8 kg)    GEN: Well nourished, well developed in no acute distress NECK: No JVD; No carotid bruits CARDIAC: RRR, no murmurs, rubs, gallops RESPIRATORY:  CTA b/l without rales, wheezing or rhonchi  ABDOMEN: Soft, non-tender, non-distended EXTREMITIES:  No edema; No deformity    ASSESSMENT AND PLAN: .    PVCs ATach No arrhythmias PVC burden is 1%, he feels them Recommend no changes at this time  He has not yet met Dr. Holts 9previouisly seen by Dr. Kelsie  HTN Looks  good      Dispo: ILR transmissions as usual, advised to call when he sends a symptom episode so we can review it, otherwise in a year with EP/meet Dr. cindie, sooner if needed  Signed, Charlies Macario Arthur, PA-C

## 2023-09-18 ENCOUNTER — Ambulatory Visit: Payer: PPO | Attending: Physician Assistant | Admitting: Physician Assistant

## 2023-09-18 ENCOUNTER — Encounter: Payer: Self-pay | Admitting: Physician Assistant

## 2023-09-18 VITALS — BP 132/68 | HR 69 | Ht 71.0 in | Wt 228.2 lb

## 2023-09-18 DIAGNOSIS — I4719 Other supraventricular tachycardia: Secondary | ICD-10-CM

## 2023-09-18 DIAGNOSIS — I493 Ventricular premature depolarization: Secondary | ICD-10-CM

## 2023-09-18 DIAGNOSIS — I10 Essential (primary) hypertension: Secondary | ICD-10-CM | POA: Diagnosis not present

## 2023-09-18 DIAGNOSIS — Z95818 Presence of other cardiac implants and grafts: Secondary | ICD-10-CM | POA: Diagnosis not present

## 2023-09-18 NOTE — Patient Instructions (Signed)
Medication Instructions:   Your physician recommends that you continue on your current medications as directed. Please refer to the Current Medication list given to you today.  *If you need a refill on your cardiac medications before your next appointment, please call your pharmacy*   Lab Work:  NONE ORDERED  TODAY   If you have labs (blood work) drawn today and your tests are completely normal, you will receive your results only by: MyChart Message (if you have MyChart) OR A paper copy in the mail If you have any lab test that is abnormal or we need to change your treatment, we will call you to review the results.   Testing/Procedures: .NONE ORDERED  TODAY     Follow-Up: At [facility], you and your health needs are our priority.  As part of our continuing mission to provide you with exceptional heart care, we have created designated Provider Care Teams.  These Care Teams include your primary Cardiologist (physician) and Advanced Practice Providers (APPs -  Physician Assistants and Nurse Practitioners) who all work together to provide you with the care you need, when you need it.  We recommend signing up for the patient portal called "MyChart".  Sign up information is provided on this After Visit Summary.  MyChart is used to connect with patients for Virtual Visits (Telemedicine).  Patients are able to view lab/test results, encounter notes, upcoming appointments, etc.  Non-urgent messages can be sent to your provider as well.   To learn more about what you can do with MyChart, go to [email or web address].    Your next appointment:   1 year(s)  Provider:   Cameron Lambert, MD    Other Instructions  

## 2023-09-22 ENCOUNTER — Other Ambulatory Visit: Payer: Self-pay | Admitting: Interventional Cardiology

## 2023-10-06 ENCOUNTER — Ambulatory Visit: Payer: PPO

## 2023-10-06 DIAGNOSIS — R002 Palpitations: Secondary | ICD-10-CM | POA: Diagnosis not present

## 2023-10-06 LAB — CUP PACEART REMOTE DEVICE CHECK
Date Time Interrogation Session: 20250126230137
Implantable Pulse Generator Implant Date: 20210322

## 2023-10-07 ENCOUNTER — Encounter: Payer: Self-pay | Admitting: Cardiology

## 2023-10-09 NOTE — Progress Notes (Signed)
[facility] Summary Report / Loop Recorder

## 2023-11-05 ENCOUNTER — Telehealth: Payer: Self-pay | Admitting: Internal Medicine

## 2023-11-05 DIAGNOSIS — E785 Hyperlipidemia, unspecified: Secondary | ICD-10-CM

## 2023-11-05 NOTE — Telephone Encounter (Signed)
 Jerry Nose, MD  You1 hour ago (12:28 PM)    He is a new patient to me - I don't see a lipid profile since 2022 - please get a lipid NMR and LP(a).   Dr. Rexene Edison   Patient identification verified by 2 forms. Marilynn Rail, RN    Called and spoke to patient  Informed patient: -lab orders placed  -can present to office or [facility] facility near him  -complete fasting labs by Monday  Patient agrees with plan, no questions at this time

## 2023-11-05 NOTE — Telephone Encounter (Signed)
 Patient wants to get orders for lab work to be done prior to visit on 3/5.

## 2023-11-07 DIAGNOSIS — E785 Hyperlipidemia, unspecified: Secondary | ICD-10-CM | POA: Diagnosis not present

## 2023-11-08 LAB — LIPOPROTEIN A (LPA): Lipoprotein (a): 10.2 nmol/L (ref <75.0)

## 2023-11-09 LAB — NMR, LIPOPROFILE
Cholesterol, Total: 165 mg/dL (ref 100–199)
HDL Particle Number: 21.5 umol/L — ABNORMAL LOW (ref >=30.5)
HDL-C: 24 mg/dL — ABNORMAL LOW (ref >39)
LDL Particle Number: 1750 nmol/L — ABNORMAL HIGH (ref <1000)
LDL Size: 20 nm — ABNORMAL LOW (ref >20.5)
LDL-C (NIH Calc): 118 mg/dL — ABNORMAL HIGH (ref 0–99)
LP-IR Score: 63 — ABNORMAL HIGH (ref <=45)
Small LDL Particle Number: 1134 nmol/L — ABNORMAL HIGH (ref <=527)
Triglycerides: 125 mg/dL (ref 0–149)

## 2023-11-10 ENCOUNTER — Ambulatory Visit (INDEPENDENT_AMBULATORY_CARE_PROVIDER_SITE_OTHER): Payer: PPO

## 2023-11-10 DIAGNOSIS — R002 Palpitations: Secondary | ICD-10-CM

## 2023-11-11 ENCOUNTER — Encounter: Payer: Self-pay | Admitting: Cardiology

## 2023-11-11 LAB — CUP PACEART REMOTE DEVICE CHECK
Date Time Interrogation Session: 20250302230043
Implantable Pulse Generator Implant Date: 20210322

## 2023-11-12 ENCOUNTER — Ambulatory Visit: Payer: PPO | Attending: Internal Medicine | Admitting: Internal Medicine

## 2023-11-12 VITALS — BP 130/78 | HR 83 | Ht 71.0 in | Wt 222.0 lb

## 2023-11-12 DIAGNOSIS — I4719 Other supraventricular tachycardia: Secondary | ICD-10-CM | POA: Diagnosis not present

## 2023-11-12 DIAGNOSIS — I2510 Atherosclerotic heart disease of native coronary artery without angina pectoris: Secondary | ICD-10-CM

## 2023-11-12 DIAGNOSIS — I10 Essential (primary) hypertension: Secondary | ICD-10-CM | POA: Diagnosis not present

## 2023-11-12 DIAGNOSIS — E785 Hyperlipidemia, unspecified: Secondary | ICD-10-CM | POA: Diagnosis not present

## 2023-11-12 DIAGNOSIS — I700 Atherosclerosis of aorta: Secondary | ICD-10-CM

## 2023-11-12 NOTE — Patient Instructions (Signed)
 Medication Instructions:  NO CHANGES  *If you need a refill on your cardiac medications before your next appointment, please call your pharmacy*   Lab Work: FASTING lab work in 6 months -- complete about ONE WEEK before next appointment  If you have labs (blood work) drawn today and your tests are completely normal, you will receive your results only by: MyChart Message (if you have MyChart) OR A paper copy in the mail If you have any lab test that is abnormal or we need to change your treatment, we will call you to review the results.  Follow-Up: At [facility], you and your health needs are our priority.  As part of our continuing mission to provide you with exceptional heart care, we have created designated Provider Care Teams.  These Care Teams include your primary Cardiologist (physician) and Advanced Practice Providers (APPs -  Physician Assistants and Nurse Practitioners) who all work together to provide you with the care you need, when you need it.  We recommend signing up for the patient portal called "MyChart".  Sign up information is provided on this After Visit Summary.  MyChart is used to connect with patients for Virtual Visits (Telemedicine).  Patients are able to view lab/test results, encounter notes, upcoming appointments, etc.  Non-urgent messages can be sent to your provider as well.   To learn more about what you can do with MyChart, go to [email or web address].    Your next appointment:   6 months with Dr. Rennis Golden -- call in April for an August/Sept 2025 appointment.      1st Floor: - Lobby - Registration  - Pharmacy  - Lab - Cafe  2nd Floor: - PV Lab - Diagnostic Testing (echo, CT, nuclear med)  3rd Floor: - Vacant  4th Floor: - TCTS (cardiothoracic surgery) - AFib Clinic - Structural Heart Clinic - Vascular Surgery  - Vascular Ultrasound  5th Floor: - HeartCare Cardiology (general and EP) - Clinical Pharmacy for coumadin,  hypertension, lipid, weight-loss medications, and med management appointments    Valet parking services will be available as well.

## 2023-11-12 NOTE — Progress Notes (Signed)
 OFFICE NOTE  Chief Complaint:  Establish cardiologist  Primary Care Physician: Emilio Aspen, MD  HPI:  Jerry Ruiz is a 73 y.o. male with a past medial history significant for atrial tachycardia and PVCs.  He had an implanted loop monitor which is being followed.  He was previously on amiodarone but did well for that.  He also has known coronary artery calcification by calcium score in 2022 which showed a score of 298, 65th percentile for age and sex matched controls.  There is also aortic atherosclerosis.  He is been very hesitant to take statin or other lipid-lowering therapies.  He was previously followed by Dr. Eldridge Dace and is transferring care to me.  I did repeat lipid testing on him his LPA was negative at 10.2.  His LDL however is elevated at 118 with a particle number of 1750, low HDL 24 triglycerides 125.  Small LDL particles 1134.  He tells me recently switched over to a carnivore type diet and is lost weight and feels that this will improve his lipids.  We talked about the wrist of his cholesterol being still quite elevated as far as progression of coronary disease or developing symptomatic coronary disease.  He does have concerns about potential for memory loss, dementia or Parkinson's.  There is very little evidence to support these potential side effects.  He has recently stopped a couple blood pressure medications on his own noting that his blood pressure, particular the diastolic was getting low.  Today was 140/80 however I rechecked it at the end of the visit at 130/78.  No adjustments to his meds were made.  PMHx:  Past Medical History:  Diagnosis Date   Arthritis    Bifascicular block    pvc's   Bladder stone    Headache    History of echocardiogram    a. Echo 12/15: EF 55-60%, no RWMA, mild LAE, trivial MR   History of echocardiogram    Echo 12/17: mod conc LVH, EF 55-60, no RWMA, Gr 1 DD, mild AI, calcified AV leaflets, trivial MR, mod LAE, normal RVSF, mild  TR, no pericardial eff   HTN (hypertension)    Pneumonia    PVC's (premature ventricular contractions)    Skin cancer    melanoma bil ears   Sleep apnea    cpap    Past Surgical History:  Procedure Laterality Date   HEMORROIDECTOMY     implantable loop recorder implantation  11/29/2019   Medtronic Reveal Linq model LNQ 22 (SN [id number]) implanted by Dr Johney Frame for further evaluation of palpitations and arrhythmia management   INGUINAL HERNIA REPAIR Bilateral 03/28/2020   Procedure: LAPAROSCOPIC BILATERAL INGUINAL HERNIA REPAIR WITH MESH;  Surgeon: Kinsinger, De Blanch, MD;  Location: [facility];  Service: General;  Laterality: Bilateral;   KNEE SURGERY     arthroscopic bil   MOHS SURGERY     right big toe     bone removed   SHOULDER SURGERY     rotator right    TONSILLECTOMY     VASECTOMY     1987    FAMHx:  Family History  Problem Relation Age of Onset   Arrhythmia Mother    Hypertension Mother    Heart disease Mother    Parkinson's disease Father    Hypertension Father     SOCHx:   reports that he quit smoking about 52 years ago. His smoking use included cigarettes. He started smoking about 57 years ago. He has never  used smokeless tobacco. He reports current alcohol use. He reports that he does not use drugs.  ALLERGIES:  Allergies  Allergen Reactions   Carvedilol Other (See Comments)   Cephalexin Other (See Comments)   Losartan Potassium Other (See Comments)   Amlodipine Other (See Comments)    Low energy    Sulfa Antibiotics Rash, Hives and Itching    ROS: Pertinent items noted in HPI and remainder of comprehensive ROS otherwise negative.  HOME MEDS: Current Outpatient Medications on File Prior to Visit  Medication Sig Dispense Refill   FISH OIL-COENZYME Q10 PO Take 1 capsule by mouth daily.      ibuprofen (ADVIL) 800 MG tablet Take 1 tablet (800 mg total) by mouth every 8 (eight) hours as needed. 30 tablet 0   metoprolol succinate (TOPROL-XL) 50 MG 24 hr  tablet Take 25 mg by mouth daily. Take with or immediately following a meal.     Misc Natural Products (PROSTATE SUPPORT PO) Take 2 tablets by mouth daily. Prostate Pro otc supplement     Multiple Vitamin (MULTIVITAMIN) capsule Take 1 capsule by mouth daily.     Probiotic Product (PROBIOTIC DAILY PO) Take 1 tablet by mouth at bedtime.      spironolactone (ALDACTONE) 25 MG tablet TAKE 1 TABLET (25 MG TOTAL) BY MOUTH DAILY. 90 tablet 0   tadalafil (CIALIS) 5 MG tablet Take 5 mg by mouth daily.     No current facility-administered medications on file prior to visit.    LABS/IMAGING: No results found for this or any previous visit (from the past 48 hours). No results found.  LIPID PANEL:    Component Value Date/Time   CHOL 153 05/24/2014 0927   TRIG 89 05/24/2014 0927   HDL 30 (L) 05/24/2014 0927   CHOLHDL 5.1 05/24/2014 0927   VLDL 18 05/24/2014 0927   LDLCALC 105 (H) 05/24/2014 0927     WEIGHTS: Wt Readings from Last 3 Encounters:  11/12/23 222 lb (100.7 kg)  09/18/23 228 lb 3.2 oz (103.5 kg)  05/20/23 227 lb 6.4 oz (103.1 kg)    VITALS: BP (!) 140/80 (BP Location: Left Arm, Patient Position: Sitting, Cuff Size: Normal)   Pulse 83   Ht 5\' 11"  (1.803 m)   Wt 222 lb (100.7 kg)   BMI 30.96 kg/m   EXAM: General appearance: alert and no distress Neck: no carotid bruit, no JVD, and thyroid not enlarged, symmetric, no tenderness/mass/nodules Lungs: clear to auscultation bilaterally Heart: regular rate and rhythm Abdomen: soft, non-tender; bowel sounds normal; no masses,  no organomegaly Extremities: extremities normal, atraumatic, no cyanosis or edema Pulses: 2+ and symmetric Skin: Skin color, texture, turgor normal. No rashes or lesions Neurologic: Grossly normal Psych: Pleasant  EKG: EKG Interpretation Date/Time:  Wednesday November 12 2023 09:59:15 EST Ventricular Rate:  83 PR Interval:  148 QRS Duration:  132 QT Interval:  412 QTC Calculation: 484 R  Axis:   -60  Text Interpretation: Normal sinus rhythm Right bundle branch block Left anterior fascicular block Bifascicular block Minimal voltage criteria for LVH, may be normal variant ( R in aVL ) When compared with ECG of 20-May-2023 08:01, No significant change was found Confirmed by Zoila Shutter [phone]) on 11/12/2023 10:01:07 AM    ASSESSMENT: Mixed dyslipidemia, goal LDL less than 70 Elevated CAC score 298, 65th percentile (2022) Aortic atherosclerosis Negative LP(a) Essential hypertension History of atrial tachycardia and PVCs, status post implanted loop recorder  PLAN: 1.   Mr. Bastos has a mixed dyslipidemia  but is currently on no lipid-lowering therapies.  He is trying to manage it with diet and weight loss.  Recently has had some weight loss on a carnivore type diet.  He is LDL still remains well above target, particularly has a number of small LDL particles which are higher risk.  He had a greater than expected calcium score for age.  Blood pressure is still somewhat above target 120/80 and recently had cut back on some of his medications.  We discussed options on therapy to help lower his cardiovascular risk but at this time he wants to continue to work with dietary and lifestyle modifications.  Plan follow-up in 6 months or sooner as necessary.  TIME SPENT WITH PATIENT: 45 minutes of direct patient care. More than 50% of that time was spent on coordination of care and counseling regarding discussing lipid lowering therapies, diet, literature review, review of chart, risk assessment, blood pressure evaluation, and other things.   Chrystie Nose, MD, [facility], FACP  [place]  [facility]  Medical Director of the [facility]  [facility] Diplomate of the American Board of Clinical Lipidology Attending Cardiologist  Direct Dial: [phone]  Fax: [phone]  Website:  [email or web address]   Lisette Abu Khadeeja Elden 11/12/2023, 10:01 AM

## 2023-11-13 NOTE — Progress Notes (Signed)
 Carelink Summary Report / Loop Recorder

## 2023-12-01 DIAGNOSIS — E291 Testicular hypofunction: Secondary | ICD-10-CM | POA: Diagnosis not present

## 2023-12-01 DIAGNOSIS — R39198 Other difficulties with micturition: Secondary | ICD-10-CM | POA: Diagnosis not present

## 2023-12-03 DIAGNOSIS — G479 Sleep disorder, unspecified: Secondary | ICD-10-CM | POA: Diagnosis not present

## 2023-12-03 DIAGNOSIS — I10 Essential (primary) hypertension: Secondary | ICD-10-CM | POA: Diagnosis not present

## 2023-12-03 DIAGNOSIS — R6882 Decreased libido: Secondary | ICD-10-CM | POA: Diagnosis not present

## 2023-12-03 DIAGNOSIS — R5383 Other fatigue: Secondary | ICD-10-CM | POA: Diagnosis not present

## 2023-12-03 DIAGNOSIS — Z6830 Body mass index (BMI) 30.0-30.9, adult: Secondary | ICD-10-CM | POA: Diagnosis not present

## 2023-12-03 DIAGNOSIS — M2550 Pain in unspecified joint: Secondary | ICD-10-CM | POA: Diagnosis not present

## 2023-12-03 DIAGNOSIS — N529 Male erectile dysfunction, unspecified: Secondary | ICD-10-CM | POA: Diagnosis not present

## 2023-12-03 DIAGNOSIS — E291 Testicular hypofunction: Secondary | ICD-10-CM | POA: Diagnosis not present

## 2023-12-03 DIAGNOSIS — F32A Depression, unspecified: Secondary | ICD-10-CM | POA: Diagnosis not present

## 2023-12-03 DIAGNOSIS — R454 Irritability and anger: Secondary | ICD-10-CM | POA: Diagnosis not present

## 2023-12-03 DIAGNOSIS — Z79890 Hormone replacement therapy (postmenopausal): Secondary | ICD-10-CM | POA: Diagnosis not present

## 2023-12-11 NOTE — Progress Notes (Signed)
 Carelink Summary Report / Loop Recorder

## 2023-12-11 NOTE — Addendum Note (Signed)
 Addended by: Geralyn Flash D on: 12/11/2023 01:48 PM   Modules accepted: Orders

## 2023-12-15 ENCOUNTER — Ambulatory Visit (INDEPENDENT_AMBULATORY_CARE_PROVIDER_SITE_OTHER): Payer: PPO

## 2023-12-15 DIAGNOSIS — R002 Palpitations: Secondary | ICD-10-CM | POA: Diagnosis not present

## 2023-12-16 LAB — CUP PACEART REMOTE DEVICE CHECK
Date Time Interrogation Session: 20250406230059
Implantable Pulse Generator Implant Date: 20210322

## 2023-12-20 ENCOUNTER — Encounter: Payer: Self-pay | Admitting: Cardiology

## 2024-01-19 ENCOUNTER — Ambulatory Visit (INDEPENDENT_AMBULATORY_CARE_PROVIDER_SITE_OTHER): Payer: PPO

## 2024-01-19 DIAGNOSIS — R002 Palpitations: Secondary | ICD-10-CM

## 2024-01-19 LAB — CUP PACEART REMOTE DEVICE CHECK
Date Time Interrogation Session: 20250511233227
Implantable Pulse Generator Implant Date: 20210322

## 2024-01-25 ENCOUNTER — Ambulatory Visit: Payer: Self-pay | Admitting: Cardiology

## 2024-01-27 NOTE — Progress Notes (Signed)
 Carelink Summary Report / Loop Recorder

## 2024-01-27 NOTE — Addendum Note (Signed)
 Addended by: Edra Govern D on: 01/27/2024 04:40 PM   Modules accepted: Orders

## 2024-01-30 DIAGNOSIS — M545 Low back pain, unspecified: Secondary | ICD-10-CM | POA: Diagnosis not present

## 2024-02-10 ENCOUNTER — Telehealth: Payer: Self-pay | Admitting: Internal Medicine

## 2024-02-10 MED ORDER — SPIRONOLACTONE 25 MG PO TABS: 25.0000 mg | ORAL_TABLET | Freq: Every day | ORAL | 3 refills | Status: DC

## 2024-02-10 NOTE — Telephone Encounter (Signed)
 Pt's medication was sent to pt's pharmacy as requested. Confirmation received.

## 2024-02-10 NOTE — Telephone Encounter (Signed)
*  STAT* If patient is at the pharmacy, call can be transferred to refill team.   1. Which medications need to be refilled? (please list name of each medication and dose if known) spironolactone  (ALDACTONE ) 25 MG tablet  2. Which pharmacy/location (including street and city if local pharmacy) is medication to be sent to? [id number] - [place], [place] - [address]  3. Do they need a 30 day or 90 day supply?   90 day supply

## 2024-02-12 DIAGNOSIS — M5459 Other low back pain: Secondary | ICD-10-CM | POA: Diagnosis not present

## 2024-02-12 DIAGNOSIS — M545 Low back pain, unspecified: Secondary | ICD-10-CM | POA: Diagnosis not present

## 2024-02-13 DIAGNOSIS — M545 Low back pain, unspecified: Secondary | ICD-10-CM | POA: Diagnosis not present

## 2024-02-16 DIAGNOSIS — M545 Low back pain, unspecified: Secondary | ICD-10-CM | POA: Diagnosis not present

## 2024-02-18 ENCOUNTER — Telehealth: Payer: Self-pay | Admitting: *Deleted

## 2024-02-18 ENCOUNTER — Telehealth: Payer: Self-pay

## 2024-02-18 DIAGNOSIS — M5416 Radiculopathy, lumbar region: Secondary | ICD-10-CM | POA: Diagnosis not present

## 2024-02-18 NOTE — Telephone Encounter (Signed)
 Pt has been scheduled tele preop appt 02/25/24. Pt tells me that he just had an injection today for his back. I did advise the pt to call his surgeon office as to I have been told by some surgery schedulers if recent injection they sometimes have to wait 3 months before other procedure. Pt said he will confirm and let us  know if we have to change his tele appt.   Med rec and consent are done.     Patient Consent for Virtual Visit        Jerry Ruiz has provided verbal consent on 02/18/2024 for a virtual visit (video or telephone).   CONSENT FOR VIRTUAL VISIT FOR:  Jerry Ruiz  By participating in this virtual visit I agree to the following:  I hereby voluntarily request, consent and authorize [facility] and its employed or contracted physicians, physician assistants, nurse practitioners or other licensed health care professionals (the Practitioner), to provide me with telemedicine health care services (the "Services) as deemed necessary by the treating Practitioner. I acknowledge and consent to receive the Services by the Practitioner via telemedicine. I understand that the telemedicine visit will involve communicating with the Practitioner through live audiovisual communication technology and the disclosure of certain medical information by electronic transmission. I acknowledge that I have been given the opportunity to request an in-person assessment or other available alternative prior to the telemedicine visit and am voluntarily participating in the telemedicine visit.  I understand that I have the right to withhold or withdraw my consent to the use of telemedicine in the course of my care at any time, without affecting my right to future care or treatment, and that the Practitioner or I may terminate the telemedicine visit at any time. I understand that I have the right to inspect all information obtained and/or recorded in the course of the telemedicine visit and may receive  copies of available information for a reasonable fee.  I understand that some of the potential risks of receiving the Services via telemedicine include:  Delay or interruption in medical evaluation due to technological equipment failure or disruption; Information transmitted may not be sufficient (e.g. poor resolution of images) to allow for appropriate medical decision making by the Practitioner; and/or  In rare instances, security protocols could fail, causing a breach of personal health information.  Furthermore, I acknowledge that it is my responsibility to provide information about my medical history, conditions and care that is complete and accurate to the best of my ability. I acknowledge that Practitioner's advice, recommendations, and/or decision may be based on factors not within their control, such as incomplete or inaccurate data provided by me or distortions of diagnostic images or specimens that may result from electronic transmissions. I understand that the practice of medicine is not an exact science and that Practitioner makes no warranties or guarantees regarding treatment outcomes. I acknowledge that a copy of this consent can be made available to me via my patient portal [facility] MyChart), or I can request a printed copy by calling the office of [facility].    I understand that my insurance will be billed for this visit.   I have read or had this consent read to me. I understand the contents of this consent, which adequately explains the benefits and risks of the Services being provided via telemedicine.  I have been provided ample opportunity to ask questions regarding this consent and the Services and have had my questions answered to  my satisfaction. I give my informed consent for the services to be provided through the use of telemedicine in my medical care

## 2024-02-18 NOTE — Telephone Encounter (Signed)
   Pre-operative Risk Assessment    Patient Name: Jerry Ruiz DOBEK  DOB: 1951-07-09 MRN: [id number]   Date of last office visit: 11/12/23 Dinah Franco, MD Date of next office visit: NONE   Request for Surgical Clearance    Procedure:  CENTRAL LAMINECTOMY L4-5 AND EXCISION  Date of Surgery:  Clearance TBD                                Surgeon:  DR Orvan Blanch Surgeon's Group or Practice Name:  [facility] Phone number:  [phone] Fax number:  [phone] ATTN: Avanell Bob STONE   Type of Clearance Requested:   - Medical    Type of Anesthesia:  Not Indicated   Additional requests/questions:    SignedCollin Deal   02/18/2024, 4:32 PM

## 2024-02-18 NOTE — Telephone Encounter (Signed)
 Pt has been scheduled tele preop appt 02/25/24. Pt tells me that he just had an injection today for his back. I did advise the pt to call his surgeon office as to I have been told by some surgery schedulers if recent injection they sometimes have to wait 3 months before other procedure. Pt said he will confirm and let us  know if we have to change his tele appt.   Med rec and consent are done.

## 2024-02-19 ENCOUNTER — Ambulatory Visit (INDEPENDENT_AMBULATORY_CARE_PROVIDER_SITE_OTHER)

## 2024-02-19 DIAGNOSIS — I4719 Other supraventricular tachycardia: Secondary | ICD-10-CM | POA: Diagnosis not present

## 2024-02-19 LAB — CUP PACEART REMOTE DEVICE CHECK
Date Time Interrogation Session: 20250611230201
Implantable Pulse Generator Implant Date: 20210322

## 2024-02-20 NOTE — Telephone Encounter (Signed)
 Pt called in stating requesting office told him to call and see if there is sooner something for clearance because he is in a lot of pain. Please advise.

## 2024-02-20 NOTE — Telephone Encounter (Signed)
 S/W pt and informed him that as of right now our office does not have any Tele Preop appts that are sooner then the TELE Preop appt on 02/25/24. Informed the pt that if we have something sooner come open we will inform the patient.

## 2024-02-21 ENCOUNTER — Ambulatory Visit: Payer: Self-pay | Admitting: Cardiology

## 2024-02-23 NOTE — Telephone Encounter (Signed)
 Patient called in requesting a sooner preop clearance appt. Advised we had in office availability for tomorrow, 06/17 with NP, Palmer Bobo. Patient stated he did not want to come in for a sooner visit due to being in a wheelchair right now making it too hard to get around.  Advised pt he is still on the wait list incase of sooner tele-visit coming available. Patient verbalized understanding.

## 2024-02-25 ENCOUNTER — Ambulatory Visit: Attending: Cardiology | Admitting: Student

## 2024-02-25 DIAGNOSIS — Z01810 Encounter for preprocedural cardiovascular examination: Secondary | ICD-10-CM | POA: Diagnosis not present

## 2024-02-25 NOTE — Progress Notes (Signed)
 Virtual Visit via Telephone Note   Because of Jerry Ruiz's co-morbid illnesses, he is at least at moderate risk for complications without adequate follow up.  This format is felt to be most appropriate for this patient at this time.  The patient did not have access to video technology/had technical difficulties with video requiring transitioning to audio format only (telephone).  All issues noted in this document were discussed and addressed.  No physical exam could be performed with this format.  Please refer to the patient's chart for his consent to telehealth for [facility]  Evaluation Performed:  Preoperative cardiovascular risk assessment _____________   Date:  02/25/2024   Patient ID:  Jerry Ruiz, Jerry Ruiz [birth year], MRN [id number] Patient Location:  Home Provider location:   Office  Primary Care Provider:  Benedetta Bradley, MD [facility] Providers Cardiologist:  Hazle Lites, MD Electrophysiologist:  Boyce Byes, MD   Chief Complaint / Patient Profile   73 y.o. y/o male with a h/o coronary artery calcification, mitral valve disease, palpitations/PVCs/PSVT, hypertension, hyperlipidemia, OSA who is pending central laminectomy L4-5 and excision by Dr. Leighton Punches and presents today for telephonic preoperative cardiovascular risk assessment.  History of Present Illness    Jerry Ruiz is a 73 y.o. male who presents via audio/video conferencing for a telehealth visit today.  Pt was last seen in cardiology clinic on 11/12/2023 by Dr. Maximo Spar.  At that time CLARK CUFF was stable from a cardiac standpoint.  The patient is now pending procedure as outlined above. Since his last visit, patient denies shortness of breath, dyspnea on exertion, lower extremity edema, orthopnea or PND. No chest pain, pressure, or tightness. He reports a history of palpitations unchanged for years. He has been in an increased amount of pain over the last month. He reports  previously performing yard work and light to moderate household activities. He is now walking with a walker for support.     Past Medical History    Past Medical History:  Diagnosis Date   Arthritis    Bifascicular block    pvc's   Bladder stone    Headache    History of echocardiogram    a. Echo 12/15: EF 55-60%, no RWMA, mild LAE, trivial MR   History of echocardiogram    Echo 12/17: mod conc LVH, EF 55-60, no RWMA, Gr 1 DD, mild AI, calcified AV leaflets, trivial MR, mod LAE, normal RVSF, mild TR, no pericardial eff   HTN (hypertension)    Pneumonia    PVC's (premature ventricular contractions)    Skin cancer    melanoma bil ears   Sleep apnea    cpap   Past Surgical History:  Procedure Laterality Date   HEMORROIDECTOMY     implantable loop recorder implantation  11/29/2019   Medtronic Reveal Linq model LNQ 22 (SN [id number]) implanted by Dr Nunzio Belch for further evaluation of palpitations and arrhythmia management   INGUINAL HERNIA REPAIR Bilateral 03/28/2020   Procedure: LAPAROSCOPIC BILATERAL INGUINAL HERNIA REPAIR WITH MESH;  Surgeon: Kinsinger, Alphonso Aschoff, MD;  Location: [facility];  Service: General;  Laterality: Bilateral;   KNEE SURGERY     arthroscopic bil   MOHS SURGERY     right big toe     bone removed   SHOULDER SURGERY     rotator right    TONSILLECTOMY     VASECTOMY     1987    Allergies  Allergies  Allergen Reactions  Carvedilol  Other (See Comments)   Cephalexin Other (See Comments)   Losartan  Potassium Other (See Comments)   Amlodipine  Other (See Comments)    Low energy    Sulfa Antibiotics Rash, Hives and Itching    Home Medications    Prior to Admission medications   Medication Sig Start Date End Date Taking? Authorizing Provider  FISH OIL-COENZYME Q10 PO Take 1 capsule by mouth daily.     [provider]  ibuprofen  (ADVIL ) 800 MG tablet Take 1 tablet (800 mg total) by mouth every 8 (eight) hours as needed. Patient not taking:  Reported on 02/18/2024 03/28/20   Kinsinger, Alphonso Aschoff, MD  metoprolol  succinate (TOPROL -XL) 50 MG 24 hr tablet Take 25 mg by mouth daily. Take with or immediately following a meal.    [provider]  Misc Natural Products (PROSTATE SUPPORT PO) Take 2 tablets by mouth daily. Prostate Pro otc supplement Patient not taking: Reported on 02/18/2024    [provider]  Multiple Vitamin (MULTIVITAMIN) capsule Take 1 capsule by mouth daily.    [provider]  Probiotic Product (PROBIOTIC DAILY PO) Take 1 tablet by mouth at bedtime.     [provider]  spironolactone  (ALDACTONE ) 25 MG tablet Take 1 tablet (25 mg total) by mouth daily. 02/10/24   Hilty, Aviva Lemmings, MD  tadalafil (CIALIS) 5 MG tablet Take 5 mg by mouth daily. 08/12/22   [provider]    Physical Exam    Vital Signs:  Lendon L Pender does not have vital signs available for review today.  Given telephonic nature of communication, physical exam is limited. AAOx3. NAD. Normal affect.  Speech and respirations are unlabored.   Assessment & Plan    [facility] Providers Cardiologist:  Hazle Lites, MD Electrophysiologist:  Boyce Byes, MD   Preoperative cardiovascular risk assessment.  Central laminectomy L4-5 and excision by Dr. Leighton Punches.  Chart reviewed as part of pre-operative protocol coverage. According to the RCRI, patient has a 0.4-3.9% risk of MACE. Patient reports activity equivalent to 4.0 METS.   Given past medical history and time since last visit, based on ACC/AHA guidelines, Tristin L Oleksy would be at acceptable risk for the planned procedure without further cardiovascular testing.   Patient was advised that if he develops new symptoms prior to surgery to contact our office to arrange a follow-up appointment.  he verbalized understanding. I will route this recommendation to the requesting party via Epic fax function.  Please call with questions.  Time:   Today,  I have spent 10 minutes with the patient with telehealth technology discussing medical history, symptoms, and management plan.     Morey Ar, NP  02/25/2024, 2:52 PM

## 2024-03-05 ENCOUNTER — Ambulatory Visit: Payer: Self-pay | Admitting: Orthopedic Surgery

## 2024-03-05 DIAGNOSIS — M48062 Spinal stenosis, lumbar region with neurogenic claudication: Secondary | ICD-10-CM

## 2024-03-08 NOTE — Progress Notes (Signed)
 Carelink Summary Report / Loop Recorder

## 2024-03-11 NOTE — Pre-Procedure Instructions (Signed)
 Surgical Instructions   Your procedure is scheduled on March 18, 2024. Report to [facility] Main Entrance A at 10:30 A.M., then check in with the Admitting office. Any questions or running late day of surgery: call [phone]  Questions prior to your surgery date: call [phone], Monday-Friday, 8am-4pm. If you experience any cold or flu symptoms such as cough, fever, chills, shortness of breath, etc. between now and your scheduled surgery, please notify us  at the above number.     Remember:  Do not eat after midnight the night before your surgery  You may drink clear liquids until 9:30 AM the morning of your surgery.   Clear liquids allowed are: Water, Non-Citrus Juices (without pulp), Carbonated Beverages, Clear Tea (no milk, honey, etc.), Black Coffee Only (NO MILK, CREAM OR POWDERED CREAMER of any kind), and Gatorade.  Patient Instructions  The night before surgery:  No food after midnight. ONLY clear liquids after midnight  The day of surgery (if you do NOT have diabetes):  Drink ONE (1) Pre-Surgery Clear Ensure by 9:30 AM the morning of surgery. Drink in one sitting. Do not sip.  This drink was given to you during your hospital  pre-op appointment visit.  Nothing else to drink after completing the  Pre-Surgery Clear Ensure.         If you have questions, please contact your surgeon's office.    Take these medicines the morning of surgery with A SIP OF WATER: anastrozole (ARIMIDEX)  metoprolol  succinate (TOPROL -XL)  tadalafil (CIALIS)    May take these medicines IF NEEDED: methocarbamol (ROBAXIN)  oxyCODONE  (OXY IR/ROXICODONE )    One week prior to surgery, STOP taking any Aspirin (unless otherwise instructed by your surgeon) Aleve, Naproxen, Ibuprofen , Motrin , Advil , Goody's, BC's, all herbal medications, fish oil, and non-prescription vitamins. This includes your medication: meloxicam (MOBIC)                      Do NOT Smoke (Tobacco/Vaping) for 24 hours  prior to your procedure.  If you use a CPAP at night, you may bring your mask/headgear for your overnight stay.   You will be asked to remove any contacts, glasses, piercing's, hearing aid's, dentures/partials prior to surgery. Please bring cases for these items if needed.    Patients discharged the day of surgery will not be allowed to drive home, and someone needs to stay with them for 24 hours.  SURGICAL WAITING ROOM VISITATION Patients may have no more than 2 support people in the waiting area - these visitors may rotate.   Pre-op nurse will coordinate an appropriate time for 1 ADULT support person, who may not rotate, to accompany patient in pre-op.  Children under the age of 36 must have an adult with them who is not the patient and must remain in the main waiting area with an adult.  If the patient needs to stay at the hospital during part of their recovery, the visitor guidelines for inpatient rooms apply.  Please refer to the [facility] website for the visitor guidelines for any additional information.   If you received a COVID test during your pre-op visit  it is requested that you wear a mask when out in public, stay away from anyone that may not be feeling well and notify your surgeon if you develop symptoms. If you have been in contact with anyone that has tested positive in the last 10 days please notify you surgeon.      Pre-operative 5  CHG Bathing Instructions   You can play a key role in reducing the risk of infection after surgery. Your skin needs to be as free of germs as possible. You can reduce the number of germs on your skin by washing with CHG (chlorhexidine  gluconate) soap before surgery. CHG is an antiseptic soap that kills germs and continues to kill germs even after washing.   DO NOT use if you have an allergy to chlorhexidine /CHG or antibacterial soaps. If your skin becomes reddened or irritated, stop using the CHG and notify one of our RNs at [phone].    Please shower with the CHG soap starting 4 days before surgery using the following schedule:     Please keep in mind the following:  DO NOT shave, including legs and underarms, starting the day of your first shower.   You may shave your face at any point before/day of surgery.  Place clean sheets on your bed the day you start using CHG soap. Use a clean washcloth (not used since being washed) for each shower. DO NOT sleep with pets once you start using the CHG.   CHG Shower Instructions:  Wash your face and private area with normal soap. If you choose to wash your hair, wash first with your normal shampoo.  After you use shampoo/soap, rinse your hair and body thoroughly to remove shampoo/soap residue.  Turn the water OFF and apply about 3 tablespoons (45 ml) of CHG soap to a CLEAN washcloth.  Apply CHG soap ONLY FROM YOUR NECK DOWN TO YOUR TOES (washing for 3-5 minutes)  DO NOT use CHG soap on face, private areas, open wounds, or sores.  Pay special attention to the area where your surgery is being performed.  If you are having back surgery, having someone wash your back for you may be helpful. Wait 2 minutes after CHG soap is applied, then you may rinse off the CHG soap.  Pat dry with a clean towel  Put on clean clothes/pajamas   If you choose to wear lotion, please use ONLY the CHG-compatible lotions that are listed below.  Additional instructions for the day of surgery: DO NOT APPLY any lotions, deodorants, cologne, or perfumes.   Do not bring valuables to the hospital. [facility] is not responsible for any belongings/valuables. Do not wear nail polish, gel polish, artificial nails, or any other type of covering on natural nails (fingers and toes) Do not wear jewelry or makeup Put on clean/comfortable clothes.  Please brush your teeth.  Ask your nurse before applying any prescription medications to the skin.     CHG Compatible Lotions   Aveeno Moisturizing lotion   Cetaphil Moisturizing Cream  Cetaphil Moisturizing Lotion  Clairol Herbal Essence Moisturizing Lotion, Dry Skin  Clairol Herbal Essence Moisturizing Lotion, Extra Dry Skin  Clairol Herbal Essence Moisturizing Lotion, Normal Skin  Curel Age Defying Therapeutic Moisturizing Lotion with Alpha Hydroxy  Curel Extreme Care Body Lotion  Curel Soothing Hands Moisturizing Hand Lotion  Curel Therapeutic Moisturizing Cream, Fragrance-Free  Curel Therapeutic Moisturizing Lotion, Fragrance-Free  Curel Therapeutic Moisturizing Lotion, Original Formula  Eucerin Daily Replenishing Lotion  Eucerin Dry Skin Therapy Plus Alpha Hydroxy Crme  Eucerin Dry Skin Therapy Plus Alpha Hydroxy Lotion  Eucerin Original Crme  Eucerin Original Lotion  Eucerin Plus Crme Eucerin Plus Lotion  Eucerin TriLipid Replenishing Lotion  Keri Anti-Bacterial Hand Lotion  Keri Deep Conditioning Original Lotion Dry Skin Formula Softly Scented  Keri Deep Conditioning Original Lotion, Fragrance Free Sensitive Skin Formula  Keri Lotion Fast [employer] Fragrance Free Sensitive Skin Formula  Keri Lotion Fast Absorbing Softly Scented Dry Skin Formula  Keri Original Lotion  Keri Skin Renewal Lotion Keri Silky Smooth Lotion  Keri Silky Smooth Sensitive Skin Lotion  Nivea Body Creamy Conditioning Oil  Nivea Body Extra Enriched Lotion  Nivea Body Original Lotion  Nivea Body Sheer Moisturizing Lotion Nivea Crme  Nivea Skin Firming Lotion  NutraDerm 30 Skin Lotion  NutraDerm Skin Lotion  NutraDerm Therapeutic Skin Cream  NutraDerm Therapeutic Skin Lotion  ProShield Protective Hand Cream  Provon moisturizing lotion  Please read over the following fact sheets that you were given.

## 2024-03-15 ENCOUNTER — Encounter (HOSPITAL_COMMUNITY)
Admission: RE | Admit: 2024-03-15 | Discharge: 2024-03-15 | Disposition: A | Discharge status: Home / Self Care | Source: Ambulatory Visit | Attending: Specialist | Admitting: Specialist

## 2024-03-15 ENCOUNTER — Ambulatory Visit (HOSPITAL_COMMUNITY)
Admission: RE | Admit: 2024-03-15 | Discharge: 2024-03-15 | Disposition: A | Discharge status: Home / Self Care | Source: Ambulatory Visit | Attending: Orthopedic Surgery | Admitting: Orthopedic Surgery

## 2024-03-15 ENCOUNTER — Encounter (HOSPITAL_COMMUNITY): Payer: Self-pay

## 2024-03-15 ENCOUNTER — Ambulatory Visit: Payer: Self-pay | Admitting: Orthopedic Surgery

## 2024-03-15 ENCOUNTER — Other Ambulatory Visit: Payer: Self-pay

## 2024-03-15 VITALS — BP 155/88 | HR 92 | Temp 98.4°F | Resp 16 | Ht 72.0 in | Wt 202.6 lb

## 2024-03-15 DIAGNOSIS — Z01818 Encounter for other preprocedural examination: Secondary | ICD-10-CM

## 2024-03-15 DIAGNOSIS — I10 Essential (primary) hypertension: Secondary | ICD-10-CM | POA: Insufficient documentation

## 2024-03-15 DIAGNOSIS — M48062 Spinal stenosis, lumbar region with neurogenic claudication: Secondary | ICD-10-CM | POA: Insufficient documentation

## 2024-03-15 DIAGNOSIS — M48061 Spinal stenosis, lumbar region without neurogenic claudication: Secondary | ICD-10-CM | POA: Diagnosis not present

## 2024-03-15 DIAGNOSIS — M419 Scoliosis, unspecified: Secondary | ICD-10-CM | POA: Diagnosis not present

## 2024-03-15 DIAGNOSIS — M51369 Other intervertebral disc degeneration, lumbar region without mention of lumbar back pain or lower extremity pain: Secondary | ICD-10-CM | POA: Diagnosis not present

## 2024-03-15 LAB — BASIC METABOLIC PANEL WITH GFR
Anion gap: 10 (ref 5–15)
BUN: 20 mg/dL (ref 8–23)
CO2: 27 mmol/L (ref 22–32)
Calcium: 9.4 mg/dL (ref 8.9–10.3)
Chloride: 102 mmol/L (ref 98–111)
Creatinine, Ser: 0.96 mg/dL (ref 0.61–1.24)
GFR, Estimated: >60 mL/min (ref >60)
Glucose, Bld: 112 mg/dL — ABNORMAL HIGH (ref 70–99)
Potassium: 4.4 mmol/L (ref 3.5–5.1)
Sodium: 139 mmol/L (ref 135–145)

## 2024-03-15 LAB — SURGICAL PCR SCREEN
MRSA, PCR: NEGATIVE
Staphylococcus aureus: NEGATIVE

## 2024-03-15 LAB — CBC
HCT: 53.7 % — ABNORMAL HIGH (ref 39.0–52.0)
Hemoglobin: 17.6 g/dL — ABNORMAL HIGH (ref 13.0–17.0)
MCH: 28.3 pg (ref 26.0–34.0)
MCHC: 32.8 g/dL (ref 30.0–36.0)
MCV: 86.3 fL (ref 80.0–100.0)
Platelets: 279 K/uL (ref 150–400)
RBC: 6.22 MIL/uL — ABNORMAL HIGH (ref 4.22–5.81)
RDW: 13.7 % (ref 11.5–15.5)
WBC: 10.4 K/uL (ref 4.0–10.5)
nRBC: 0 % (ref 0.0–0.2)

## 2024-03-15 NOTE — H&P (Signed)
 Jerry Ruiz is an 73 y.o. male.   Chief Complaint: back and leg pain HPI: Reason for Visit: (normal) review of test results (lumbar MRI); 2 months Location (Lower Extremity): lower back pain on the right; leg pain on the right, , ; right buttock Severity: pain level 10/10 Aggravating Factors: standing for ; walking for Associated Symptoms: weakness (RLE) Medications: Prednisone- day 5 and Oxycodone  No loss of bowel or bladder function he can sit and lean off to his left with some relief  Past Medical History:  Diagnosis Date   Arthritis    Bifascicular block    pvc's   Bladder stone    Headache    History of echocardiogram    a. Echo 12/15: EF 55-60%, no RWMA, mild LAE, trivial MR   History of echocardiogram    Echo 12/17: mod conc LVH, EF 55-60, no RWMA, Gr 1 DD, mild AI, calcified AV leaflets, trivial MR, mod LAE, normal RVSF, mild TR, no pericardial eff   HTN (hypertension)    Pneumonia    PVC's (premature ventricular contractions)    Skin cancer    melanoma bil ears   Sleep apnea    cpap    Past Surgical History:  Procedure Laterality Date   HEMORROIDECTOMY     implantable loop recorder implantation  11/29/2019   Medtronic Reveal Linq model LNQ 22 (SN [id number]) implanted by Dr Kelsie for further evaluation of palpitations and arrhythmia management   INGUINAL HERNIA REPAIR Bilateral 03/28/2020   Procedure: LAPAROSCOPIC BILATERAL INGUINAL HERNIA REPAIR WITH MESH;  Surgeon: Kinsinger, Herlene Righter, MD;  Location: [facility];  Service: General;  Laterality: Bilateral;   KNEE SURGERY     arthroscopic bil   MOHS SURGERY     right big toe     bone removed   SHOULDER SURGERY     rotator right    TONSILLECTOMY     VASECTOMY     1987    Family History  Problem Relation Age of Onset   Arrhythmia Mother    Hypertension Mother    Heart disease Mother    Parkinson's disease Father    Hypertension Father    Social History:  reports that he quit smoking about 52 years ago.  His smoking use included cigarettes. He started smoking about 57 years ago. He has never used smokeless tobacco. He reports current alcohol use. He reports that he does not use drugs.  Allergies:  Allergies  Allergen Reactions   Carvedilol  Other (See Comments)   Cephalexin Other (See Comments)   Losartan  Potassium Other (See Comments)   Amlodipine  Other (See Comments)    Low energy    Sulfa Antibiotics Rash, Hives and Itching   Current meds: meloxicam 15 mg tablet metoprolol  tartrate 25 mg tablet oxyCODONE  5 mg tablet predniSONE 5 mg tablets in a dose pack spironolactone  25 mg tablet tadalafiL 5 mg tablet testosterone  cypionate   Results for orders placed or performed during the hospital encounter of 03/15/24 (from the past 48 hours)  Surgical pcr screen     Status: None   Collection Time: 03/15/24 11:36 AM   Specimen: Nasal Mucosa; Nasal Swab  Result Value Ref Range   MRSA, PCR NEGATIVE NEGATIVE   Staphylococcus aureus NEGATIVE NEGATIVE    Comment: (NOTE) The Xpert SA Assay (FDA approved for NASAL specimens in patients 8 years of age and older), is one component of a comprehensive surveillance program. It is not intended to diagnose infection nor to guide or monitor  treatment. Performed at [facility], [address], [place], [place] [address]   Basic metabolic panel per protocol     Status: Abnormal   Collection Time: 03/15/24 12:00 PM  Result Value Ref Range   Sodium 139 135 - 145 mmol/L   Potassium 4.4 3.5 - 5.1 mmol/L   Chloride 102 98 - 111 mmol/L   CO2 27 22 - 32 mmol/L   Glucose, Bld 112 (H) 70 - 99 mg/dL    Comment: Glucose reference range applies only to samples taken after fasting for at least 8 hours.   BUN 20 8 - 23 mg/dL   Creatinine, Ser 9.03 0.61 - 1.24 mg/dL   Calcium 9.4 8.9 - 89.6 mg/dL   GFR, Estimated >39 >39 mL/min    Comment: (NOTE) Calculated using the CKD-EPI Creatinine Equation (2021)    Anion gap 10 5 - 15    Comment: Performed  at [facility], [address], [place], [place] [address]  CBC per protocol     Status: Abnormal   Collection Time: 03/15/24 12:00 PM  Result Value Ref Range   WBC 10.4 4.0 - 10.5 K/uL   RBC 6.22 (H) 4.22 - 5.81 MIL/uL   Hemoglobin 17.6 (H) 13.0 - 17.0 g/dL   HCT 46.2 (H) 60.9 - 47.9 %   MCV 86.3 80.0 - 100.0 fL   MCH 28.3 26.0 - 34.0 pg   MCHC 32.8 30.0 - 36.0 g/dL   RDW 86.2 88.4 - 84.4 %   Platelets 279 150 - 400 K/uL   nRBC 0.0 0.0 - 0.2 %    Comment: Performed at [facility], [address] [place], [place] [address]   No results found.  Review of Systems  Constitutional: Negative.   HENT: Negative.    Eyes: Negative.   Respiratory: Negative.    Cardiovascular: Negative.   Gastrointestinal: Negative.   Endocrine: Negative.   Genitourinary: Negative.   Musculoskeletal:  Positive for back pain and gait problem.  Skin: Negative.   Neurological:  Positive for weakness and numbness.  Psychiatric/Behavioral: Negative.      There were no vitals taken for this visit. Physical Exam Constitutional:      Appearance: Normal appearance.  HENT:     Head: Normocephalic and atraumatic.     Right Ear: External ear normal.     Left Ear: External ear normal.     Nose: Nose normal.     Mouth/Throat:     Pharynx: Oropharynx is clear.  Eyes:     Conjunctiva/sclera: Conjunctivae normal.  Cardiovascular:     Rate and Rhythm: Normal rate and regular rhythm.     Pulses: Normal pulses.     Heart sounds: Normal heart sounds.  Pulmonary:     Effort: Pulmonary effort is normal.     Breath sounds: Normal breath sounds.  Abdominal:     General: Bowel sounds are normal.  Musculoskeletal:     Cervical back: Normal range of motion and neck supple.     Comments: Tender right proximal gluteus. Straight leg raise low back buttock pain. Motor is 5/5 lower extremity sensory exam is intact  Skin:    General: Skin is warm and dry.  Neurological:     Mental Status: He is alert.     MRI of the lumbar spine 1. Moderate dextrocurvature of the lumbar spine with advanced multilevel 2. At 1.4-1.5, moderate right subarticular disc extrusion with cranial migration. This combined with a 5  mm synovial cyst along the anterior margin of the right articular facet results in moderate to severe lateral recess stenosis  Assessment/Plan Impression:  1. L4-L5 radiculopathy secondary to disc herniation L4-5 to the right small synovial cyst from the arthritic facet at L4-5 on the right with underlying multilevel disc degeneration  Plan:  We discussed options he may benefit from an L4 selective nerve root block but I am concerned that he would have persistent symptoms and therefore feel would be prudent to take steps towards consideration of a lumbar decompression L4-5 and a microdiscectomy.. Obtain preoperative clearance.  I discussed proceeding with an selective nerve root block at the level of the pathology and nerve root irritation. The Patient is in agreement. The procedure is performed under x-ray guidance. This is performed by Dr. Bonner in our facility or with a Radiologist in town. This will be scheduled after authorization has been secured from your insurance company. Hopefully the injection will be therapeutic however every situation is different. If the relief is only temporarily effective we discussed a repeat injection versus consideration of a surgical procedure performed at that level. It is important to maintain a pain journal to recall the effects of the injection even if limited. If complete relief of symptoms are obtained then follow-up is optional. If only temporary relief of your symptoms are obtained then I would recommend a follow-up consultation for further discussion. Monitor your blood glucose levels and supplement accordingly following the injection if you are a diabetic. In addition monitor your blood pressure if you are hypertensive.  When the steroid Dosepak  is completed meloxicam Patient was given a prescription for an anti-inflammatory or instructed to take over-the-counter anti-inflammatory medications. Side effects were discussed including potential elevation of blood pressure, long-term effect on the kidneys and liver, ulcer risks and therefore the need for appropriate monitoring if taken continually. That would include regular blood chemistries by a primary care physician to evaluate kidney function as well as liver function and monitor for potential gastrointestinal effects. My preference is to utilize anti-inflammatories periodically and preemptively to reduce inflammation on a short-term basis. This would decrease the risks associated with long-term use of those medications. In addition anti-inflammatory medications should be taken with a meal. They should not be taken if a blood thinner is being used or the patient has a history of peptic ulcer disease.  Patient is to call or present to the emergency room if there is any numbness or weakness to suggest worsening of their condition. In addition although rare if there is loss of bowel or bladder function such as incontinence or inability to void that this may represent a cauda equina syndrome. If noted the patient is to present immediately to the emergency room for evaluation and treatment otherwise permanent bowel or bladder dysfunction can occur as a result.  Follow-up after the injection. Or if he is worse in the interim  Plan central laminectomy L4-5, excision of synovial cyst, microdiscectomy  Darice CHRISTELLA Randy, PA-C for Dr Duwayne 03/15/2024, 2:39 PM

## 2024-03-15 NOTE — H&P (View-Only) (Signed)
 Jerry Ruiz is an 73 y.o. male.   Chief Complaint: back and leg pain HPI: Reason for Visit: (normal) review of test results (lumbar MRI); 2 months Location (Lower Extremity): lower back pain on the right; leg pain on the right, , ; right buttock Severity: pain level 10/10 Aggravating Factors: standing for ; walking for Associated Symptoms: weakness (RLE) Medications: Prednisone- day 5 and Oxycodone  No loss of bowel or bladder function he can sit and lean off to his left with some relief  Past Medical History:  Diagnosis Date   Arthritis    Bifascicular block    pvc's   Bladder stone    Headache    History of echocardiogram    a. Echo 12/15: EF 55-60%, no RWMA, mild LAE, trivial MR   History of echocardiogram    Echo 12/17: mod conc LVH, EF 55-60, no RWMA, Gr 1 DD, mild AI, calcified AV leaflets, trivial MR, mod LAE, normal RVSF, mild TR, no pericardial eff   HTN (hypertension)    Pneumonia    PVC's (premature ventricular contractions)    Skin cancer    melanoma bil ears   Sleep apnea    cpap    Past Surgical History:  Procedure Laterality Date   HEMORROIDECTOMY     implantable loop recorder implantation  11/29/2019   Medtronic Reveal Linq model LNQ 22 (SN [id number]) implanted by Dr Kelsie for further evaluation of palpitations and arrhythmia management   INGUINAL HERNIA REPAIR Bilateral 03/28/2020   Procedure: LAPAROSCOPIC BILATERAL INGUINAL HERNIA REPAIR WITH MESH;  Surgeon: Kinsinger, Herlene Righter, MD;  Location: [facility];  Service: General;  Laterality: Bilateral;   KNEE SURGERY     arthroscopic bil   MOHS SURGERY     right big toe     bone removed   SHOULDER SURGERY     rotator right    TONSILLECTOMY     VASECTOMY     1987    Family History  Problem Relation Age of Onset   Arrhythmia Mother    Hypertension Mother    Heart disease Mother    Parkinson's disease Father    Hypertension Father    Social History:  reports that he quit smoking about 52 years ago.  His smoking use included cigarettes. He started smoking about 57 years ago. He has never used smokeless tobacco. He reports current alcohol use. He reports that he does not use drugs.  Allergies:  Allergies  Allergen Reactions   Carvedilol  Other (See Comments)   Cephalexin Other (See Comments)   Losartan  Potassium Other (See Comments)   Amlodipine  Other (See Comments)    Low energy    Sulfa Antibiotics Rash, Hives and Itching   Current meds: meloxicam 15 mg tablet metoprolol  tartrate 25 mg tablet oxyCODONE  5 mg tablet predniSONE 5 mg tablets in a dose pack spironolactone  25 mg tablet tadalafiL 5 mg tablet testosterone  cypionate   Results for orders placed or performed during the hospital encounter of 03/15/24 (from the past 48 hours)  Surgical pcr screen     Status: None   Collection Time: 03/15/24 11:36 AM   Specimen: Nasal Mucosa; Nasal Swab  Result Value Ref Range   MRSA, PCR NEGATIVE NEGATIVE   Staphylococcus aureus NEGATIVE NEGATIVE    Comment: (NOTE) The Xpert SA Assay (FDA approved for NASAL specimens in patients 8 years of age and older), is one component of a comprehensive surveillance program. It is not intended to diagnose infection nor to guide or monitor  treatment. Performed at [facility], [address], [place], [place] [address]   Basic metabolic panel per protocol     Status: Abnormal   Collection Time: 03/15/24 12:00 PM  Result Value Ref Range   Sodium 139 135 - 145 mmol/L   Potassium 4.4 3.5 - 5.1 mmol/L   Chloride 102 98 - 111 mmol/L   CO2 27 22 - 32 mmol/L   Glucose, Bld 112 (H) 70 - 99 mg/dL    Comment: Glucose reference range applies only to samples taken after fasting for at least 8 hours.   BUN 20 8 - 23 mg/dL   Creatinine, Ser 9.03 0.61 - 1.24 mg/dL   Calcium 9.4 8.9 - 89.6 mg/dL   GFR, Estimated >39 >39 mL/min    Comment: (NOTE) Calculated using the CKD-EPI Creatinine Equation (2021)    Anion gap 10 5 - 15    Comment: Performed  at [facility], [address], [place], [place] [address]  CBC per protocol     Status: Abnormal   Collection Time: 03/15/24 12:00 PM  Result Value Ref Range   WBC 10.4 4.0 - 10.5 K/uL   RBC 6.22 (H) 4.22 - 5.81 MIL/uL   Hemoglobin 17.6 (H) 13.0 - 17.0 g/dL   HCT 46.2 (H) 60.9 - 47.9 %   MCV 86.3 80.0 - 100.0 fL   MCH 28.3 26.0 - 34.0 pg   MCHC 32.8 30.0 - 36.0 g/dL   RDW 86.2 88.4 - 84.4 %   Platelets 279 150 - 400 K/uL   nRBC 0.0 0.0 - 0.2 %    Comment: Performed at [facility], [address] [place], [place] [address]   No results found.  Review of Systems  Constitutional: Negative.   HENT: Negative.    Eyes: Negative.   Respiratory: Negative.    Cardiovascular: Negative.   Gastrointestinal: Negative.   Endocrine: Negative.   Genitourinary: Negative.   Musculoskeletal:  Positive for back pain and gait problem.  Skin: Negative.   Neurological:  Positive for weakness and numbness.  Psychiatric/Behavioral: Negative.      There were no vitals taken for this visit. Physical Exam Constitutional:      Appearance: Normal appearance.  HENT:     Head: Normocephalic and atraumatic.     Right Ear: External ear normal.     Left Ear: External ear normal.     Nose: Nose normal.     Mouth/Throat:     Pharynx: Oropharynx is clear.  Eyes:     Conjunctiva/sclera: Conjunctivae normal.  Cardiovascular:     Rate and Rhythm: Normal rate and regular rhythm.     Pulses: Normal pulses.     Heart sounds: Normal heart sounds.  Pulmonary:     Effort: Pulmonary effort is normal.     Breath sounds: Normal breath sounds.  Abdominal:     General: Bowel sounds are normal.  Musculoskeletal:     Cervical back: Normal range of motion and neck supple.     Comments: Tender right proximal gluteus. Straight leg raise low back buttock pain. Motor is 5/5 lower extremity sensory exam is intact  Skin:    General: Skin is warm and dry.  Neurological:     Mental Status: He is alert.     MRI of the lumbar spine 1. Moderate dextrocurvature of the lumbar spine with advanced multilevel 2. At 1.4-1.5, moderate right subarticular disc extrusion with cranial migration. This combined with a 5  mm synovial cyst along the anterior margin of the right articular facet results in moderate to severe lateral recess stenosis  Assessment/Plan Impression:  1. L4-L5 radiculopathy secondary to disc herniation L4-5 to the right small synovial cyst from the arthritic facet at L4-5 on the right with underlying multilevel disc degeneration  Plan:  We discussed options he may benefit from an L4 selective nerve root block but I am concerned that he would have persistent symptoms and therefore feel would be prudent to take steps towards consideration of a lumbar decompression L4-5 and a microdiscectomy.. Obtain preoperative clearance.  I discussed proceeding with an selective nerve root block at the level of the pathology and nerve root irritation. The Patient is in agreement. The procedure is performed under x-ray guidance. This is performed by Dr. Bonner in our facility or with a Radiologist in town. This will be scheduled after authorization has been secured from your insurance company. Hopefully the injection will be therapeutic however every situation is different. If the relief is only temporarily effective we discussed a repeat injection versus consideration of a surgical procedure performed at that level. It is important to maintain a pain journal to recall the effects of the injection even if limited. If complete relief of symptoms are obtained then follow-up is optional. If only temporary relief of your symptoms are obtained then I would recommend a follow-up consultation for further discussion. Monitor your blood glucose levels and supplement accordingly following the injection if you are a diabetic. In addition monitor your blood pressure if you are hypertensive.  When the steroid Dosepak  is completed meloxicam Patient was given a prescription for an anti-inflammatory or instructed to take over-the-counter anti-inflammatory medications. Side effects were discussed including potential elevation of blood pressure, long-term effect on the kidneys and liver, ulcer risks and therefore the need for appropriate monitoring if taken continually. That would include regular blood chemistries by a primary care physician to evaluate kidney function as well as liver function and monitor for potential gastrointestinal effects. My preference is to utilize anti-inflammatories periodically and preemptively to reduce inflammation on a short-term basis. This would decrease the risks associated with long-term use of those medications. In addition anti-inflammatory medications should be taken with a meal. They should not be taken if a blood thinner is being used or the patient has a history of peptic ulcer disease.  Patient is to call or present to the emergency room if there is any numbness or weakness to suggest worsening of their condition. In addition although rare if there is loss of bowel or bladder function such as incontinence or inability to void that this may represent a cauda equina syndrome. If noted the patient is to present immediately to the emergency room for evaluation and treatment otherwise permanent bowel or bladder dysfunction can occur as a result.  Follow-up after the injection. Or if he is worse in the interim  Plan central laminectomy L4-5, excision of synovial cyst, microdiscectomy  Darice CHRISTELLA Randy, PA-C for Dr Duwayne 03/15/2024, 2:39 PM

## 2024-03-15 NOTE — Progress Notes (Signed)
 PCP - Dr. Dorn Sauers Cardiologist - Dr. Mona EP: Dr. Cindie  PPM/ICD - Loop Recorder Device Orders - n/a Rep Notified - n/a  Chest x-ray - Lumbar spine @ PAT 03/15/24 EKG - 11/12/23 Stress Test - 05/31/22 ECHO - 10/28/21 Cardiac Cath - denies  Sleep Study - ~2020 CPAP - Used to use nightly, has not in the last month  Fasting Blood Sugar - n/a Checks Blood Sugar _____ times a day  Last dose of GLP1 agonist-  n/a GLP1 instructions:   Blood Thinner Instructions: n/a Aspirin Instructions:  ERAS Protcol - clears until 9:30 PRE-SURGERY Ensure or G2- Pre-surgery ensure provided   COVID TEST- n/a   Anesthesia review: Yes, Heart history  Patient denies shortness of breath, fever, cough and chest pain at PAT appointment   All instructions explained to the patient, with a verbal understanding of the material. Patient agrees to go over the instructions while at home for a better understanding.

## 2024-03-16 NOTE — Anesthesia Preprocedure Evaluation (Signed)
 Anesthesia Evaluation  Patient identified by MRN, date of birth, ID band Patient awake    Reviewed: Allergy & Precautions, NPO status , Patient's Chart, lab work & pertinent test results  Airway Mallampati: II  TM Distance: >3 FB Neck ROM: Full    Dental  (+) Teeth Intact, Dental Advisory Given   Pulmonary sleep apnea , former smoker   breath sounds clear to auscultation       Cardiovascular hypertension, Pt. on home beta blockers + dysrhythmias  Rhythm:Regular Rate:Normal  Echo:  1. Left ventricular ejection fraction, by estimation, is 60 to 65%. The  left ventricle has normal function. The left ventricle has no regional  wall motion abnormalities. There is moderate concentric left ventricular  hypertrophy. Left ventricular  diastolic parameters were normal.   2. Right ventricular systolic function is normal. The right ventricular  size is normal.   3. Left atrial size was mild to moderately dilated.   4. The mitral valve is normal in structure. Trivial mitral valve  regurgitation. No evidence of mitral stenosis.   5. The aortic valve is tricuspid. Aortic valve regurgitation is mild. No  aortic stenosis is present.   6. The inferior vena cava is normal in size with greater than 50%  respiratory variability, suggesting right atrial pressure of 3 mmHg.     Neuro/Psych  Headaches  negative psych ROS   GI/Hepatic negative GI ROS, Neg liver ROS,,,  Endo/Other  negative endocrine ROS    Renal/GU negative Renal ROS     Musculoskeletal  (+) Arthritis ,    Abdominal   Peds  Hematology negative hematology ROS (+)   Anesthesia Other Findings   Reproductive/Obstetrics                              Anesthesia Physical Anesthesia Plan  ASA: 2  Anesthesia Plan: General   Post-op Pain Management: Tylenol  PO (pre-op)*   Induction: Intravenous  PONV Risk Score and Plan: 3 and Ondansetron ,  Dexamethasone  and Midazolam   Airway Management Planned: Oral ETT  Additional Equipment: None  Intra-op Plan:   Post-operative Plan: Extubation in OR  Informed Consent: I have reviewed the patients History and Physical, chart, labs and discussed the procedure including the risks, benefits and alternatives for the proposed anesthesia with the patient or authorized representative who has indicated his/her understanding and acceptance.     Dental advisory given  Plan Discussed with: CRNA  Anesthesia Plan Comments: (PAT note written 03/16/2024 by Hagan Vanauken, PA-C.  )         Anesthesia Quick Evaluation

## 2024-03-16 NOTE — Progress Notes (Signed)
 Anesthesia Chart Review:  Case: [id number] Date/Time: 03/18/24 1222   Procedure: DECOMPRESSIVE LUMBAR LAMINECTOMY LEVEL 1 - Central laminectomy L4-5, exciscion of synovial cyst microdiscetomy   Anesthesia type: General   Diagnosis: Herniated nucleus pulposus of lumbosacral region [M51.27]   Pre-op diagnosis: Synovial cyst Herniated nucleus pulposus L4-5   [facility] ROOM [facility] / [facility]   Surgeons: Duwayne Purchase, MD       DISCUSSION: Patient is a 73 year old male scheduled for the above procedure.  History includes former smoker (quit 1973), HTN, dysrhythmia (atrial tach, PVCs; s/p Medtronic ILR 11/29/19), bifascicular block, elevated CAC (298, 65th percentile 2022), OSA (inconsistent CPAP use), melanoma (s/p excision), osteoarthritis, hernia (s/p bilateral IHR 03/28/20).   He is followed by cardiology for coronary calcifications (elevated CAC 2022), atrial tachycardia & PVCs (s/p ILR), HLD (declined medication therapy as of 11/12/23). He had telephonic cardiology evaluation on 02/25/24 with Loistine Sober, NP. According to the RCRI, patient has a 0.4-3.9% risk of MACE. Patient reports activity equivalent to 4.0 METS.    Given past medical history and time since last visit, based on ACC/AHA guidelines, Oak L Sylla would be at acceptable risk for the planned procedure without further cardiovascular testing. He had a normal/low risk PET CT cardiac perfusion study in 08/2022. TTE on 05/31/22 showed LVEF 60-65%, no RWMA, moderate concentric LVH, normal RV systolic function, mild-moderately dilated LA, trivial MR, mild AR.  ILR device interrogation as of 09/18/23 with Leverne Fuller, PA-C: Battery is good No arrhythmias PVC burden is 1% Symptom episode (one) reviewed, SR with intermittent PVCs No couplets, grouping Parameters reviewed HR histogram looks good.  He has not used CPAP in about a month.  Anesthesia team to evaluate on the day of surgery.    VS: BP (!) 155/88   Pulse 92    Temp 36.9 C   Resp 16   Ht 6' (1.829 m)   Wt 91.9 kg   SpO2 97%   BMI 27.48 kg/m    PROVIDERS: Charlott Dorn LABOR, MD is PCP  Mona Vinie BROCKS, MD is primary cardiologist Cindie Smalls, MD is EP cardiologist   LABS: Preoperative labs noted. H/H elevated at 17.6/53.7 and have been at least intermittently elevated to this level in the past (2020). Notes indicate he is no longer on testosterone  replacement therapy.  (all labs ordered are listed, but only abnormal results are displayed)  Labs Reviewed  BASIC METABOLIC PANEL WITH GFR - Abnormal; Notable for the following components:      Result Value   Glucose, Bld 112 (*)    All other components within normal limits  CBC - Abnormal; Notable for the following components:   RBC 6.22 (*)    Hemoglobin 17.6 (*)    HCT 53.7 (*)    All other components within normal limits  SURGICAL PCR SCREEN  A1c 6.2%, TSH 2.74, ALP 64, AST 17, ALT 18 on 06/03/23 ([facility])   IMAGES: Xray L-spine 03/15/24: IMPRESSION: 1. Multilevel degenerative disc disease and facet hypertrophy. 2. Broad-based dextroscoliotic curvature. 3. Pelvic tilt with left iliac crest 15 mm higher than right.   EKG: 11/12/23: Normal sinus rhythm Right bundle branch block Left anterior fascicular block Bifascicular block Minimal voltage criteria for LVH, may be normal variant ( R in aVL ) When compared with ECG of 20-May-2023 08:01, No significant change was found Confirmed by Mona Vinie [phone]) on 11/12/2023 10:01:07 AM   CV: NM PET CT Cardiac Perfusion Study 08/27/22:   The study is  normal. The study is low risk.   LV perfusion is normal. There is no evidence of ischemia. There is no evidence of infarction.   Rest left ventricular function is normal. Rest EF: 54 %. Stress left ventricular function is normal. Stress EF: 67 %. End diastolic cavity size is mildly enlarged. End systolic cavity size is normal. No evidence of transient ischemic dilation (TID)  noted.   Myocardial blood flow was computed to be 0.67ml/g/min at rest and 1.74ml/g/min at stress. Global myocardial blood flow reserve was 2.60 and was normal.   Coronary calcium was present on the attenuation correction CT images. Moderate coronary calcifications were present. Coronary calcifications were present in the left anterior descending artery, left circumflex artery and right coronary artery distribution(s). The previously described pericardial cyst appears unchanged in size and location compared to coronary calcium CT 06/2021, adjacent to the SVC/RA junction with average hounsfield units of 15.   Echo 05/31/22: IMPRESSIONS   1. Left ventricular ejection fraction, by estimation, is 60 to 65%. The  left ventricle has normal function. The left ventricle has no regional  wall motion abnormalities. There is moderate concentric left ventricular  hypertrophy. Left ventricular  diastolic parameters were normal.   2. Right ventricular systolic function is normal. The right ventricular  size is normal.   3. Left atrial size was mild to moderately dilated.   4. The mitral valve is normal in structure. Trivial mitral valve  regurgitation. No evidence of mitral stenosis.   5. The aortic valve is tricuspid. Aortic valve regurgitation is mild. No  aortic stenosis is present.   6. The inferior vena cava is normal in size with greater than 50%  respiratory variability, suggesting right atrial pressure of 3 mmHg.  - Comparison(s): No significant change from prior study.    CT Cardiac Calcium Scoring 06/29/21: IMPRESSION: 1. Coronary calcium score of 298. This was 65th percentile for age, gender, and race matched controls. 2.  Aortic atherosclerosis.   US  Carotid 09/24/19: Summary:  - Right Carotid: There is no evidence of stenosis in the right ICA. The extracranial vessels were near-normal with only minimal wall thickening or plaque.  - Left Carotid: There is no evidence of stenosis in the  left ICA. The extracranial vessels were near-normal with only minimal wall thickening or plaque.  - Vertebrals:  Bilateral vertebral arteries demonstrate antegrade flow.  - Subclavians: Normal flow hemodynamics were seen in bilateral subclavian arteries.    Past Medical History:  Diagnosis Date   Arthritis    Bifascicular block    pvc's   Bladder stone    Headache    History of echocardiogram    a. Echo 12/15: EF 55-60%, no RWMA, mild LAE, trivial MR   History of echocardiogram    Echo 12/17: mod conc LVH, EF 55-60, no RWMA, Gr 1 DD, mild AI, calcified AV leaflets, trivial MR, mod LAE, normal RVSF, mild TR, no pericardial eff   HTN (hypertension)    Pneumonia    PVC's (premature ventricular contractions)    Skin cancer    melanoma bil ears   Sleep apnea    cpap    Past Surgical History:  Procedure Laterality Date   HEMORROIDECTOMY     implantable loop recorder implantation  11/29/2019   Medtronic Reveal Linq model LNQ 22 (SN [id number]) implanted by Dr Kelsie for further evaluation of palpitations and arrhythmia management   INGUINAL HERNIA REPAIR Bilateral 03/28/2020   Procedure: LAPAROSCOPIC BILATERAL INGUINAL HERNIA REPAIR WITH MESH;  Surgeon: Stevie Herlene Righter, MD;  Location: [facility];  Service: General;  Laterality: Bilateral;   KNEE SURGERY     arthroscopic bil   MOHS SURGERY     right big toe     bone removed   SHOULDER SURGERY     rotator right    TONSILLECTOMY     VASECTOMY     1987    MEDICATIONS:  anastrozole (ARIMIDEX) 1 MG tablet   ascorbic acid  (VITAMIN C ) 500 MG tablet   FISH OIL-COENZYME Q10 PO   ibuprofen  (ADVIL ) 800 MG tablet   Inulin-Corn Fiber (BENEFIBER PREBIOTIC FIBER) CHEW   magnesium  oxide (MAG-OX) 400 (240 Mg) MG tablet   meloxicam (MOBIC) 15 MG tablet   methocarbamol  (ROBAXIN ) 750 MG tablet   metoprolol  succinate (TOPROL -XL) 50 MG 24 hr tablet   Multiple Vitamin (MULTIVITAMIN) capsule   oxyCODONE  (OXY IR/ROXICODONE ) 5 MG immediate  release tablet   spironolactone  (ALDACTONE ) 25 MG tablet   tadalafil (CIALIS) 5 MG tablet   No current facility-administered medications for this encounter.   Isaiah Ruder, PA-C Surgical Short Stay/Anesthesiology [facility] Phone [phone] [facility] Phone [phone] 03/16/2024 12:51 PM

## 2024-03-17 NOTE — Progress Notes (Signed)
 Patient was called to be informed that the surgery time for tomorrow was changed to 10:08 o'clock. Patient was instructed to be at the hospital at 08:10 o'clock and to drink the presurgical ensure at 07:00 o'clock. Patient verbalized understanding.

## 2024-03-18 ENCOUNTER — Ambulatory Visit (HOSPITAL_COMMUNITY)
Admission: RE | Admit: 2024-03-18 | Discharge: 2024-03-19 | Disposition: A | Discharge status: Home / Self Care | Attending: Specialist | Admitting: Specialist

## 2024-03-18 ENCOUNTER — Ambulatory Visit (HOSPITAL_COMMUNITY): Payer: Self-pay | Admitting: Vascular Surgery

## 2024-03-18 ENCOUNTER — Ambulatory Visit (HOSPITAL_COMMUNITY)

## 2024-03-18 ENCOUNTER — Encounter (HOSPITAL_COMMUNITY)
Admission: RE | Disposition: A | Discharge status: Home / Self Care | Payer: Self-pay | Source: Home / Self Care | Attending: Specialist

## 2024-03-18 ENCOUNTER — Ambulatory Visit (HOSPITAL_COMMUNITY): Admitting: Anesthesiology

## 2024-03-18 ENCOUNTER — Other Ambulatory Visit: Payer: Self-pay

## 2024-03-18 DIAGNOSIS — M5126 Other intervertebral disc displacement, lumbar region: Secondary | ICD-10-CM

## 2024-03-18 DIAGNOSIS — G4730 Sleep apnea, unspecified: Secondary | ICD-10-CM | POA: Diagnosis not present

## 2024-03-18 DIAGNOSIS — M4180 Other forms of scoliosis, site unspecified: Secondary | ICD-10-CM | POA: Diagnosis not present

## 2024-03-18 DIAGNOSIS — Z87891 Personal history of nicotine dependence: Secondary | ICD-10-CM | POA: Diagnosis not present

## 2024-03-18 DIAGNOSIS — M5116 Intervertebral disc disorders with radiculopathy, lumbar region: Secondary | ICD-10-CM | POA: Diagnosis not present

## 2024-03-18 DIAGNOSIS — M48061 Spinal stenosis, lumbar region without neurogenic claudication: Secondary | ICD-10-CM | POA: Diagnosis present

## 2024-03-18 DIAGNOSIS — I10 Essential (primary) hypertension: Secondary | ICD-10-CM | POA: Insufficient documentation

## 2024-03-18 DIAGNOSIS — M51369 Other intervertebral disc degeneration, lumbar region without mention of lumbar back pain or lower extremity pain: Secondary | ICD-10-CM | POA: Diagnosis not present

## 2024-03-18 DIAGNOSIS — M7138 Other bursal cyst, other site: Secondary | ICD-10-CM | POA: Insufficient documentation

## 2024-03-18 DIAGNOSIS — M5127 Other intervertebral disc displacement, lumbosacral region: Secondary | ICD-10-CM | POA: Diagnosis present

## 2024-03-18 DIAGNOSIS — M47816 Spondylosis without myelopathy or radiculopathy, lumbar region: Secondary | ICD-10-CM | POA: Diagnosis not present

## 2024-03-18 HISTORY — PX: DECOMPRESSIVE LUMBAR LAMINECTOMY LEVEL 1: SHX5791

## 2024-03-18 SURGERY — DECOMPRESSIVE LUMBAR LAMINECTOMY LEVEL 1
Anesthesia: General | Site: Spine Lumbar

## 2024-03-18 MED ORDER — OXYCODONE HCL 5 MG PO TABS
5.0000 mg | ORAL_TABLET | ORAL | Status: DC | PRN
  Administered 2024-03-19 (×3): 5 mg via ORAL
  Filled 2024-03-18: qty 1

## 2024-03-18 MED ORDER — HYDROMORPHONE HCL 1 MG/ML IJ SOLN
0.2500 mg | INTRAMUSCULAR | Status: DC | PRN
  Administered 2024-03-18 (×2): 0.25 mg via INTRAVENOUS
  Administered 2024-03-18 (×3): 0.5 mg via INTRAVENOUS

## 2024-03-18 MED ORDER — BUPIVACAINE-EPINEPHRINE 0.5% -1:200000 IJ SOLN
INTRAMUSCULAR | Status: DC | PRN
  Administered 2024-03-18: 5 mL

## 2024-03-18 MED ORDER — DEXAMETHASONE SODIUM PHOSPHATE 10 MG/ML IJ SOLN
INTRAMUSCULAR | Status: DC | PRN
  Administered 2024-03-18: 10 mg via INTRAVENOUS

## 2024-03-18 MED ORDER — ACETAMINOPHEN 650 MG RE SUPP: 650.0000 mg | RECTAL | Status: DC | PRN

## 2024-03-18 MED ORDER — LIDOCAINE 2% (20 MG/ML) 5 ML SYRINGE
INTRAMUSCULAR | Status: DC | PRN
  Administered 2024-03-18: 40 mg via INTRAVENOUS

## 2024-03-18 MED ORDER — LACTATED RINGERS IV SOLN: INTRAVENOUS | Status: DC

## 2024-03-18 MED ORDER — ROCURONIUM BROMIDE 10 MG/ML (PF) SYRINGE
PREFILLED_SYRINGE | INTRAVENOUS | Status: AC
  Filled 2024-03-18: qty 10

## 2024-03-18 MED ORDER — ONDANSETRON HCL 4 MG/2ML IJ SOLN
4.0000 mg | Freq: Four times a day (QID) | INTRAMUSCULAR | Status: DC | PRN
Start: 2024-03-18 — End: 2024-03-19

## 2024-03-18 MED ORDER — HYDROMORPHONE HCL 1 MG/ML IJ SOLN
INTRAMUSCULAR | Status: AC
  Filled 2024-03-18: qty 1

## 2024-03-18 MED ORDER — PROPOFOL 10 MG/ML IV BOLUS
INTRAVENOUS | Status: DC | PRN
  Administered 2024-03-18: 50 mg via INTRAVENOUS
  Administered 2024-03-18: 100 mg via INTRAVENOUS
  Administered 2024-03-18: 50 mg via INTRAVENOUS

## 2024-03-18 MED ORDER — PHENYLEPHRINE 80 MCG/ML (10ML) SYRINGE FOR IV PUSH (FOR BLOOD PRESSURE SUPPORT)
PREFILLED_SYRINGE | INTRAVENOUS | Status: AC
  Filled 2024-03-18: qty 10

## 2024-03-18 MED ORDER — ROCURONIUM BROMIDE 10 MG/ML (PF) SYRINGE
PREFILLED_SYRINGE | INTRAVENOUS | Status: DC | PRN
  Administered 2024-03-18: 10 mg via INTRAVENOUS
  Administered 2024-03-18: 50 mg via INTRAVENOUS
  Administered 2024-03-18: 20 mg via INTRAVENOUS
  Administered 2024-03-18: 10 mg via INTRAVENOUS

## 2024-03-18 MED ORDER — RISAQUAD PO CAPS
1.0000 | ORAL_CAPSULE | Freq: Every day | ORAL | Status: DC
  Administered 2024-03-18 – 2024-03-19 (×2): 1 via ORAL
  Filled 2024-03-18 (×2): qty 1

## 2024-03-18 MED ORDER — ACETAMINOPHEN 10 MG/ML IV SOLN
1000.0000 mg | INTRAVENOUS | Status: AC
  Administered 2024-03-18: 1000 mg via INTRAVENOUS
  Filled 2024-03-18: qty 100

## 2024-03-18 MED ORDER — ONDANSETRON HCL 4 MG PO TABS: 4.0000 mg | ORAL_TABLET | Freq: Four times a day (QID) | ORAL | Status: DC | PRN

## 2024-03-18 MED ORDER — LACTATED RINGERS IV SOLN: INTRAVENOUS | Status: DC | PRN

## 2024-03-18 MED ORDER — EPHEDRINE 5 MG/ML INJ
INTRAVENOUS | Status: AC
  Filled 2024-03-18: qty 5

## 2024-03-18 MED ORDER — OXYCODONE HCL 5 MG/5ML PO SOLN: 5.0000 mg | Freq: Once | ORAL | Status: DC | PRN

## 2024-03-18 MED ORDER — VITAMIN C 500 MG PO TABS
500.0000 mg | ORAL_TABLET | Freq: Every day | ORAL | Status: DC
Start: 2024-03-18 — End: 2024-03-19
  Administered 2024-03-18 – 2024-03-19 (×2): 500 mg via ORAL
  Filled 2024-03-18 (×2): qty 1

## 2024-03-18 MED ORDER — BISACODYL 5 MG PO TBEC
5.0000 mg | DELAYED_RELEASE_TABLET | Freq: Every day | ORAL | Status: DC | PRN
  Administered 2024-03-18: 5 mg via ORAL
  Filled 2024-03-18: qty 1

## 2024-03-18 MED ORDER — THROMBIN 20000 UNITS EX SOLR
CUTANEOUS | Status: DC | PRN
  Administered 2024-03-18: 20 mL via TOPICAL

## 2024-03-18 MED ORDER — ACETAMINOPHEN 10 MG/ML IV SOLN: 1000.0000 mg | Freq: Once | INTRAVENOUS | Status: DC | PRN

## 2024-03-18 MED ORDER — DOCUSATE SODIUM 100 MG PO CAPS
100.0000 mg | ORAL_CAPSULE | Freq: Two times a day (BID) | ORAL | Status: DC
  Administered 2024-03-18 – 2024-03-19 (×2): 100 mg via ORAL
  Filled 2024-03-18 (×2): qty 1

## 2024-03-18 MED ORDER — OXYCODONE HCL 5 MG PO TABS: 5.0000 mg | ORAL_TABLET | Freq: Once | ORAL | Status: DC | PRN

## 2024-03-18 MED ORDER — BENEFIBER PREBIOTIC FIBER PO CHEW: 1.0000 | CHEWABLE_TABLET | Freq: Every day | ORAL | Status: DC

## 2024-03-18 MED ORDER — METHOCARBAMOL 750 MG PO TABS
750.0000 mg | ORAL_TABLET | Freq: Three times a day (TID) | ORAL | Status: DC | PRN
  Administered 2024-03-18 – 2024-03-19 (×3): 750 mg via ORAL
  Filled 2024-03-18 (×3): qty 1

## 2024-03-18 MED ORDER — CHLORHEXIDINE GLUCONATE 0.12 % MT SOLN
15.0000 mL | Freq: Once | OROMUCOSAL | Status: DC
  Filled 2024-03-18: qty 15

## 2024-03-18 MED ORDER — PHENOL 1.4 % MT LIQD: 1.0000 | OROMUCOSAL | Status: DC | PRN

## 2024-03-18 MED ORDER — ONDANSETRON HCL 4 MG/2ML IJ SOLN
INTRAMUSCULAR | Status: DC | PRN
  Administered 2024-03-18: 4 mg via INTRAVENOUS

## 2024-03-18 MED ORDER — ACETAMINOPHEN 325 MG PO TABS
650.0000 mg | ORAL_TABLET | ORAL | Status: DC | PRN
Start: 2024-03-18 — End: 2024-03-19

## 2024-03-18 MED ORDER — ALBUMIN HUMAN 5 % IV SOLN: INTRAVENOUS | Status: DC | PRN

## 2024-03-18 MED ORDER — DEXAMETHASONE SODIUM PHOSPHATE 10 MG/ML IJ SOLN
INTRAMUSCULAR | Status: AC
  Filled 2024-03-18: qty 1

## 2024-03-18 MED ORDER — POLYETHYLENE GLYCOL 3350 17 G PO PACK: 17.0000 g | PACK | Freq: Every day | ORAL | Status: DC | PRN

## 2024-03-18 MED ORDER — MENTHOL 3 MG MT LOZG: 1.0000 | LOZENGE | OROMUCOSAL | Status: DC | PRN

## 2024-03-18 MED ORDER — CHLORHEXIDINE GLUCONATE 0.12 % MT SOLN
15.0000 mL | Freq: Once | OROMUCOSAL | Status: AC
Start: 2024-03-18 — End: 2024-03-18
  Administered 2024-03-18: 15 mL via OROMUCOSAL
  Filled 2024-03-18: qty 15

## 2024-03-18 MED ORDER — LIDOCAINE 2% (20 MG/ML) 5 ML SYRINGE
INTRAMUSCULAR | Status: AC
  Filled 2024-03-18: qty 5

## 2024-03-18 MED ORDER — METOPROLOL SUCCINATE ER 25 MG PO TB24
25.0000 mg | ORAL_TABLET | Freq: Every day | ORAL | Status: DC
  Administered 2024-03-19: 25 mg via ORAL
  Filled 2024-03-18: qty 1

## 2024-03-18 MED ORDER — PROPOFOL 10 MG/ML IV BOLUS
INTRAVENOUS | Status: AC
  Filled 2024-03-18: qty 20

## 2024-03-18 MED ORDER — PHENYLEPHRINE 80 MCG/ML (10ML) SYRINGE FOR IV PUSH (FOR BLOOD PRESSURE SUPPORT)
PREFILLED_SYRINGE | INTRAVENOUS | Status: DC | PRN
  Administered 2024-03-18 (×3): 160 ug via INTRAVENOUS

## 2024-03-18 MED ORDER — GENTAMICIN SULFATE 40 MG/ML IJ SOLN
2.0000 mg/kg | INTRAVENOUS | Status: AC
  Administered 2024-03-18: 170 mg via INTRAVENOUS
  Filled 2024-03-18: qty 4.25

## 2024-03-18 MED ORDER — ALUM & MAG HYDROXIDE-SIMETH 200-200-20 MG/5ML PO SUSP: 30.0000 mL | Freq: Four times a day (QID) | ORAL | Status: DC | PRN

## 2024-03-18 MED ORDER — SUGAMMADEX SODIUM 200 MG/2ML IV SOLN
INTRAVENOUS | Status: DC | PRN
  Administered 2024-03-18: 200 mg via INTRAVENOUS

## 2024-03-18 MED ORDER — ORAL CARE MOUTH RINSE: 15.0000 mL | Freq: Once | OROMUCOSAL | Status: AC

## 2024-03-18 MED ORDER — BUPIVACAINE-EPINEPHRINE (PF) 0.5% -1:200000 IJ SOLN
INTRAMUSCULAR | Status: AC
  Filled 2024-03-18: qty 30

## 2024-03-18 MED ORDER — 0.9 % SODIUM CHLORIDE (POUR BTL) OPTIME
TOPICAL | Status: DC | PRN
  Administered 2024-03-18: 1000 mL

## 2024-03-18 MED ORDER — HYDROMORPHONE HCL 1 MG/ML IJ SOLN: 1.0000 mg | INTRAMUSCULAR | Status: DC | PRN

## 2024-03-18 MED ORDER — OXYCODONE HCL 5 MG PO TABS
10.0000 mg | ORAL_TABLET | ORAL | Status: DC | PRN
  Administered 2024-03-18 (×2): 10 mg via ORAL
  Filled 2024-03-18 (×4): qty 2

## 2024-03-18 MED ORDER — TRANEXAMIC ACID-NACL 1000-0.7 MG/100ML-% IV SOLN
1000.0000 mg | INTRAVENOUS | Status: AC
  Administered 2024-03-18: 1000 mg via INTRAVENOUS
  Filled 2024-03-18: qty 100

## 2024-03-18 MED ORDER — FENTANYL CITRATE (PF) 250 MCG/5ML IJ SOLN
INTRAMUSCULAR | Status: AC
  Filled 2024-03-18: qty 5

## 2024-03-18 MED ORDER — KCL IN DEXTROSE-NACL 20-5-0.9 MEQ/L-%-% IV SOLN
INTRAVENOUS | Status: DC
  Filled 2024-03-18: qty 1000

## 2024-03-18 MED ORDER — SPIRONOLACTONE 25 MG PO TABS
25.0000 mg | ORAL_TABLET | Freq: Every day | ORAL | Status: DC
  Administered 2024-03-18 – 2024-03-19 (×2): 25 mg via ORAL
  Filled 2024-03-18 (×2): qty 1

## 2024-03-18 MED ORDER — DROPERIDOL 2.5 MG/ML IJ SOLN: 0.6250 mg | Freq: Once | INTRAMUSCULAR | Status: DC | PRN

## 2024-03-18 MED ORDER — VANCOMYCIN HCL IN DEXTROSE 1-5 GM/200ML-% IV SOLN
1000.0000 mg | Freq: Once | INTRAVENOUS | Status: AC
  Administered 2024-03-19: 1000 mg via INTRAVENOUS
  Filled 2024-03-18 (×2): qty 200

## 2024-03-18 MED ORDER — ORAL CARE MOUTH RINSE: 15.0000 mL | Freq: Once | OROMUCOSAL | Status: DC

## 2024-03-18 MED ORDER — EPHEDRINE SULFATE-NACL 50-0.9 MG/10ML-% IV SOSY
PREFILLED_SYRINGE | INTRAVENOUS | Status: DC | PRN
Start: 2024-03-18 — End: 2024-03-18
  Administered 2024-03-18: 10 mg via INTRAVENOUS

## 2024-03-18 MED ORDER — MEPERIDINE HCL 25 MG/ML IJ SOLN: 6.2500 mg | INTRAMUSCULAR | Status: DC | PRN

## 2024-03-18 MED ORDER — PHENYLEPHRINE HCL-NACL 20-0.9 MG/250ML-% IV SOLN
INTRAVENOUS | Status: DC | PRN
  Administered 2024-03-18: 30 ug/min via INTRAVENOUS

## 2024-03-18 MED ORDER — MAGNESIUM OXIDE -MG SUPPLEMENT 400 (240 MG) MG PO TABS: 400.0000 mg | ORAL_TABLET | Freq: Every day | ORAL | Status: DC | PRN

## 2024-03-18 MED ORDER — FENTANYL CITRATE (PF) 250 MCG/5ML IJ SOLN
INTRAMUSCULAR | Status: DC | PRN
  Administered 2024-03-18 (×5): 50 ug via INTRAVENOUS

## 2024-03-18 MED ORDER — THROMBIN 20000 UNITS EX SOLR
CUTANEOUS | Status: AC
Start: 2024-03-18 — End: 2024-03-18
  Filled 2024-03-18: qty 20000

## 2024-03-18 MED ORDER — VANCOMYCIN HCL 1500 MG/300ML IV SOLN
1500.0000 mg | INTRAVENOUS | Status: AC
  Administered 2024-03-18: 1500 mg via INTRAVENOUS
  Filled 2024-03-18: qty 300

## 2024-03-18 SURGICAL SUPPLY — 50 items
BAG COUNTER SPONGE SURGICOUNT (BAG) ×2 IMPLANT
BAG DECANTER FOR FLEXI CONT (MISCELLANEOUS) IMPLANT
BAND RUBBER #18 3X1/16 STRL (MISCELLANEOUS) ×4 IMPLANT
BUR EGG ELITE 5.0 (BURR) IMPLANT
BUR RND DIAMOND ELITE 4.0 (BURR) IMPLANT
CLEANER TIP ELECTROSURG 2X2 (MISCELLANEOUS) ×2 IMPLANT
CNTNR URN SCR LID CUP LEK RST (MISCELLANEOUS) ×2 IMPLANT
DRAPE LAPAROTOMY 100X72X124 (DRAPES) ×2 IMPLANT
DRAPE MICROSCOPE SLANT 54X150 (MISCELLANEOUS) ×2 IMPLANT
DRAPE SHEET LG 3/4 BI-LAMINATE (DRAPES) ×2 IMPLANT
DRAPE SURG 17X11 SM STRL (DRAPES) ×2 IMPLANT
DRAPE UTILITY XL STRL (DRAPES) ×2 IMPLANT
DRESSING AQUACEL AG SP 3.5X6 (GAUZE/BANDAGES/DRESSINGS) IMPLANT
DRSG AQUACEL AG ADV 3.5X 4 (GAUZE/BANDAGES/DRESSINGS) IMPLANT
DRSG AQUACEL AG ADV 3.5X 6 (GAUZE/BANDAGES/DRESSINGS) IMPLANT
DRSG TELFA 3X8 NADH STRL (GAUZE/BANDAGES/DRESSINGS) IMPLANT
DURAPREP 26ML APPLICATOR (WOUND CARE) ×2 IMPLANT
DURASEAL SPINE SEALANT 3ML (MISCELLANEOUS) IMPLANT
ELECTRODE BLDE 4.0 EZ CLN MEGD (MISCELLANEOUS) IMPLANT
ELECTRODE REM PT RTRN 9FT ADLT (ELECTROSURGICAL) ×2 IMPLANT
GLOVE BIOGEL PI IND STRL 7.5 (GLOVE) ×2 IMPLANT
GLOVE SURG SS PI 7.0 STRL IVOR (GLOVE) ×2 IMPLANT
GLOVE SURG SS PI 8.0 STRL IVOR (GLOVE) ×4 IMPLANT
GOWN STRL REUS W/ TWL LRG LVL3 (GOWN DISPOSABLE) ×2 IMPLANT
GOWN STRL REUS W/ TWL XL LVL3 (GOWN DISPOSABLE) ×2 IMPLANT
IV CATH 14GX2 1/4 (CATHETERS) ×2 IMPLANT
KIT BASIN OR (CUSTOM PROCEDURE TRAY) ×2 IMPLANT
NDL 22X1.5 STRL (OR ONLY) (MISCELLANEOUS) ×2 IMPLANT
NDL SPNL 18GX3.5 QUINCKE PK (NEEDLE) ×4 IMPLANT
NEEDLE 22X1.5 STRL (OR ONLY) (MISCELLANEOUS) ×1 IMPLANT
NEEDLE SPNL 18GX3.5 QUINCKE PK (NEEDLE) ×2 IMPLANT
PACK LAMINECTOMY NEURO (CUSTOM PROCEDURE TRAY) ×2 IMPLANT
PATTIES SURGICAL .75X.75 (GAUZE/BANDAGES/DRESSINGS) ×2 IMPLANT
SOLUTION PRONTOSAN WOUND 350ML (IRRIGATION / IRRIGATOR) IMPLANT
SPONGE SURGIFOAM ABS GEL 100 (HEMOSTASIS) ×2 IMPLANT
SPONGE T-LAP 4X18 ~~LOC~~+RFID (SPONGE) IMPLANT
STAPLER VISISTAT (STAPLE) IMPLANT
STRIP CLOSURE SKIN 1/2X4 (GAUZE/BANDAGES/DRESSINGS) ×2 IMPLANT
SUT NURALON 4 0 TR CR/8 (SUTURE) IMPLANT
SUT PROLENE 3 0 PS 2 (SUTURE) IMPLANT
SUT VIC AB 1 CT1 27XBRD ANTBC (SUTURE) IMPLANT
SUT VIC AB 1-0 CT2 27 (SUTURE) IMPLANT
SUT VIC AB 2-0 CT1 TAPERPNT 27 (SUTURE) IMPLANT
SUT VIC AB 2-0 CT2 27 (SUTURE) IMPLANT
SYR 3ML LL SCALE MARK (SYRINGE) ×2 IMPLANT
TOWEL GREEN STERILE (TOWEL DISPOSABLE) ×2 IMPLANT
TOWEL GREEN STERILE FF (TOWEL DISPOSABLE) ×2 IMPLANT
TRAY FOLEY MTR SLVR 16FR STAT (SET/KITS/TRAYS/PACK) ×2 IMPLANT
WIPE CHG 2% 2PK PREOPERATIVE (MISCELLANEOUS) ×2 IMPLANT
YANKAUER SUCT BULB TIP NO VENT (SUCTIONS) ×2 IMPLANT

## 2024-03-18 NOTE — Interval H&P Note (Signed)
 History and Physical Interval Note:  03/18/2024 9:09 AM  Jerry Ruiz  has presented today for surgery, with the diagnosis of Synovial cyst Herniated nucleus pulposus L4-5.  The various methods of treatment have been discussed with the patient and family. After consideration of risks, benefits and other options for treatment, the patient has consented to  Procedure(s) with comments: DECOMPRESSIVE LUMBAR LAMINECTOMY LEVEL 1 (N/A) - Central laminectomy L4-5, exciscion of synovial cyst microdiscetomy as a surgical intervention.  The patient's history has been reviewed, patient examined, no change in status, stable for surgery.  I have reviewed the patient's chart and labs.  Questions were answered to the patient's satisfaction.     Reyes [name]

## 2024-03-18 NOTE — Discharge Instructions (Signed)

## 2024-03-18 NOTE — Op Note (Signed)
 Jerry Ruiz, Jerry Ruiz MEDICAL RECORD NO: [id number] ACCOUNT NO: [id number] DATE OF BIRTH: 06/07/1951 FACILITY: [facility] LOCATION: [facility] PHYSICIAN: Reyes KYM Billing, MD  Operative Report   DATE OF PROCEDURE: 03/18/2024  PREOPERATIVE DIAGNOSES: 1.  Spinal stenosis L4-L5 bilaterally. 2.  Herniated disk L4-L5, right. 3.  Synovial cyst L4-L5, right. 4.  Degenerative scoliosis.  POSTOPERATIVE DIAGNOSES: 1.  Spinal stenosis L4-L5 bilaterally. 2.  Herniated disk L4-L5, right. 3.  Synovial cyst L4-L5, right. 4.  Degenerative scoliosis.  PROCEDURE PERFORMED: 1.  Central laminectomy L4-L5 with bilateral medial hemifacetectomies and foraminotomies L5 and L4 on the right. 2.  Microdiscectomy L4-L5, right. 3.  Excision of synovial cyst L4-L5, right.  ANESTHESIA:  General.  ASSISTANT:  Jaclyn Bissell, PA.  HISTORY:  A 73 year old male with right lower extremity radicular pain, L4-L5 nerve root distribution, quad weakness, EHL weakness due to severe lateral recess stenosis at L4-L5 bilaterally due to ligamentum flavum hypertrophy, facet hypertrophy,  degenerative scoliosis, and a disc herniation at L4-L5 migrating cephalad.  The patient was indicated for decompression at L4-L5.  He had a small synovial cyst on the right at L4-L5 as well.  He had mainly L4-L5 nerve root deficit on the right.  He was  indicated for decompression.  Risks and benefits were discussed including bleeding, infection, damage to neurovascular structures, no change in symptoms or worsening symptoms, DVT, PE, anesthetic complications, need for fusion in the future, etc.,  DESCRIPTION OF PROCEDURE:  The patient is in supine position.  After induction of adequate general anesthesia, vancomycin , gentamicin , the patient was placed prone on Wilson frame.  All bony prominences were well padded.  The lumbar region was prepped  and draped in the usual sterile fashion.  Two 18-gauge spinal needles were utilized to localize the L4-L5  interspace confirmed with x-ray.  Incision was made from above the spinous process of L4 to spinous process of L5.  Subcutaneous tissue was  dissected.  Electrocautery was utilized to achieve hemostasis.  The dorsal lumbar fascia was divided in line with the skin and paraspinous muscle elevated from lamina L4-L5 bilaterally.  Operating microscope was draped and brought into the surgical  field.  Confirmatory radiographs obtained.  Leksell was utilized to remove the spinous processes of L4 and L5.  There was shingling and hypertrophic ligamentum noted at L4-L5.  I used a high-speed burr to perform hemilaminotomies partially of the caudate  edge of L4.  Further completed with 3 mm Kerrison removing less than 25% of the facets bilaterally.  I then used a 2 mm Kerrison to continue the laminotomy cephalad first centrally to the point of cephalad attached to the ligamentum flavum.  I then used  a Woodson and neuro patties beneath the ligamentum flavum and developed a plane between the dura and the ligamentum.  I then sequentially began removing hypertrophic ligamentum flavum first centrally then to the left decompressing the lateral recess to  the medial border of the pedicle.I encountered the synovial cyst which was excised after freeing up adhesions to the thecal sac. It was approximately  a half a centimeter in size coming from the facet joint.This was typical synovial cyst tissue. I then performed a foraminotomy of L5.  We will protected the neural elements at all times and on the right, removed hypertrophic ligamentum, used a Woodson, neuro patties and a D'Errico.  I continued the decompression  laterally.  I mobilized the nerve root medially.  There was a hypertrophic venous plexus that was cauterized and divided.  A foraminotomy of L5 was performed.  Severe lateral recess stenosis was noted at L4-L5 extending up into the foramen of L4.  While  protecting the L5 root, I removed the portion of the superior  articulating process of L4 extending out into the foramen of L4 with hypertrophic ligamentum. A Woodson was utilized to protect the L4 root.  I performed the foraminotomy of L4, again removing  hypertrophic ligamentum and undercutting the facet.  There was ample pars remaining at L4.  I then identified the disc space.  There was a hypertrophic venous plexus noted, which was cauterized.  Small herniated fragment has migrated cephalad beneath  the thecal sac and underneath and out the foramen for this was evacuated and removed.  Very small fragment.  I then swept underneath the thecal sac out the foramen of L4 above to the pedicle of L4 for any residual fragments, which were not noted.  A  Woodson probe passed freely out the foramen of L4.  There was a venous plexus and epidural bleeding that was occurring down the foramen of L5.  We extended the foramen of L5, isolated the epidural venous plexus, and cauterized it. I used bone wax on the cancellous surfaces.  We required to place Gelfoam and then a patty on it and then wait for 5 minutes.  At that time, I had performed hemilaminotomies and cephalad edge of L5.  The [facility] probe passed freely out the foramen of L4 and L5 on the left and  above the pedicle of L4 on the left, below the pedicle of L5.  After this period of 5 minutes we removed the patty and the gel foam.  There was no active bleeding.  I placed a very small piece of Gelfoam over this plexus that was cauterized on the  lateral aspect of the foramen.  I obtained confirmatory radiographs with markers in the foramen of L4 and L5.  There was no disc herniation right at the disc level.  There is no disc herniation on the left.  I copiously irrigated and achieved strict  hemostasis with bipolar electrocautery.  No active bleeding or CSF leakage.  I felt there was a satisfactory decompression.  The foramen of L4 was severely stenotic as was the lateral recess on the right.  After the [facility] retractor  was removed, I copiously irrigated the paraspinous musculature.  Bipolar cautery was utilized to achieve hemostasis.  I then closed the dorsal lumbar fascia with 1-0 Vicryl interrupted figure-of-eight sutures, subcutaneous with 2-0 and skin with staples.  The wound was dressed sterilely then the patient was placed supine in the hospital, extubated without difficulty and transported to the recovery room in satisfactory condition.  The patient tolerated the procedure well.  No complications.  Assistant, Jaclyn Bissel, PA was used throughout the case for the patient positioning, gentle intermittent neural traction, suction, and closure.  BLOOD LOSS:  200 mL.   PUS D: 03/18/2024 12:42:15 pm T: 03/18/2024 3:56:00 pm  JOB: [id number]/ [id number]

## 2024-03-18 NOTE — Transfer of Care (Signed)
 Immediate Anesthesia Transfer of Care Note  Patient: Jerry Ruiz  Procedure(s) Performed: DECOMPRESSIVE LUMBAR LAMINECTOMY LEVEL LUMBAR FOUR-FIVE, EXCISION OF SYNOVIAL CYST MICRODISCETOMY (Spine Lumbar)  Patient Location: PACU  Anesthesia Type:General  Level of Consciousness: awake, drowsy, and patient cooperative  Airway & Oxygen Therapy: Patient Spontanous Breathing  Post-op Assessment: Report given to RN and Post -op Vital signs reviewed and stable  Post vital signs: Reviewed and stable  Last Vitals:  Vitals Value Taken Time  BP 154/87 03/18/24 12:46  Temp 36.8 C 03/18/24 12:45  Pulse 99 03/18/24 12:48  Resp 14 03/18/24 12:48  SpO2 94 % 03/18/24 12:48  Vitals shown include unfiled device data.  Pt drowsy, sleeping.   Patients Stated Pain Goal: 4 (03/18/24 0840)  Complications: No notable events documented.

## 2024-03-18 NOTE — Anesthesia Postprocedure Evaluation (Signed)
 Anesthesia Post Note  Patient: Jerry Ruiz  Procedure(s) Performed: DECOMPRESSIVE LUMBAR LAMINECTOMY LEVEL LUMBAR FOUR-FIVE, EXCISION OF SYNOVIAL CYST MICRODISCETOMY (Spine Lumbar)     Patient location during evaluation: PACU Anesthesia Type: General Level of consciousness: awake and alert Pain management: pain level controlled Vital Signs Assessment: post-procedure vital signs reviewed and stable Respiratory status: spontaneous breathing, nonlabored ventilation, respiratory function stable and patient connected to nasal cannula oxygen Cardiovascular status: blood pressure returned to baseline and stable Postop Assessment: no apparent nausea or vomiting Anesthetic complications: no   There were no known notable events for this encounter.  Last Vitals:  Vitals:   03/18/24 1430 03/18/24 1510  BP: (!) 161/92 (!) 158/98  Pulse: 89 100  Resp: 14 18  Temp: 36.8 C 36.7 C  SpO2: 96% 98%    Last Pain:  Vitals:   03/18/24 1510  TempSrc: Oral  PainSc:                  Corie Allis D Terika Pillard

## 2024-03-18 NOTE — Anesthesia Procedure Notes (Signed)
 Procedure Name: Intubation Date/Time: 03/18/2024 10:06 AM  Performed by: Oley Aleck LABOR, CRNAPre-anesthesia Checklist: Patient identified, Emergency Drugs available, Suction available and Patient being monitored Patient Re-evaluated:Patient Re-evaluated prior to induction Oxygen Delivery Method: Circle system utilized Preoxygenation: Pre-oxygenation with 100% oxygen Induction Type: IV induction Ventilation: Mask ventilation without difficulty Laryngoscope Size: Mac and 4 Grade View: Grade III Tube type: Oral Tube size: 8.0 mm Number of attempts: 1 Airway Equipment and Method: Stylet Placement Confirmation: ETT inserted through vocal cords under direct vision, positive ETCO2 and breath sounds checked- equal and bilateral Secured at: 23 cm Tube secured with: Tape Dental Injury: Teeth and Oropharynx as per pre-operative assessment  Comments: Intubation by c. Oley, Scientist, clinical (histocompatibility and immunogenetics); ebbs

## 2024-03-18 NOTE — Brief Op Note (Signed)
 03/18/2024  9:10 AM  PATIENT:  Madeleine LITTIE Arenas  73 y.o. male  PRE-OPERATIVE DIAGNOSIS:  Synovial cyst Herniated nucleus pulposus L4-5  POST-OPERATIVE DIAGNOSIS:  * No post-op diagnosis entered *  PROCEDURE:  Procedure(s) with comments: DECOMPRESSIVE LUMBAR LAMINECTOMY LEVEL 1 (N/A) - Central laminectomy L4-5, exciscion of synovial cyst microdiscetomy  SURGEON:  Surgeons and Role:    DEWAINE Duwayne Purchase, MD - Primary  PHYSICIAN ASSISTANT:   ASSISTANTS: Bissell   ANESTHESIA:   general  EBL:  200   BLOOD ADMINISTERED:none  DRAINS: none   LOCAL MEDICATIONS USED:  MARCAINE      SPECIMEN:  No Specimen  DISPOSITION OF SPECIMEN:  N/A  COUNTS:  YES     DICTATION: .Other Dictation: Dictation Number [id number]  PLAN OF CARE: Admit for overnight observation  PATIENT DISPOSITION:  PACU - hemodynamically stable.   Delay start of Pharmacological VTE agent (>24hrs) due to surgical blood loss or risk of bleeding: yes

## 2024-03-19 ENCOUNTER — Encounter (HOSPITAL_COMMUNITY): Payer: Self-pay | Admitting: Specialist

## 2024-03-19 DIAGNOSIS — M48061 Spinal stenosis, lumbar region without neurogenic claudication: Secondary | ICD-10-CM | POA: Diagnosis not present

## 2024-03-19 LAB — CBC
HCT: 44.1 % (ref 39.0–52.0)
Hemoglobin: 15.1 g/dL (ref 13.0–17.0)
MCH: 29 pg (ref 26.0–34.0)
MCHC: 34.2 g/dL (ref 30.0–36.0)
MCV: 84.8 fL (ref 80.0–100.0)
Platelets: 246 K/uL (ref 150–400)
RBC: 5.2 MIL/uL (ref 4.22–5.81)
RDW: 13.6 % (ref 11.5–15.5)
WBC: 13.9 K/uL — ABNORMAL HIGH (ref 4.0–10.5)
nRBC: 0 % (ref 0.0–0.2)

## 2024-03-19 LAB — BASIC METABOLIC PANEL WITH GFR
Anion gap: 13 (ref 5–15)
BUN: 16 mg/dL (ref 8–23)
CO2: 22 mmol/L (ref 22–32)
Calcium: 9.1 mg/dL (ref 8.9–10.3)
Chloride: 103 mmol/L (ref 98–111)
Creatinine, Ser: 0.97 mg/dL (ref 0.61–1.24)
GFR, Estimated: >60 mL/min (ref >60)
Glucose, Bld: 124 mg/dL — ABNORMAL HIGH (ref 70–99)
Potassium: 4.1 mmol/L (ref 3.5–5.1)
Sodium: 138 mmol/L (ref 135–145)

## 2024-03-19 MED ORDER — METHOCARBAMOL 500 MG PO TABS
500.0000 mg | ORAL_TABLET | Freq: Three times a day (TID) | ORAL | 1 refills | Status: DC | PRN
Start: 2024-03-19 — End: 2024-05-31

## 2024-03-19 MED ORDER — POLYETHYLENE GLYCOL 3350 17 G PO PACK: 17.0000 g | PACK | Freq: Every day | ORAL | 0 refills | Status: DC

## 2024-03-19 MED ORDER — DOCUSATE SODIUM 100 MG PO CAPS: 100.0000 mg | ORAL_CAPSULE | Freq: Two times a day (BID) | ORAL | 2 refills | Status: DC

## 2024-03-19 MED ORDER — OXYCODONE HCL 5 MG PO TABS: 5.0000 mg | ORAL_TABLET | ORAL | 0 refills | Status: DC | PRN

## 2024-03-19 NOTE — Discharge Summary (Signed)
 Physician Discharge Summary   Patient ID: Jerry Ruiz MRN: [id number] DOB/AGE: [birth year] 73 y.o.  Admit date: 03/18/2024 Discharge date: 03/19/2024  Primary Diagnosis:   Synovial cyst Herniated nucleus pulposus L4-5  Admission Diagnoses:  Past Medical History:  Diagnosis Date   Arthritis    Bifascicular block    pvc's   Bladder stone    Headache    History of echocardiogram    a. Echo 12/15: EF 55-60%, no RWMA, mild LAE, trivial MR   History of echocardiogram    Echo 12/17: mod conc LVH, EF 55-60, no RWMA, Gr 1 DD, mild AI, calcified AV leaflets, trivial MR, mod LAE, normal RVSF, mild TR, no pericardial eff   HTN (hypertension)    Pneumonia    PVC's (premature ventricular contractions)    Skin cancer    melanoma bil ears   Sleep apnea    cpap   Discharge Diagnoses:   Principal Problem:   Spinal stenosis at L4-L5 level  Procedure:  Procedure(s) (LRB): DECOMPRESSIVE LUMBAR LAMINECTOMY LEVEL LUMBAR FOUR-FIVE, EXCISION OF SYNOVIAL CYST MICRODISCETOMY (N/A)   Consults: None  HPI:  See H&P    Laboratory Data: Hospital Outpatient Visit on 03/15/2024  Component Date Value Ref Range Status   MRSA, PCR 03/15/2024 NEGATIVE  NEGATIVE Final   Staphylococcus aureus 03/15/2024 NEGATIVE  NEGATIVE Final   Comment: (NOTE) The Xpert SA Assay (FDA approved for NASAL specimens in patients 49 years of age and older), is one component of a comprehensive surveillance program. It is not intended to diagnose infection nor to guide or monitor treatment. Performed at [facility], [address] [place], [place] [address]    Sodium 03/15/2024 139  135 - 145 mmol/L Final   Potassium 03/15/2024 4.4  3.5 - 5.1 mmol/L Final   Chloride 03/15/2024 102  98 - 111 mmol/L Final   CO2 03/15/2024 27  22 - 32 mmol/L Final   Glucose, Bld 03/15/2024 112 (H)  70 - 99 mg/dL Final   Glucose reference range applies only to samples taken after fasting for at least 8 hours.   BUN 03/15/2024  20  8 - 23 mg/dL Final   Creatinine, Ser 03/15/2024 0.96  0.61 - 1.24 mg/dL Final   Calcium 92/92/7974 9.4  8.9 - 10.3 mg/dL Final   GFR, Estimated 03/15/2024 >60  >60 mL/min Final   Comment: (NOTE) Calculated using the CKD-EPI Creatinine Equation (2021)    Anion gap 03/15/2024 10  5 - 15 Final   Performed at [facility], [address], [place], [place] [address]   WBC 03/15/2024 10.4  4.0 - 10.5 K/uL Final   RBC 03/15/2024 6.22 (H)  4.22 - 5.81 MIL/uL Final   Hemoglobin 03/15/2024 17.6 (H)  13.0 - 17.0 g/dL Final   HCT 92/92/7974 53.7 (H)  39.0 - 52.0 % Final   MCV 03/15/2024 86.3  80.0 - 100.0 fL Final   MCH 03/15/2024 28.3  26.0 - 34.0 pg Final   MCHC 03/15/2024 32.8  30.0 - 36.0 g/dL Final   RDW 92/92/7974 13.7  11.5 - 15.5 % Final   Platelets 03/15/2024 279  150 - 400 K/uL Final   nRBC 03/15/2024 0.0  0.0 - 0.2 % Final   Performed at [facility], [address], [place], [place] [address]   No results for input(s): HGB in the last 72 hours. No results for input(s): WBC, RBC, HCT, PLT in the last 72 hours. No results for input(s):  NA, K, CL, CO2, BUN, CREATININE, GLUCOSE, CALCIUM in the last 72 hours. No results for input(s): LABPT, INR in the last 72 hours.  X-Rays:DG Lumbar Spine 2-3 Views Result Date: 03/18/2024 CLINICAL DATA:  Elective lumbar laminectomy at L4-L5 and excision of synovial cyst with micro discectomy. EXAM: LUMBAR SPINE - 2-3 VIEW COMPARISON:  Prior lumbar spine radiographs 01/30/2024 FINDINGS: Cross-table lateral radiographs are submitted for review. The first image demonstrates needle markers directed toward the L5 and L3 spinous process. Subsequent images demonstrate the soft tissue retractors successfully placed at the L4-L5 level. On the final image, surgical instruments are present at the L4 and L5 lamina. Multilevel degenerative disc disease and bilateral facet arthropathy. IMPRESSION: In progress lumbar spine  surgery as above. Successful localization of the L4 and L5 lamina. Electronically Signed   By: Wilkie Lent M.D.   On: 03/18/2024 14:32   DG Lumbar Spine 2-3 Views Result Date: 03/15/2024 CLINICAL DATA:  Herniated nucleus polyposis, spinal stenosis. Neurogenic claudication. Preop. EXAM: LUMBAR SPINE - 2-3 VIEW COMPARISON:  Radiograph 01/30/2024 FINDINGS: There are 5 non-rib-bearing lumbar vertebra. Broad-based dextroscoliotic curvature. Straightening of normal lordosis. Multilevel degenerative disc disease and facet hypertrophy. No fracture or compression deformity. There is pelvic tilt with left iliac crest 15 mm higher than right. IMPRESSION: 1. Multilevel degenerative disc disease and facet hypertrophy. 2. Broad-based dextroscoliotic curvature. 3. Pelvic tilt with left iliac crest 15 mm higher than right. Electronically Signed   By: Andrea Gasman M.D.   On: 03/15/2024 15:01   CUP PACEART REMOTE DEVICE CHECK Result Date: 02/19/2024 ILR summary report received. Battery status OK. Normal device function. No new symptom, tachy, brady, or pause episodes. No new AF episodes. 3.1% PVCs, significant incresase in PVCs according to trending graph.  Sent to triage, loop was for palpitations.   Monthly summary reports and ROV/PRN ML, CVRS ILR summary report received. Battery status OK. Normal device function. No new symptom, tachy, brady, or pause episodes. No new AF episodes. 3.1% PVCs, significant incresase in PVCs according to trending graph.  Sent to triage, loop was for palpitations.   Monthly summary reports and ROV/PRN ML, CVRS   EKG: Orders placed or performed in visit on 11/12/23   EKG 12-Lead     Hospital Course: Patient was admitted to [facility] and taken to the OR and underwent the above state procedure without complications.  Patient tolerated the procedure well and was later transferred to the recovery room and then to the orthopaedic floor for postoperative care.  They were  given PO and IV analgesics for pain control following their surgery.  They were given 24 hours of postoperative antibiotics.   PT was consulted postop to assist with mobility and transfers.  The patient was allowed to be WBAT with therapy and was taught back precautions. Discharge planning was consulted to help with postop disposition and equipment needs.  Patient had a good night on the evening of surgery and started to get up OOB with therapy on day one. Patient was seen in rounds and was ready to go home on day one.  They were given discharge instructions and dressing directions.  They were instructed on when to follow up in the office with Dr. Duwayne.   Diet: Regular diet Activity:WBAT, LSpine precautions Follow-up:in 10-14 days Disposition - Home Discharged Condition: good   Discharge Instructions     Call MD / Call 911   Complete by: As directed    If you experience chest pain or shortness  of breath, CALL 911 and be transported to the hospital emergency room.  If you develope a fever above 101 F, pus (white drainage) or increased drainage or redness at the wound, or calf pain, call your surgeon's office.   Constipation Prevention   Complete by: As directed    Drink plenty of fluids.  Prune juice may be helpful.  You may use a stool softener, such as Colace (over the counter) 100 mg twice a day.  Use MiraLax  (over the counter) for constipation as needed.   Diet - low sodium heart healthy   Complete by: As directed    Increase activity slowly as tolerated   Complete by: As directed    Post-operative opioid taper instructions:   Complete by: As directed    POST-OPERATIVE OPIOID TAPER INSTRUCTIONS: It is important to wean off of your opioid medication as soon as possible. If you do not need pain medication after your surgery it is ok to stop day one. Opioids include: Codeine, Hydrocodone(Norco, Vicodin), Oxycodone (Percocet, oxycontin ) and hydromorphone  amongst others.  Long term and even  short term use of opiods can cause: Increased pain response Dependence Constipation Depression Respiratory depression And more.  Withdrawal symptoms can include Flu like symptoms Nausea, vomiting And more Techniques to manage these symptoms Hydrate well Eat regular healthy meals Stay active Use relaxation techniques(deep breathing, meditating, yoga) Do Not substitute Alcohol to help with tapering If you have been on opioids for less than two weeks and do not have pain than it is ok to stop all together.  Plan to wean off of opioids This plan should start within one week post op of your joint replacement. Maintain the same interval or time between taking each dose and first decrease the dose.  Cut the total daily intake of opioids by one tablet each day Next start to increase the time between doses. The last dose that should be eliminated is the evening dose.         Allergies as of 03/19/2024       Reactions   Carvedilol  Other (See Comments)   Cephalexin Other (See Comments)   Losartan  Potassium Other (See Comments)   Amlodipine  Other (See Comments)   Low energy    Sulfa Antibiotics Rash, Hives, Itching        Medication List     STOP taking these medications    FISH OIL-COENZYME Q10 PO   ibuprofen  800 MG tablet Commonly known as: ADVIL    meloxicam 15 MG tablet Commonly known as: MOBIC   multivitamin capsule       TAKE these medications    anastrozole 1 MG tablet Commonly known as: ARIMIDEX Take 1 mg by mouth once a week.   ascorbic acid  500 MG tablet Commonly known as: VITAMIN C  Take 500 mg by mouth daily.   Benefiber Prebiotic Fiber Chew Chew 1 tablet by mouth daily.   docusate sodium  100 MG capsule Commonly known as: Colace Take 1 capsule (100 mg total) by mouth 2 (two) times daily.   magnesium  oxide 400 (240 Mg) MG tablet Commonly known as: MAG-OX Take 400 mg by mouth daily as needed (constipation).   methocarbamol  500 MG  tablet Commonly known as: ROBAXIN  Take 1 tablet (500 mg total) by mouth every 8 (eight) hours as needed for muscle spasms. What changed:  medication strength how much to take   metoprolol  succinate 50 MG 24 hr tablet Commonly known as: TOPROL -XL Take 25 mg by mouth daily. Take with or immediately  following a meal.   oxyCODONE  5 MG immediate release tablet Commonly known as: Oxy IR/ROXICODONE  Take 1-2 tablets (5-10 mg total) by mouth every 4 (four) hours as needed for severe pain (pain score 7-10). What changed:  how much to take when to take this reasons to take this   polyethylene glycol 17 g packet Commonly known as: MIRALAX  / GLYCOLAX  Take 17 g by mouth daily.   spironolactone  25 MG tablet Commonly known as: ALDACTONE  Take 1 tablet (25 mg total) by mouth daily.   tadalafil 5 MG tablet Commonly known as: CIALIS Take 5 mg by mouth daily.        Follow-up Information     Duwayne Purchase, MD Follow up in 2 week(s).   Specialty: Orthopedic Surgery Contact information: [address] [place] [address] [place] [address] [phone]                 Signed: Ruben Mahler, PA-C Orthopaedic Surgery 03/19/2024, 8:01 AM

## 2024-03-19 NOTE — Evaluation (Signed)
 Physical Therapy Brief Evaluation and Discharge Note Patient Details Name: Jerry Ruiz MRN: [id number] DOB: 06/15/1951 Today's Date: 03/19/2024   History of Present Illness  Pt is a 73 y.o. M who presents s/p decompressive lumbar laminectomy L4-5 and excision of synovial cyst. Significant PMH: HTN, SVT.  Clinical Impression  Patient evaluated by Physical Therapy with no further acute PT needs identified. Pt reports improvement in RLE radicular pain and demonstrates 5/5 strength. Pt overall is mobilizing fairly, ambulating 200 ft with a RW and supervision. Negotiated 3 steps to simulate home set up. All education has been completed and the patient has no further questions. No follow-up Physical Therapy or equipment needs. PT is signing off. Thank you for this referral.      PT Assessment Patient does not need any further PT services  Assistance Needed at Discharge  PRN    Equipment Recommendations Rolling walker (2 wheels)  Recommendations for Other Services       Precautions/Restrictions Precautions Precautions: Back Precaution Booklet Issued: Yes (comment) Recall of Precautions/Restrictions: Intact Required Braces or Orthoses: Spinal Brace Spinal Brace: Lumbar corset;Applied in sitting position Restrictions Weight Bearing Restrictions Per Provider Order: No        Mobility  Bed Mobility       General bed mobility comments: Sitting EOB upon arrival  Transfers Overall transfer level: Modified independent Equipment used: Rolling walker (2 wheels)                    Ambulation/Gait Ambulation/Gait assistance: Supervision Gait Distance (Feet): 200 Feet Assistive device: Rolling walker (2 wheels) Gait Pattern/deviations: Step-through pattern, Decreased stride length   General Gait Details: Min cues for posture  Home Activity Instructions    Stairs Stairs: Yes Stairs assistance: Supervision Stair Management: One rail Left Number of Stairs: 3 General  stair comments: Good technique  Modified Rankin (Stroke Patients Only)        Balance Overall balance assessment: Mild deficits observed, not formally tested                        Pertinent Vitals/Pain PT - Brief Vital Signs All Vital Signs Stable: Yes Pain Assessment Pain Assessment: Faces Faces Pain Scale: Hurts little more Pain Location: back, RLE radicular pain Pain Descriptors / Indicators: Grimacing, Operative site guarding Pain Intervention(s): Monitored during session     Home Living Family/patient expects to be discharged to:: Private residence Living Arrangements: Spouse/significant other Available Help at Discharge: Family;Available 24 hours/day Home Environment: Stairs to enter  [employer] of Steps: 3 Home Equipment: Standard Walker;Shower seat - built in;Hand held shower head        Prior Function Level of Independence: Independent with assistive device(s)      UE/LE Assessment   UE ROM/Strength/Tone/Coordination: WFL    LE ROM/Strength/Tone/Coordination: [facility]      Communication   Communication Communication: No apparent difficulties     Cognition Overall Cognitive Status: Appears within functional limits for tasks assessed/performed       General Comments      Exercises     Assessment/Plan    PT Problem List         PT Visit Diagnosis Unsteadiness on feet (R26.81);Pain    No Skilled PT All education completed;Patient at baseline level of functioning;Patient will have necessary level of assist by caregiver at discharge;Patient is supervision for all activity/mobility   Co-evaluation  AMPAC 6 Clicks Help needed turning from your back to your side while in a flat bed without using bedrails?: None Help needed moving from lying on your back to sitting on the side of a flat bed without using bedrails?: None Help needed moving to and from a bed to a chair (including a wheelchair)?: None Help needed  standing up from a chair using your arms (e.g., wheelchair or bedside chair)?: None Help needed to walk in hospital room?: A Little Help needed climbing 3-5 steps with a railing? : A Little 6 Click Score: 22      End of Session Equipment Utilized During Treatment: Gait belt Activity Tolerance: Patient tolerated treatment well Patient left: in bed;with call bell/phone within reach;with family/visitor present Nurse Communication: Mobility status PT Visit Diagnosis: Unsteadiness on feet (R26.81);Pain Pain - part of body:  (back)     Time: 9166-9142 PT Time Calculation (min) (ACUTE ONLY): 24 min  Charges:   PT Evaluation $PT Eval Low Complexity: 1 Low      [name], PT, DPT Acute Rehabilitation Services Office [phone]   [name]  03/19/2024, 9:37 AM

## 2024-03-19 NOTE — Evaluation (Signed)
 Occupational Therapy Evaluation Patient Details Name: Jerry Ruiz MRN: [id number] DOB: [birth year] Today's Date: 03/19/2024   History of Present Illness   Pt is a 73 y.o. M who presents s/p decompressive lumbar laminectomy L4-5 and excision of synovial cyst. Significant PMH: HTN, SVT.     Clinical Impressions Patient evaluated by Occupational Therapy with no further acute OT needs identified. All education has been completed and the patient has no further questions. See below for any follow-up Occupational Therapy or equipment needs. OT to sign off. Thank you for referral.       If plan is discharge home, recommend the following:         Functional Status Assessment   Patient has had a recent decline in their functional status and demonstrates the ability to make significant improvements in function in a reasonable and predictable amount of time.     Equipment Recommendations   None recommended by OT     Recommendations for Other Services         Precautions/Restrictions   Precautions Precautions: Back Precaution Booklet Issued: Yes (comment) Recall of Precautions/Restrictions: Intact Precaution/Restrictions Comments: no brace in room, did educate on don doff incase pt is issued a brace Restrictions Weight Bearing Restrictions Per Provider Order: No     Mobility Bed Mobility Overal bed mobility: Modified Independent                  Transfers Overall transfer level: Modified independent Equipment used: Rolling walker (2 wheels)                      Balance                                           ADL either performed or assessed with clinical judgement   ADL Overall ADL's : Modified independent                                       General ADL Comments: dressed for home with figure 4, educated on car transfer as wife was asking. pt educated on ambulation x4 during the day and taking time to  stretch out completing during the day at least once   Back handout provided and reviewed adls in detail. Pt educated on: never sleep on stomach, set an alarm at night for medication, avoid sitting for long periods of time, correct bed positioning for sleeping, correct sequence for bed mobility, avoiding lifting more than 5 pounds and never wash directly over incision. All education is complete and patient indicates understanding.   Vision Baseline Vision/History: 0 No visual deficits Vision Assessment?: No apparent visual deficits     Perception         Praxis         Pertinent Vitals/Pain Pain Assessment Pain Assessment: Faces Faces Pain Scale: Hurts little more Pain Location: back, RLE radicular pain Pain Descriptors / Indicators: Grimacing, Operative site guarding Pain Intervention(s): Monitored during session, Premedicated before session, Repositioned     Extremity/Trunk Assessment Upper Extremity Assessment Upper Extremity Assessment: Overall WFL for tasks assessed   Lower Extremity Assessment Lower Extremity Assessment: Overall WFL for tasks assessed;LLE deficits/detail   Cervical / Trunk Assessment Cervical / Trunk Assessment: Back Surgery   Communication Communication Communication: No apparent difficulties  Cognition Arousal: Alert Behavior During Therapy: WFL for tasks assessed/performed Cognition: No apparent impairments             OT - Cognition Comments: pt does express feeling medicated                 Following commands: Intact       Cueing  General Comments      incision dry and intact   Exercises     Shoulder Instructions      Home Living Family/patient expects to be discharged to:: Private residence Living Arrangements: Spouse/significant other Available Help at Discharge: Family;Available 24 hours/day Type of Home: House Home Access: Stairs to enter [employer] of Steps: 3 Entrance Stairs-Rails: None Home  Layout: One level     Bathroom Shower/Tub: Producer, television/film/video: Standard     Home Equipment: Firefighter;Shower seat - built in;Hand held shower head   Additional Comments: wife can give 24/7. pending 50th anniversary trip to [place]  as a motivator      Prior Functioning/Environment Prior Level of Function : Independent/Modified Independent                    OT Problem List:     OT Treatment/Interventions:        OT Goals(Current goals can be found in the care plan section)   Acute Rehab OT Goals Patient Stated Goal: to go on trip in 2 weeks if possible and big trip in august Potential to Achieve Goals: Good   OT Frequency:       Co-evaluation              AM-PAC OT 6 Clicks Daily Activity     Outcome Measure Help from another person eating meals?: None Help from another person taking care of personal grooming?: None Help from another person toileting, which includes using toliet, bedpan, or urinal?: None Help from another person bathing (including washing, rinsing, drying)?: None Help from another person to put on and taking off regular upper body clothing?: None Help from another person to put on and taking off regular lower body clothing?: None 6 Click Score: 24   End of Session Equipment Utilized During Treatment: Rolling walker (2 wheels) Nurse Communication: Mobility status  Activity Tolerance: Patient tolerated treatment well Patient left: in bed;with family/visitor present  OT Visit Diagnosis: Unsteadiness on feet (R26.81)                Time: 0930-1000 OT Time Calculation (min): 30 min Charges:  OT General Charges $OT Visit: 1 Visit OT Evaluation $OT Eval Moderate Complexity: 1 Mod OT Treatments $Self Care/Home Management : 8-22 mins   Jerry Ruiz, OTR/L  Acute Rehabilitation Services Office: [phone] .   Ely Molt 03/19/2024, 1:49 PM

## 2024-03-19 NOTE — Progress Notes (Signed)

## 2024-03-19 NOTE — Plan of Care (Signed)

## 2024-03-19 NOTE — Progress Notes (Signed)
 Subjective: 1 Day Post-Op Procedure(s) (LRB): DECOMPRESSIVE LUMBAR LAMINECTOMY LEVEL LUMBAR FOUR-FIVE, EXCISION OF SYNOVIAL CYST MICRODISCETOMY (N/A) Patient reports pain as moderate.  Reports more sore today, incisional pain and buttock/hip. No other c/o.  Objective: Vital signs in last 24 hours: Temp:  [98 F (36.7 C)-98.9 F (37.2 C)] 98.7 F (37.1 C) (07/11 0740) Pulse Rate:  [88-111] 93 (07/11 0740) Resp:  [12-20] 20 (07/11 0740) BP: (138-161)/(76-98) 160/94 (07/11 0740) SpO2:  [93 %-99 %] 99 % (07/11 0740) Weight:  [90.7 kg] 90.7 kg (07/10 0806)  Intake/Output from previous day: 07/10 0701 - 07/11 0700 In: 2484.3 [P.O.:480; I.V.:1500; IV Piggyback:504.3] Out: 560 [Urine:335; Blood:225] Intake/Output this shift: No intake/output data recorded.  No results for input(s): HGB in the last 72 hours. No results for input(s): WBC, RBC, HCT, PLT in the last 72 hours. No results for input(s): NA, K, CL, CO2, BUN, CREATININE, GLUCOSE, CALCIUM in the last 72 hours. No results for input(s): LABPT, INR in the last 72 hours.  Neurologically intact ABD soft Neurovascular intact Sensation intact distally Intact pulses distally Dorsiflexion/Plantar flexion intact Incision: dressing C/D/I and no drainage No cellulitis present Compartment soft No calf pain or sign of DVT   Assessment/Plan: 1 Day Post-Op Procedure(s) (LRB): DECOMPRESSIVE LUMBAR LAMINECTOMY LEVEL LUMBAR FOUR-FIVE, EXCISION OF SYNOVIAL CYST MICRODISCETOMY (N/A) Advance diet Up with therapy D/C IV fluids Discussed D/C instructions, dressing instructions,Lspine precautions D/C to home after therapy today   Makaylynn Bonillas M Eyleen Rawlinson 03/19/2024, 7:58 AM

## 2024-03-22 ENCOUNTER — Ambulatory Visit

## 2024-03-22 ENCOUNTER — Ambulatory Visit: Payer: Self-pay | Admitting: Cardiology

## 2024-03-22 DIAGNOSIS — I4719 Other supraventricular tachycardia: Secondary | ICD-10-CM | POA: Diagnosis not present

## 2024-03-22 LAB — CUP PACEART REMOTE DEVICE CHECK
Date Time Interrogation Session: 20250713231348
Implantable Pulse Generator Implant Date: 20210322

## 2024-04-13 DIAGNOSIS — Z79890 Hormone replacement therapy (postmenopausal): Secondary | ICD-10-CM | POA: Diagnosis not present

## 2024-04-13 DIAGNOSIS — E291 Testicular hypofunction: Secondary | ICD-10-CM | POA: Diagnosis not present

## 2024-04-14 NOTE — Progress Notes (Signed)
 Carelink Summary Report / Loop Recorder

## 2024-04-15 DIAGNOSIS — R6882 Decreased libido: Secondary | ICD-10-CM | POA: Diagnosis not present

## 2024-04-15 DIAGNOSIS — Z79890 Hormone replacement therapy (postmenopausal): Secondary | ICD-10-CM | POA: Diagnosis not present

## 2024-04-15 DIAGNOSIS — Z6830 Body mass index (BMI) 30.0-30.9, adult: Secondary | ICD-10-CM | POA: Diagnosis not present

## 2024-04-15 DIAGNOSIS — G479 Sleep disorder, unspecified: Secondary | ICD-10-CM | POA: Diagnosis not present

## 2024-04-15 DIAGNOSIS — N529 Male erectile dysfunction, unspecified: Secondary | ICD-10-CM | POA: Diagnosis not present

## 2024-04-15 DIAGNOSIS — E291 Testicular hypofunction: Secondary | ICD-10-CM | POA: Diagnosis not present

## 2024-04-15 DIAGNOSIS — M2550 Pain in unspecified joint: Secondary | ICD-10-CM | POA: Diagnosis not present

## 2024-04-15 DIAGNOSIS — R454 Irritability and anger: Secondary | ICD-10-CM | POA: Diagnosis not present

## 2024-04-15 DIAGNOSIS — R39198 Other difficulties with micturition: Secondary | ICD-10-CM | POA: Diagnosis not present

## 2024-04-22 ENCOUNTER — Ambulatory Visit (INDEPENDENT_AMBULATORY_CARE_PROVIDER_SITE_OTHER)

## 2024-04-22 DIAGNOSIS — I4719 Other supraventricular tachycardia: Secondary | ICD-10-CM

## 2024-04-22 LAB — CUP PACEART REMOTE DEVICE CHECK
Date Time Interrogation Session: 20250813230449
Implantable Pulse Generator Implant Date: 20210322

## 2024-04-23 ENCOUNTER — Ambulatory Visit: Payer: Self-pay | Admitting: Cardiology

## 2024-05-10 ENCOUNTER — Telehealth: Admitting: Physician Assistant

## 2024-05-10 DIAGNOSIS — B9689 Other specified bacterial agents as the cause of diseases classified elsewhere: Secondary | ICD-10-CM | POA: Diagnosis not present

## 2024-05-10 DIAGNOSIS — J019 Acute sinusitis, unspecified: Secondary | ICD-10-CM | POA: Diagnosis not present

## 2024-05-10 MED ORDER — AMOXICILLIN-POT CLAVULANATE 875-125 MG PO TABS: 1.0000 | ORAL_TABLET | Freq: Two times a day (BID) | ORAL | 0 refills | Status: DC

## 2024-05-10 NOTE — Progress Notes (Signed)

## 2024-05-12 DIAGNOSIS — M5416 Radiculopathy, lumbar region: Secondary | ICD-10-CM | POA: Diagnosis not present

## 2024-05-13 ENCOUNTER — Encounter: Payer: Self-pay | Admitting: Cardiology

## 2024-05-14 DIAGNOSIS — M5416 Radiculopathy, lumbar region: Secondary | ICD-10-CM | POA: Diagnosis not present

## 2024-05-18 DIAGNOSIS — M5416 Radiculopathy, lumbar region: Secondary | ICD-10-CM | POA: Diagnosis not present

## 2024-05-19 ENCOUNTER — Encounter: Payer: Self-pay | Admitting: Internal Medicine

## 2024-05-20 DIAGNOSIS — M5416 Radiculopathy, lumbar region: Secondary | ICD-10-CM | POA: Diagnosis not present

## 2024-05-24 ENCOUNTER — Ambulatory Visit (INDEPENDENT_AMBULATORY_CARE_PROVIDER_SITE_OTHER)

## 2024-05-24 DIAGNOSIS — I4719 Other supraventricular tachycardia: Secondary | ICD-10-CM | POA: Diagnosis not present

## 2024-05-25 DIAGNOSIS — M5416 Radiculopathy, lumbar region: Secondary | ICD-10-CM | POA: Diagnosis not present

## 2024-05-25 LAB — CUP PACEART REMOTE DEVICE CHECK
Date Time Interrogation Session: 20250914230551
Implantable Pulse Generator Implant Date: 20210322

## 2024-05-26 DIAGNOSIS — L578 Other skin changes due to chronic exposure to nonionizing radiation: Secondary | ICD-10-CM | POA: Diagnosis not present

## 2024-05-26 DIAGNOSIS — Z872 Personal history of diseases of the skin and subcutaneous tissue: Secondary | ICD-10-CM | POA: Diagnosis not present

## 2024-05-26 DIAGNOSIS — Z85828 Personal history of other malignant neoplasm of skin: Secondary | ICD-10-CM | POA: Diagnosis not present

## 2024-05-26 DIAGNOSIS — L281 Prurigo nodularis: Secondary | ICD-10-CM | POA: Diagnosis not present

## 2024-05-26 DIAGNOSIS — Z86018 Personal history of other benign neoplasm: Secondary | ICD-10-CM | POA: Diagnosis not present

## 2024-05-26 DIAGNOSIS — Z85820 Personal history of malignant melanoma of skin: Secondary | ICD-10-CM | POA: Diagnosis not present

## 2024-05-27 ENCOUNTER — Ambulatory Visit: Payer: Self-pay | Admitting: Cardiology

## 2024-05-27 DIAGNOSIS — M5416 Radiculopathy, lumbar region: Secondary | ICD-10-CM | POA: Diagnosis not present

## 2024-05-28 ENCOUNTER — Telehealth: Payer: Self-pay | Admitting: Internal Medicine

## 2024-05-28 ENCOUNTER — Telehealth: Payer: Self-pay

## 2024-05-28 DIAGNOSIS — R251 Tremor, unspecified: Secondary | ICD-10-CM | POA: Diagnosis not present

## 2024-05-28 DIAGNOSIS — E669 Obesity, unspecified: Secondary | ICD-10-CM | POA: Diagnosis not present

## 2024-05-28 DIAGNOSIS — G4733 Obstructive sleep apnea (adult) (pediatric): Secondary | ICD-10-CM | POA: Diagnosis not present

## 2024-05-28 DIAGNOSIS — R931 Abnormal findings on diagnostic imaging of heart and coronary circulation: Secondary | ICD-10-CM | POA: Diagnosis not present

## 2024-05-28 DIAGNOSIS — Z1331 Encounter for screening for depression: Secondary | ICD-10-CM | POA: Diagnosis not present

## 2024-05-28 DIAGNOSIS — Z0000 Encounter for general adult medical examination without abnormal findings: Secondary | ICD-10-CM | POA: Diagnosis not present

## 2024-05-28 DIAGNOSIS — I493 Ventricular premature depolarization: Secondary | ICD-10-CM | POA: Diagnosis not present

## 2024-05-28 DIAGNOSIS — R350 Frequency of micturition: Secondary | ICD-10-CM | POA: Diagnosis not present

## 2024-05-28 DIAGNOSIS — Z79899 Other long term (current) drug therapy: Secondary | ICD-10-CM | POA: Diagnosis not present

## 2024-05-28 DIAGNOSIS — R001 Bradycardia, unspecified: Secondary | ICD-10-CM | POA: Diagnosis not present

## 2024-05-28 DIAGNOSIS — E7800 Pure hypercholesterolemia, unspecified: Secondary | ICD-10-CM | POA: Diagnosis not present

## 2024-05-28 DIAGNOSIS — R7303 Prediabetes: Secondary | ICD-10-CM | POA: Diagnosis not present

## 2024-05-28 DIAGNOSIS — I10 Essential (primary) hypertension: Secondary | ICD-10-CM | POA: Diagnosis not present

## 2024-05-28 DIAGNOSIS — E291 Testicular hypofunction: Secondary | ICD-10-CM | POA: Diagnosis not present

## 2024-05-28 LAB — LAB REPORT - SCANNED
A1c: 6
Albumin, Urine POC: 0.76
Creatinine, POC: 135 mg/dL
EGFR: 91
Microalb Creat Ratio: 5.6

## 2024-05-28 NOTE — Telephone Encounter (Signed)
 Patient walked in reporting heart rates in the 40's since yesterday afternoon.  Becomes very short of breath at times with exertion and very weak.  Denies syncopal or near syncopal events, no dizziness or lightheadedness.   He is on Metoprolol .   ILR recording reviewed from midnight this morning:  Presenting shows 2:1 CHB rhythm. Graph's also show slow heart rates that correlate.  Discussed with patient that if he is symptomatic he should go to the ER now for evaluation.  He was not wanting to do that at this point but verbalizes if symptoms progress, he will go to ER over weekend.  He was instructed to hold his Metoprolol  due to bradycardia in the interim.   Appointment made with Daphne Barrack, NP on Monday 05/31/24 at 1:30pm to evaluate.

## 2024-05-28 NOTE — Telephone Encounter (Signed)
 New message  Patient was a previous Dr Dann patient.  He saw Dr Mona one time since Dr Dann left.  He want to switch from Dr Mona to Dr Jeffrie for his general cardiologist.  Is this ok?

## 2024-05-29 NOTE — H&P (View-Only) (Signed)
 Electrophysiology Office Note:   Date:  05/31/2024  ID:  Jerry Ruiz, DOB June 03, 1951, MRN [id number]  Primary Cardiologist: Vinie JAYSON Maxcy, MD Primary Heart Failure: None Electrophysiologist: OLE ONEIDA HOLTS, MD      History of Present Illness:   Jerry Ruiz is a 73 y.o. male with h/o PVC's, SVT, palpitations, WCT, HTN, HLD, MV disease, OSA on CPAP, pericardial cyst (by CT 2013, not seen on [facility]), obesity seen today for acute visit due to ILR alert for 2:1 AVB.    The patient walked into the clinic on 05/28/24 reporting HR in the 40's since 9/18 afternoon.  He also reported shortness of breath with exertion and weakness. He was on metoprolol  and was instructed to hold it.  The patient was given ER precautions but he did not want to go at that time.  ILR recording showed 2:1 CHB.    Patient reports he has felt poorly intermittently for a while > he notes this fatigue and shortness of breath has increased in the recent months. He has been unable to go to the gym to exercise which he regularly does with his wife.  He noted his HR to be slow and recently tried really hard to get his HR up to 120 and 60 was as far as he could go and he felt terrible after and got really shaky.  He tried a second time and had the same response.  He reports no energy and and is easily fatigued. He recently had back surgery (~ 6 weeks ago) and has been doing PT but doesn't feel any improvement - largely due to fatigue though.  Denies syncopal episodes.   He denies chest pain, palpitations, dyspnea, PND, orthopnea, nausea, vomiting, dizziness, syncope, edema, weight gain, or early satiety.   Review of systems complete and found to be negative unless listed in HPI.   EP Information / Studies Reviewed:    EKG is ordered today. Personal review as below.  EKG Interpretation Date/Time:  Monday May 31 2024 13:41:12 EDT Ventricular Rate:  46 PR Interval:  152 QRS Duration:  128 QT Interval:  480 QTC  Calculation: 420 R Axis:   -54  Text Interpretation: Sinus rhythm with second degree AV block (2:1) Left axis deviation Left ventricular hypertrophy with QRS widening ( Confirmed by Aniceto Jarvis ([id number]) on 05/31/2024 1:48:33 PM    Arrhythmia / AAD / Pertinent EP Studies SVT / AT  PVC's   Amiodarone  > stopped 10/2020 due to hand tremor   Device  MDT ILR > implanted 11/29/19 for palpitations        Physical Exam:   VS:  BP (!) 166/70   Pulse (!) 46   Ht 6' (1.829 m)   Wt 217 lb 9.6 oz (98.7 kg)   SpO2 95%   BMI 29.51 kg/m    Wt Readings from Last 3 Encounters:  05/31/24 217 lb 9.6 oz (98.7 kg)  03/18/24 200 lb (90.7 kg)  03/15/24 202 lb 9.6 oz (91.9 kg)     GEN: Well nourished, well developed in no acute distress NECK: No JVD; No carotid bruits CARDIAC: Regular rate and rhythm, no murmurs, rubs, gallops RESPIRATORY:  Clear to auscultation without rales, wheezing or rhonchi  ABDOMEN: Soft, non-tender, non-distended EXTREMITIES:  No edema; No deformity     ASSESSMENT AND PLAN:    Symptomatic Bradycardia  2nd Degree AVB, 2:1   -ILR review shows no AF or tachy arrhythmia   -hold AV nodal blockers, last dose  of metoprolol  was on 05/28/24  -EKG with 2:1 AVB and patient feels poorly  -discussed in detail the procedure of PPM placement, risks and benefits with patient and wife.  He indicates he would like to move forward with device placement. He is not on blood thinners and would like to have his ILR removed the day of implant. Discussed that Dr. Inocencio will meet him tomorrow before the procedure.    PVC's  -3.3 % burden on device (from 9/19-9/22/25), previously 1.5%  AT / SVT  -no evidence of fast arrhythmias   Hypertension  -elevated in clinic but may be related to 2:1 -follow up post device placement   OSA  -CPAP compliant   Follow up with Dr. Inocencio for device placement on 06/01/24 as planned  . Was previously followed by Dr. Kelsie > transitioned to Dr.  Cindie but has not met him.   Signed, Daphne Barrack, NP-C, AGACNP-BC [facility] - Electrophysiology  05/31/2024, 5:44 PM

## 2024-05-29 NOTE — Progress Notes (Unsigned)
 Electrophysiology Office Note:   Date:  05/31/2024  ID:  JOMARION MISH, DOB June 03, 1951, MRN [id number]  Primary Cardiologist: Vinie JAYSON Maxcy, MD Primary Heart Failure: None Electrophysiologist: OLE ONEIDA HOLTS, MD      History of Present Illness:   Jerry Ruiz is a 73 y.o. male with h/o PVC's, SVT, palpitations, WCT, HTN, HLD, MV disease, OSA on CPAP, pericardial cyst (by CT 2013, not seen on [facility]), obesity seen today for acute visit due to ILR alert for 2:1 AVB.    The patient walked into the clinic on 05/28/24 reporting HR in the 40's since 9/18 afternoon.  He also reported shortness of breath with exertion and weakness. He was on metoprolol  and was instructed to hold it.  The patient was given ER precautions but he did not want to go at that time.  ILR recording showed 2:1 CHB.    Patient reports he has felt poorly intermittently for a while > he notes this fatigue and shortness of breath has increased in the recent months. He has been unable to go to the gym to exercise which he regularly does with his wife.  He noted his HR to be slow and recently tried really hard to get his HR up to 120 and 60 was as far as he could go and he felt terrible after and got really shaky.  He tried a second time and had the same response.  He reports no energy and and is easily fatigued. He recently had back surgery (~ 6 weeks ago) and has been doing PT but doesn't feel any improvement - largely due to fatigue though.  Denies syncopal episodes.   He denies chest pain, palpitations, dyspnea, PND, orthopnea, nausea, vomiting, dizziness, syncope, edema, weight gain, or early satiety.   Review of systems complete and found to be negative unless listed in HPI.   EP Information / Studies Reviewed:    EKG is ordered today. Personal review as below.  EKG Interpretation Date/Time:  Monday May 31 2024 13:41:12 EDT Ventricular Rate:  46 PR Interval:  152 QRS Duration:  128 QT Interval:  480 QTC  Calculation: 420 R Axis:   -54  Text Interpretation: Sinus rhythm with second degree AV block (2:1) Left axis deviation Left ventricular hypertrophy with QRS widening ( Confirmed by Aniceto Jarvis ([id number]) on 05/31/2024 1:48:33 PM    Arrhythmia / AAD / Pertinent EP Studies SVT / AT  PVC's   Amiodarone  > stopped 10/2020 due to hand tremor   Device  MDT ILR > implanted 11/29/19 for palpitations        Physical Exam:   VS:  BP (!) 166/70   Pulse (!) 46   Ht 6' (1.829 m)   Wt 217 lb 9.6 oz (98.7 kg)   SpO2 95%   BMI 29.51 kg/m    Wt Readings from Last 3 Encounters:  05/31/24 217 lb 9.6 oz (98.7 kg)  03/18/24 200 lb (90.7 kg)  03/15/24 202 lb 9.6 oz (91.9 kg)     GEN: Well nourished, well developed in no acute distress NECK: No JVD; No carotid bruits CARDIAC: Regular rate and rhythm, no murmurs, rubs, gallops RESPIRATORY:  Clear to auscultation without rales, wheezing or rhonchi  ABDOMEN: Soft, non-tender, non-distended EXTREMITIES:  No edema; No deformity     ASSESSMENT AND PLAN:    Symptomatic Bradycardia  2nd Degree AVB, 2:1   -ILR review shows no AF or tachy arrhythmia   -hold AV nodal blockers, last dose  of metoprolol  was on 05/28/24  -EKG with 2:1 AVB and patient feels poorly  -discussed in detail the procedure of PPM placement, risks and benefits with patient and wife.  He indicates he would like to move forward with device placement. He is not on blood thinners and would like to have his ILR removed the day of implant. Discussed that Dr. Inocencio will meet him tomorrow before the procedure.    PVC's  -3.3 % burden on device (from 9/19-9/22/25), previously 1.5%  AT / SVT  -no evidence of fast arrhythmias   Hypertension  -elevated in clinic but may be related to 2:1 -follow up post device placement   OSA  -CPAP compliant   Follow up with Dr. Inocencio for device placement on 06/01/24 as planned  . Was previously followed by Dr. Kelsie > transitioned to Dr.  Cindie but has not met him.   Signed, Daphne Barrack, NP-C, AGACNP-BC [facility] - Electrophysiology  05/31/2024, 5:44 PM

## 2024-05-31 ENCOUNTER — Ambulatory Visit: Attending: Pulmonary Disease | Admitting: Pulmonary Disease

## 2024-05-31 ENCOUNTER — Encounter: Payer: Self-pay | Admitting: Pulmonary Disease

## 2024-05-31 VITALS — BP 166/70 | HR 46 | Ht 72.0 in | Wt 217.6 lb

## 2024-05-31 DIAGNOSIS — I10 Essential (primary) hypertension: Secondary | ICD-10-CM | POA: Diagnosis not present

## 2024-05-31 DIAGNOSIS — I471 Supraventricular tachycardia, unspecified: Secondary | ICD-10-CM

## 2024-05-31 DIAGNOSIS — R002 Palpitations: Secondary | ICD-10-CM | POA: Diagnosis not present

## 2024-05-31 DIAGNOSIS — Z95818 Presence of other cardiac implants and grafts: Secondary | ICD-10-CM | POA: Diagnosis not present

## 2024-05-31 DIAGNOSIS — R001 Bradycardia, unspecified: Secondary | ICD-10-CM

## 2024-05-31 DIAGNOSIS — I493 Ventricular premature depolarization: Secondary | ICD-10-CM | POA: Diagnosis not present

## 2024-05-31 DIAGNOSIS — Z1211 Encounter for screening for malignant neoplasm of colon: Secondary | ICD-10-CM | POA: Diagnosis not present

## 2024-05-31 NOTE — Patient Instructions (Signed)
 Medication Instructions:  Your physician recommends that you continue on your current medications as directed. Please refer to the Current Medication list given to you today.  *If you need a refill on your cardiac medications before your next appointment, please call your pharmacy*  Lab Work: BMET, CBC-TODAY If you have labs (blood work) drawn today and your tests are completely normal, you will receive your results only by: MyChart Message (if you have MyChart) OR A paper copy in the mail If you have any lab test that is abnormal or we need to change your treatment, we will call you to review the results.  Testing/Procedures: See letter  Follow-Up: At [facility], you and your health needs are our priority.  As part of our continuing mission to provide you with exceptional heart care, our providers are all part of one team.  This team includes your primary Cardiologist (physician) and Advanced Practice Providers or APPs (Physician Assistants and Nurse Practitioners) who all work together to provide you with the care you need, when you need it.  Your next appointment:   Follow up will be arranged and print out on your discharge summary after your procedure.

## 2024-05-31 NOTE — Progress Notes (Signed)
 Remote Loop Recorder Transmission

## 2024-06-01 ENCOUNTER — Ambulatory Visit (HOSPITAL_COMMUNITY)
Admission: RE | Admit: 2024-06-01 | Discharge: 2024-06-01 | Disposition: A | Discharge status: Home / Self Care | Attending: Cardiology | Admitting: Cardiology

## 2024-06-01 ENCOUNTER — Ambulatory Visit (HOSPITAL_COMMUNITY)

## 2024-06-01 ENCOUNTER — Ambulatory Visit (HOSPITAL_COMMUNITY)
Admission: RE | Disposition: A | Discharge status: Home / Self Care | Payer: Self-pay | Source: Home / Self Care | Attending: Cardiology

## 2024-06-01 ENCOUNTER — Other Ambulatory Visit: Payer: Self-pay

## 2024-06-01 DIAGNOSIS — G4733 Obstructive sleep apnea (adult) (pediatric): Secondary | ICD-10-CM | POA: Diagnosis not present

## 2024-06-01 DIAGNOSIS — I441 Atrioventricular block, second degree: Secondary | ICD-10-CM

## 2024-06-01 DIAGNOSIS — I517 Cardiomegaly: Secondary | ICD-10-CM | POA: Diagnosis not present

## 2024-06-01 DIAGNOSIS — I442 Atrioventricular block, complete: Secondary | ICD-10-CM | POA: Insufficient documentation

## 2024-06-01 DIAGNOSIS — I119 Hypertensive heart disease without heart failure: Secondary | ICD-10-CM | POA: Insufficient documentation

## 2024-06-01 DIAGNOSIS — I493 Ventricular premature depolarization: Secondary | ICD-10-CM | POA: Insufficient documentation

## 2024-06-01 DIAGNOSIS — I471 Supraventricular tachycardia, unspecified: Secondary | ICD-10-CM | POA: Insufficient documentation

## 2024-06-01 HISTORY — PX: LOOP RECORDER REMOVAL: EP1215

## 2024-06-01 HISTORY — PX: PACEMAKER IMPLANT: EP1218

## 2024-06-01 LAB — CBC
Hematocrit: 43.8 % (ref 37.5–51.0)
Hemoglobin: 14.2 g/dL (ref 13.0–17.7)
MCH: 29.1 pg (ref 26.6–33.0)
MCHC: 32.4 g/dL (ref 31.5–35.7)
MCV: 90 fL (ref 79–97)
Platelets: 237 x10E3/uL (ref 150–450)
RBC: 4.88 x10E6/uL (ref 4.14–5.80)
RDW: 15 % (ref 11.6–15.4)
WBC: 9.3 x10E3/uL (ref 3.4–10.8)

## 2024-06-01 LAB — BASIC METABOLIC PANEL WITH GFR
BUN/Creatinine Ratio: 17 (ref 10–24)
BUN: 16 mg/dL (ref 8–27)
CO2: 21 mmol/L (ref 20–29)
Calcium: 8.9 mg/dL (ref 8.6–10.2)
Chloride: 104 mmol/L (ref 96–106)
Creatinine, Ser: 0.96 mg/dL (ref 0.76–1.27)
Glucose: 81 mg/dL (ref 70–99)
Potassium: 4.2 mmol/L (ref 3.5–5.2)
Sodium: 142 mmol/L (ref 134–144)
eGFR: 84 mL/min/1.73 (ref >59)

## 2024-06-01 SURGERY — PACEMAKER IMPLANT

## 2024-06-01 MED ORDER — SODIUM CHLORIDE 0.9 % IV SOLN: 80.0000 mg | INTRAVENOUS | Status: AC

## 2024-06-01 MED ORDER — POVIDONE-IODINE 10 % EX SWAB: 2.0000 | Freq: Once | CUTANEOUS | Status: DC

## 2024-06-01 MED ORDER — FENTANYL CITRATE (PF) 100 MCG/2ML IJ SOLN
INTRAMUSCULAR | Status: AC
  Filled 2024-06-01: qty 2

## 2024-06-01 MED ORDER — MIDAZOLAM HCL 2 MG/2ML IJ SOLN
INTRAMUSCULAR | Status: AC
  Filled 2024-06-01: qty 2

## 2024-06-01 MED ORDER — SODIUM CHLORIDE 0.9 % IV SOLN: INTRAVENOUS | Status: DC

## 2024-06-01 MED ORDER — LIDOCAINE HCL (PF) 1 % IJ SOLN
INTRAMUSCULAR | Status: AC
Start: 2024-06-01 — End: 2024-06-01
  Filled 2024-06-01: qty 60

## 2024-06-01 MED ORDER — CHLORHEXIDINE GLUCONATE 4 % EX SOLN
60.0000 mL | Freq: Once | CUTANEOUS | Status: DC
  Filled 2024-06-01: qty 60

## 2024-06-01 MED ORDER — ONDANSETRON HCL 4 MG/2ML IJ SOLN: 4.0000 mg | Freq: Four times a day (QID) | INTRAMUSCULAR | Status: DC | PRN

## 2024-06-01 MED ORDER — HEPARIN (PORCINE) IN NACL 1000-0.9 UT/500ML-% IV SOLN
INTRAVENOUS | Status: DC | PRN
  Administered 2024-06-01: 500 mL

## 2024-06-01 MED ORDER — VANCOMYCIN HCL IN DEXTROSE 1-5 GM/200ML-% IV SOLN
INTRAVENOUS | Status: AC
  Administered 2024-06-01: 1000 mg via INTRAVENOUS
  Filled 2024-06-01: qty 200

## 2024-06-01 MED ORDER — ACETAMINOPHEN 325 MG PO TABS: 325.0000 mg | ORAL_TABLET | ORAL | Status: DC | PRN

## 2024-06-01 MED ORDER — VANCOMYCIN HCL IN DEXTROSE 1-5 GM/200ML-% IV SOLN: 1000.0000 mg | INTRAVENOUS | Status: AC

## 2024-06-01 MED ORDER — FENTANYL CITRATE (PF) 100 MCG/2ML IJ SOLN
INTRAMUSCULAR | Status: DC | PRN
  Administered 2024-06-01 (×2): 25 ug via INTRAVENOUS
  Administered 2024-06-01: 50 ug via INTRAVENOUS

## 2024-06-01 MED ORDER — LIDOCAINE HCL (PF) 1 % IJ SOLN
INTRAMUSCULAR | Status: DC | PRN
  Administered 2024-06-01: 60 mL
  Administered 2024-06-01: 30 mL
  Administered 2024-06-01: 10 mL

## 2024-06-01 MED ORDER — LIDOCAINE HCL (PF) 1 % IJ SOLN
INTRAMUSCULAR | Status: AC
Start: 2024-06-01 — End: 2024-06-01
  Filled 2024-06-01: qty 30

## 2024-06-01 MED ORDER — SODIUM CHLORIDE 0.9 % IV SOLN
INTRAVENOUS | Status: AC
  Administered 2024-06-01: 80 mg
  Filled 2024-06-01: qty 2

## 2024-06-01 MED ORDER — MIDAZOLAM HCL 5 MG/5ML IJ SOLN
INTRAMUSCULAR | Status: DC | PRN
  Administered 2024-06-01 (×4): 1 mg via INTRAVENOUS

## 2024-06-01 MED ORDER — SODIUM CHLORIDE 0.9 % IV SOLN
INTRAVENOUS | Status: DC
Start: 2024-06-01 — End: 2024-06-01

## 2024-06-01 SURGICAL SUPPLY — 12 items
CABLE SURGICAL S-101-97-12 (CABLE) ×2 IMPLANT
CATH RIGHTSITE C315HIS02 (CATHETERS) IMPLANT
IPG PACE AZUR XT DR MRI W1DR01 (Pacemaker) IMPLANT
LEAD CAPSURE NOVUS 5076-52CM (Lead) IMPLANT
LEAD SELECT SECURE 3830 383069 (Lead) IMPLANT
PACK LOOP INSERTION (CUSTOM PROCEDURE TRAY) ×2 IMPLANT
PAD DEFIB RADIO PHYSIO CONN (PAD) ×2 IMPLANT
SHEATH 7FR PRELUDE SNAP 13 (SHEATH) IMPLANT
SHEATH PROBE COVER 6X72 (BAG) IMPLANT
SLITTER 6232ADJ (MISCELLANEOUS) IMPLANT
TRAY PACEMAKER INSERTION (PACKS) ×2 IMPLANT
WIRE HI TORQ VERSACORE-J 145CM (WIRE) IMPLANT

## 2024-06-01 NOTE — Discharge Instructions (Addendum)
 After Your Pacemaker   You have a Medtronic Pacemaker  If you have a Medtronic or Biotronik device, plug in your home monitor once you get home, and no manual interaction is required.   If you have an [employer] or [employer] device, plug your home monitor once you get home, sit near the device, and press the large activation button. Sit nearby until the process is complete, usually notated by lights on the monitor.   If you were set up for monitoring using an app on your phone, make sure the app remains open in the background and the Bluetooth remains on.  ACTIVITY Do not lift your arm above shoulder height for 1 week after your procedure. After 7 days, you may progress as below.  You should remove your sling 24 hours after your procedure, unless otherwise instructed by your provider.     Tuesday June 08, 2024  Wednesday June 09, 2024 Thursday June 10, 2024 Friday June 11, 2024   Do not lift, push, pull, or carry anything over 10 pounds with the affected arm until 6 weeks (Tuesday July 13, 2024 ) after your procedure.   You may drive AFTER your wound check, unless you have been told otherwise by your provider.   Ask your healthcare provider when you can go back to work   INCISION/Dressing  If large square, outer bandage is left in place, this can be removed after 24 hours from your procedure. Do not remove steri-strips or glue as below.   If a PRESSURE DRESSING (a bulky dressing that usually goes up over your shoulder) was applied or left in place, please follow instructions given by your provider on when to return to have this removed.   Monitor your Pacemaker site for redness, swelling, and drainage. Call the device clinic at [phone] if you experience these symptoms or fever/chills.  If your incision is sealed with Steri-strips or staples, you may shower 7 days after your procedure or when told by your provider. Do not remove the steri-strips or let the  shower hit directly on your site. You may wash around your site with soap and water.    If you were discharged in a sling, please do not wear this during the day more than 48 hours after your surgery unless otherwise instructed. This may increase the risk of stiffness and soreness in your shoulder.   Avoid lotions, ointments, or perfumes over your incision until it is well-healed.  You may use a hot tub or a pool AFTER your wound check appointment if the incision is completely closed.  Pacemaker Alerts:  Some alerts are vibratory and others beep. These are NOT emergencies. Please call our office to let us  know. If this occurs at night or on weekends, it can wait until the next business day. Send a remote transmission.  If your device is capable of reading fluid status (for heart failure), you will be offered monthly monitoring to review this with you.   DEVICE MANAGEMENT Remote monitoring is used to monitor your pacemaker from home. This monitoring is scheduled every 91 days by our office. It allows us  to keep an eye on the functioning of your device to ensure it is working properly. You will routinely see your Electrophysiologist annually (more often if necessary).  This will appear as a REMOTE check on your MyChart schedule. These are automatic and there is nothing for you to manually do unless otherwise instructed.  You should receive your ID card for your  new device in 4-8 weeks. Keep this card with you at all times once received. Consider wearing a medical alert bracelet or necklace.  Your Pacemaker may be MRI compatible. This will be discussed at your next office visit/wound check.  You should avoid contact with strong electric or magnetic fields.   Do not use amateur (ham) radio equipment or electric (arc) welding torches. MP3 player headphones with magnets should not be used. Some devices are safe to use if held at least 12 inches (30 cm) from your Pacemaker. These include power tools,  lawn mowers, and speakers. If you are unsure if something is safe to use, ask your health care provider.  When using your cell phone, hold it to the ear that is on the opposite side from the Pacemaker. Do not leave your cell phone in a pocket over the Pacemaker.  You may safely use electric blankets, heating pads, computers, and microwave ovens.  Call the office right away if: You have chest pain. You feel more short of breath than you have felt before. You feel more light-headed than you have felt before. Your incision starts to open up.  This information is not intended to replace advice given to you by your health care provider. Make sure you discuss any questions you have with your health care provider.

## 2024-06-01 NOTE — Interval H&P Note (Signed)
 History and Physical Interval Note:  06/01/2024 2:07 PM  Jerry Ruiz  has presented today for surgery, with the diagnosis of bradycardia.  The various methods of treatment have been discussed with the patient and family. After consideration of risks, benefits and other options for treatment, the patient has consented to  Procedure(s): PACEMAKER IMPLANT (N/A) LOOP RECORDER REMOVAL (N/A) as a surgical intervention.  The patient's history has been reviewed, patient examined, no change in status, stable for surgery.  I have reviewed the patient's chart and labs.  Questions were answered to the patient's satisfaction.     Jerolene Kupfer [employer]

## 2024-06-01 NOTE — Progress Notes (Signed)
 Discharge instructions reviewed with patient and wife  at bedside, all questions and concerns addressed and answered. verbalized understanding. Able to ambulated in the hallway no complications or concerns note . Was able to void in urinal at bedside  without difficulty. PT was escorted from the unit via wheel chair to personal vehicle.

## 2024-06-02 ENCOUNTER — Encounter (HOSPITAL_COMMUNITY): Payer: Self-pay | Admitting: Cardiology

## 2024-06-02 MED FILL — Midazolam HCl Inj 2 MG/2ML (Base Equivalent): INTRAMUSCULAR | Qty: 2 | Status: AC

## 2024-06-03 ENCOUNTER — Telehealth: Payer: Self-pay | Admitting: Cardiology

## 2024-06-03 NOTE — Progress Notes (Signed)
 Remote Loop Recorder Transmission

## 2024-06-03 NOTE — Telephone Encounter (Signed)
 Spoke with pt who said one of the steri-strips from his procedure yesterday with Dr. Inocencio had come off while he was changing clothes. He said he did place another bandage on top of the wound because he didn't have a steri-strip. Explained that I would send this to Dr. Inocencio and our device clinic to review and we would call him back with recommendations. Pt verbalized understanding and had no further questions at this time.

## 2024-06-03 NOTE — Telephone Encounter (Signed)
 Returned call to pt.  Per Pt all of the steristrips that were over his loop extraction site fell off.  He states the incision had not yet closed when this happened.  He placed a bandaid yesterday.  Advised Pt to keep bandaid in place today.  Asked him to remove tomorrow morning and evaluate site.  If incision is closed with no s/s of infection nothing to do.  If he notices the incision site is still open or any s/s of infection he will call Device clinic directly and will plan to have him stop in for evaluation.  Pt in agreement with plan.

## 2024-06-03 NOTE — Telephone Encounter (Signed)
 Pt states the steri-strips from his loop recorder wound came off as he was changing the dressing and but a basic bandage on, but wants to know if there is something else he should be using to cover wound

## 2024-06-04 ENCOUNTER — Telehealth: Payer: Self-pay

## 2024-06-04 NOTE — Telephone Encounter (Signed)
 Spoke with patient.  He calls back to let us  know that the area appears to have remained closed over night.  He will keep a bandaid in place another day or 2 just to be sure.  He feels comfortable with it remaining closed at this point.    Will let us  know if any further concerns.

## 2024-06-04 NOTE — Telephone Encounter (Signed)
 Follow-up after same day discharge: Implant date: 06/01/2024 MD: Soyla Norton, MD implanted urgently - Ole Holts, MD to follow Device: MDT [phone] Azure PPM  Location: Left Chest  Explanted ILR    Wound check visit: 06/15/24 90 day MD follow-up: 09/06/24 with Chantal Needle, NP   Remote Transmission received:yes, 06/03/24  Dressing/sling removed: Yes  Confirm OAC restart on: Not on an [facility]   Please continue to monitor your cardiac device site for redness, swelling, and drainage. Call the device clinic at [phone] if you experience these symptoms, fever/chills, or have questions about your device.   Remote monitoring is used to monitor your cardiac device from home. This monitoring is scheduled every 91 days by our office. It allows us  to keep an eye on the functioning of your device to ensure it is working properly.

## 2024-06-09 DIAGNOSIS — H2513 Age-related nuclear cataract, bilateral: Secondary | ICD-10-CM | POA: Diagnosis not present

## 2024-06-09 DIAGNOSIS — H5203 Hypermetropia, bilateral: Secondary | ICD-10-CM | POA: Diagnosis not present

## 2024-06-09 DIAGNOSIS — H1045 Other chronic allergic conjunctivitis: Secondary | ICD-10-CM | POA: Diagnosis not present

## 2024-06-09 DIAGNOSIS — H35033 Hypertensive retinopathy, bilateral: Secondary | ICD-10-CM | POA: Diagnosis not present

## 2024-06-15 ENCOUNTER — Ambulatory Visit: Attending: Cardiovascular Disease

## 2024-06-15 DIAGNOSIS — I441 Atrioventricular block, second degree: Secondary | ICD-10-CM

## 2024-06-15 LAB — CUP PACEART INCLINIC DEVICE CHECK
Date Time Interrogation Session: 20251007125930
Implantable Lead Connection Status: 753985
Implantable Lead Connection Status: 753985
Implantable Lead Implant Date: 20250923
Implantable Lead Implant Date: 20250923
Implantable Lead Location: 753859
Implantable Lead Location: 753860
Implantable Lead Model: 3830
Implantable Lead Model: 5076
Implantable Pulse Generator Implant Date: 20250923

## 2024-06-15 NOTE — Progress Notes (Signed)
 Normal dual chamber pacemaker wound check. Presenting rhythm: AS/VP 98 . Wound well healed. Routine testing performed. Thresholds, sensing, and impedance consistent with implant measurements and at 3.5V safety margin/auto capture until 3 month visit. No episodes. Reviewed arm restrictions to continue for 6 weeks total post op.  Pt enrolled in remote follow-up.

## 2024-06-15 NOTE — Patient Instructions (Signed)

## 2024-06-16 NOTE — Progress Notes (Signed)
 Remote Loop Recorder Transmission

## 2024-06-18 ENCOUNTER — Ambulatory Visit: Payer: Self-pay | Admitting: Cardiology

## 2024-06-25 ENCOUNTER — Telehealth: Payer: Self-pay

## 2024-06-25 NOTE — Telephone Encounter (Signed)
 Patient returned call to clinic as requested. After speaking w/ Dr. Inocencio shoulder surgery should be fine as long as it is not near the device site. Informed patient the surgical team will also send us  a device clearance as well. Patient verbalized understanding.   Informed to contact device clinic w/ any further questions or concerns. Patient appreciative for call.

## 2024-06-25 NOTE — Telephone Encounter (Signed)
 Attempted to return call to numbers on file to discuss concerns regarding shoulder surgery. Left voicemail on both home and cell phone listed.   Patient previously had MDT dual PPM implanted 06/01/2024 by Dr. Inocencio and is inquiring about potential shoulder surgery in the future.   Device clinic number given to return call and for any other questions and concerns.

## 2024-07-14 DIAGNOSIS — E291 Testicular hypofunction: Secondary | ICD-10-CM | POA: Diagnosis not present

## 2024-07-14 DIAGNOSIS — Z79890 Hormone replacement therapy (postmenopausal): Secondary | ICD-10-CM | POA: Diagnosis not present

## 2024-07-16 ENCOUNTER — Ambulatory Visit

## 2024-07-16 DIAGNOSIS — I493 Ventricular premature depolarization: Secondary | ICD-10-CM | POA: Diagnosis not present

## 2024-07-16 DIAGNOSIS — I4719 Other supraventricular tachycardia: Secondary | ICD-10-CM | POA: Diagnosis not present

## 2024-07-16 DIAGNOSIS — I10 Essential (primary) hypertension: Secondary | ICD-10-CM | POA: Diagnosis not present

## 2024-07-16 DIAGNOSIS — R454 Irritability and anger: Secondary | ICD-10-CM | POA: Diagnosis not present

## 2024-07-16 DIAGNOSIS — N529 Male erectile dysfunction, unspecified: Secondary | ICD-10-CM | POA: Diagnosis not present

## 2024-07-16 DIAGNOSIS — Z6830 Body mass index (BMI) 30.0-30.9, adult: Secondary | ICD-10-CM | POA: Diagnosis not present

## 2024-07-16 DIAGNOSIS — F32A Depression, unspecified: Secondary | ICD-10-CM | POA: Diagnosis not present

## 2024-07-16 DIAGNOSIS — M2550 Pain in unspecified joint: Secondary | ICD-10-CM | POA: Diagnosis not present

## 2024-07-16 DIAGNOSIS — R6882 Decreased libido: Secondary | ICD-10-CM | POA: Diagnosis not present

## 2024-07-16 DIAGNOSIS — E291 Testicular hypofunction: Secondary | ICD-10-CM | POA: Diagnosis not present

## 2024-07-18 LAB — CUP PACEART REMOTE DEVICE CHECK
Battery Remaining Longevity: 156 mo
Battery Voltage: 3.21 V
Brady Statistic AP VP Percent: 7.1 %
Brady Statistic AP VS Percent: 0 %
Brady Statistic AS VP Percent: 92.68 %
Brady Statistic AS VS Percent: 0.22 %
Brady Statistic RA Percent Paced: 7.14 %
Brady Statistic RV Percent Paced: 99.78 %
Date Time Interrogation Session: 20251107120005
Implantable Lead Connection Status: 753985
Implantable Lead Connection Status: 753985
Implantable Lead Implant Date: 20250923
Implantable Lead Implant Date: 20250923
Implantable Lead Location: 753859
Implantable Lead Location: 753860
Implantable Lead Model: 3830
Implantable Lead Model: 5076
Implantable Pulse Generator Implant Date: 20250923
Lead Channel Impedance Value: 304 Ohm
Lead Channel Impedance Value: 342 Ohm
Lead Channel Impedance Value: 532 Ohm
Lead Channel Impedance Value: 665 Ohm
Lead Channel Pacing Threshold Amplitude: 0.75 V
Lead Channel Pacing Threshold Amplitude: 0.875 V
Lead Channel Pacing Threshold Pulse Width: 0.4 ms
Lead Channel Pacing Threshold Pulse Width: 0.4 ms
Lead Channel Sensing Intrinsic Amplitude: 1.375 mV
Lead Channel Sensing Intrinsic Amplitude: 1.375 mV
Lead Channel Sensing Intrinsic Amplitude: 13.125 mV
Lead Channel Setting Pacing Amplitude: 1.5 V
Lead Channel Setting Pacing Amplitude: 2 V
Lead Channel Setting Pacing Pulse Width: 0.4 ms
Lead Channel Setting Sensing Sensitivity: 1.2 mV
Zone Setting Status: 755011

## 2024-07-20 ENCOUNTER — Ambulatory Visit: Payer: Self-pay | Admitting: Cardiology

## 2024-07-20 NOTE — Progress Notes (Signed)
 Remote PPM Transmission

## 2024-08-03 DIAGNOSIS — N401 Enlarged prostate with lower urinary tract symptoms: Secondary | ICD-10-CM | POA: Diagnosis not present

## 2024-08-03 DIAGNOSIS — R351 Nocturia: Secondary | ICD-10-CM | POA: Diagnosis not present

## 2024-08-03 DIAGNOSIS — N5201 Erectile dysfunction due to arterial insufficiency: Secondary | ICD-10-CM | POA: Diagnosis not present

## 2024-08-10 DIAGNOSIS — M19012 Primary osteoarthritis, left shoulder: Secondary | ICD-10-CM | POA: Diagnosis not present

## 2024-08-11 ENCOUNTER — Telehealth (HOSPITAL_BASED_OUTPATIENT_CLINIC_OR_DEPARTMENT_OTHER): Payer: Self-pay

## 2024-08-11 NOTE — Telephone Encounter (Signed)
 Dr. Inocencio and Daphne, Pt is pending LEFT shoulder surgery and had PPM implanted 06/01/24. What are your thoughts moving forward with surgery this close to PPM implant?

## 2024-08-11 NOTE — Telephone Encounter (Signed)
   Pre-operative Risk Assessment    Patient Name: Jerry Ruiz  DOB: 1951/02/13 MRN: [id number]   Date of last office visit: 05/31/24 with [facility]  Date of next office visit: 09/06/24 with [facility]  Request for Surgical Clearance    Procedure:  Left reverse total shoulder arthroplasty   Date of Surgery:  Clearance TBD                                 Surgeon:  Dr. kay Socks Group or Practice Name:  [facility] Phone number:  [phone] Fax number:  [phone]   Type of Clearance Requested:   - Medical    Type of Anesthesia:  Choice   Additional requests/questions:    [name]   08/11/2024, 12:34 PM

## 2024-08-12 NOTE — Telephone Encounter (Signed)
   Name: Jerry Ruiz  DOB: 08/19/1951  MRN: [id number]  Primary Cardiologist: Vinie JAYSON Maxcy, MD  Chart reviewed as part of pre-operative protocol coverage. The patient has an upcoming visit scheduled with Daphne Barrack, NP on 09/06/2024 at which time clearance can be addressed in case there are any issues that would impact surgical recommendations.   Per Dr.Camnitz Would prefer this wait until after his 37-month visit with Brandi at the end of December.    Left reverse total shoulder arthroplasty Is not scheduled until TBD as below. I added preop FYI to appointment note so that provider is aware to address at time of outpatient visit.  Per office protocol the cardiology provider should forward their finalized clearance decision and recommendations regarding antiplatelet therapy to the requesting party below.    Patient is not on anticoagulation or antiplatelet per review of current medical record in Epic.    I will route this message as FYI to requesting party and remove this message from the preop box as separate preop APP input not needed at this time.   Please call with any questions.  Lamarr Satterfield, NP  08/12/2024, 8:22 AM

## 2024-08-12 NOTE — Telephone Encounter (Signed)
 Pt has appt 09/06/24 Daphne Barrack, NP.

## 2024-08-26 ENCOUNTER — Other Ambulatory Visit (HOSPITAL_COMMUNITY): Payer: Self-pay | Admitting: *Deleted

## 2024-08-26 DIAGNOSIS — E785 Hyperlipidemia, unspecified: Secondary | ICD-10-CM

## 2024-08-27 ENCOUNTER — Ambulatory Visit
Admission: RE | Admit: 2024-08-27 | Discharge: 2024-08-27 | Disposition: A | Discharge status: Home / Self Care | Payer: Self-pay | Source: Ambulatory Visit

## 2024-08-27 DIAGNOSIS — E785 Hyperlipidemia, unspecified: Secondary | ICD-10-CM | POA: Insufficient documentation

## 2024-09-03 NOTE — Progress Notes (Deleted)
 " Electrophysiology Office Note:   Date:  09/03/2024  ID:  Jerry Ruiz, DOB [birth year], MRN [id number]  Primary Cardiologist: Vinie JAYSON Maxcy, MD Primary Heart Failure: None Electrophysiologist: OLE ONEIDA HOLTS, MD   {Click to update primary MD,subspecialty MD or APP then REFRESH:1}    History of Present Illness:   Jerry Ruiz is a 73 y.o. male with h/o PVC's, SVT, palpitations, WCT, HTN, HLD, MV disease, OSA on CPAP, pericardial cyst (by CT 2013, not seen on [facility]), obesity  seen today for routine electrophysiology follow-up s/p Pacemaker implant and pre-procedure clearance.  Since last being seen in our clinic the patient reports doing ***.    He ***denies chest pain, palpitations, dyspnea, PND, orthopnea, nausea, vomiting, dizziness, syncope, edema, weight gain, or early satiety.    Review of systems complete and found to be negative unless listed in HPI.   EP Information / Studies Reviewed:    EKG is ordered today. Personal review as below.      PPM Interrogation-  reviewed in detail today,  See PACEART report.  Device History: Medtronic Dual Chamber PPM implanted 05/12/24 for Second Degree AV block  Arrhythmia / AAD / Pertinent EP Studies SVT / AT  PVC's   Amiodarone  > stopped 10/2020 due to hand tremor  Risk Assessment/Calculations:     No BP recorded.  {Refresh Note OR Click here to enter BP  :1}***        Physical Exam:   VS:  There were no vitals taken for this visit.   Wt Readings from Last 3 Encounters:  06/01/24 215 lb (97.5 kg)  05/31/24 217 lb 9.6 oz (98.7 kg)  03/18/24 200 lb (90.7 kg)     GEN: Well nourished, well developed in no acute distress NECK: No JVD; No carotid bruits CARDIAC: {EPRHYTHM:28826}, no murmurs, rubs, gallops RESPIRATORY:  Clear to auscultation without rales, wheezing or rhonchi  ABDOMEN: Soft, non-tender, non-distended EXTREMITIES:  No edema; No deformity   ASSESSMENT AND PLAN:    Symptomatic Bradycardia / Second Degree AV  block s/p Medtronic PPM  -Normal PPM function -See Pace Art report -No changes today  PVC's  -***% burden by device   AT / SVT  -no evidence on device ***   Hypertension  -well controlled on current regimen ***  OSA  -CPAP compliance encouraged ***   Pre-Procedure Risk Assessment  Procedure:  Left reverse total shoulder arthroplasty  Date of Surgery:  Clearance TBD                              Surgeon:  Dr. kay Surgeon's Group or Practice Name:  [facility] Phone number:  [phone] Fax number:  [phone] Type of Clearance Requested:   - Medical  Type of Anesthesia:  Choice    {Click Here to Calculate RCRI      :789639253}  { Click Here to Calculate DASI      :789639253} Mr. [name] perioperative risk of a major cardiac event is 0.4% according to the Revised Cardiac Risk Index (RCRI).  Therefore, he is at low risk for perioperative complications.   {Select if HIGH (RCRI >/=1%) Risk (Optional):21036030}  Recommendations: {Does the patient have valve dz (mod or greater and no Echo > 1 year), or clinical change (worsening SOB)?:21036023}  Antiplatelet and/or Anticoagulation Recommendations: {Antiplatelet Recommendations                  :21036016} {Anticoagulation Recommendations           :  78963980}   Disposition:   Follow up with Dr. Inocencio / EP APP {EPFOLLOW LE:71826}  Signed, Daphne Barrack, NP-C, AGACNP-BC [facility] - Electrophysiology  09/03/2024, 8:06 AM  "

## 2024-09-06 ENCOUNTER — Ambulatory Visit: Payer: Self-pay | Admitting: Cardiology

## 2024-09-06 ENCOUNTER — Ambulatory Visit: Attending: Pulmonary Disease | Admitting: Cardiology

## 2024-09-06 ENCOUNTER — Encounter: Admitting: Cardiology

## 2024-09-06 ENCOUNTER — Encounter: Payer: Self-pay | Admitting: Pulmonary Disease

## 2024-09-06 VITALS — BP 148/92 | HR 78 | Ht 72.0 in | Wt 219.8 lb

## 2024-09-06 DIAGNOSIS — I442 Atrioventricular block, complete: Secondary | ICD-10-CM | POA: Insufficient documentation

## 2024-09-06 DIAGNOSIS — I4891 Unspecified atrial fibrillation: Secondary | ICD-10-CM | POA: Diagnosis not present

## 2024-09-06 DIAGNOSIS — I493 Ventricular premature depolarization: Secondary | ICD-10-CM

## 2024-09-06 DIAGNOSIS — I4719 Other supraventricular tachycardia: Secondary | ICD-10-CM

## 2024-09-06 LAB — CUP PACEART INCLINIC DEVICE CHECK
Battery Remaining Longevity: 155 mo
Battery Voltage: 3.2 V
Brady Statistic AP VP Percent: 12.46 %
Brady Statistic AP VS Percent: 0 %
Brady Statistic AS VP Percent: 87.04 %
Brady Statistic AS VS Percent: 0.5 %
Brady Statistic RA Percent Paced: 12.61 %
Brady Statistic RV Percent Paced: 99.5 %
Date Time Interrogation Session: 20251229141115
Implantable Lead Connection Status: 753985
Implantable Lead Connection Status: 753985
Implantable Lead Implant Date: 20250923
Implantable Lead Implant Date: 20250923
Implantable Lead Location: 753859
Implantable Lead Location: 753860
Implantable Lead Model: 3830
Implantable Lead Model: 5076
Implantable Pulse Generator Implant Date: 20250923
Lead Channel Impedance Value: 304 Ohm
Lead Channel Impedance Value: 323 Ohm
Lead Channel Impedance Value: 570 Ohm
Lead Channel Impedance Value: 684 Ohm
Lead Channel Pacing Threshold Amplitude: 0.875 V
Lead Channel Pacing Threshold Amplitude: 1 V
Lead Channel Pacing Threshold Pulse Width: 0.4 ms
Lead Channel Pacing Threshold Pulse Width: 0.4 ms
Lead Channel Sensing Intrinsic Amplitude: 13.125 mV
Lead Channel Sensing Intrinsic Amplitude: 2.25 mV
Lead Channel Sensing Intrinsic Amplitude: 2.375 mV
Lead Channel Setting Pacing Amplitude: 1.5 V
Lead Channel Setting Pacing Amplitude: 2 V
Lead Channel Setting Pacing Pulse Width: 0.4 ms
Lead Channel Setting Sensing Sensitivity: 1.2 mV
Zone Setting Status: 755011

## 2024-09-06 NOTE — Patient Instructions (Addendum)
 Medication Instructions:  None  *If you need a refill on your cardiac medications before your next appointment, please call your pharmacy*  Lab Work: None  If you have labs (blood work) drawn today and your tests are completely normal, you will receive your results only by: MyChart Message (if you have MyChart) OR A paper copy in the mail If you have any lab test that is abnormal or we need to change your treatment, we will call you to review the results.  Testing/Procedures: None   Follow-Up: At [facility], you and your health needs are our priority.  As part of our continuing mission to provide you with exceptional heart care, our providers are all part of one team.  This team includes your primary Cardiologist (physician) and Advanced Practice Providers or APPs (Physician Assistants and Nurse Practitioners) who all work together to provide you with the care you need, when you need it.  Your next appointment:   1 year  Provider:    Daphne Barrack, NP  We recommend signing up for the patient portal called MyChart.  Sign up information is provided on this After Visit Summary.  MyChart is used to connect with patients for Virtual Visits (Telemedicine).  Patients are able to view lab/test results, encounter notes, upcoming appointments, etc.  Non-urgent messages can be sent to your provider as well.   To learn more about what you can do with MyChart, go to [email or web address].   Other Instructions None

## 2024-09-06 NOTE — Progress Notes (Addendum)
 " Electrophysiology Office Note:   ID:  Kreig, Parson 1951-02-10, MRN [id number]  Primary Cardiologist: Vinie JAYSON Maxcy, MD Electrophysiologist: OLE ONEIDA HOLTS, MD      History of Present Illness:   Jerry Ruiz is a 73 y.o. male with h/o PVC's, 2:1 AVB s/p PPM, SVT, palpitations, WCT, HTN, HLD, MV disease, OSA on CPAP, pericardial cyst (by CT 2013, not seen on [facility]), obesity seen today for routine electrophysiology follow-up and pre-operative clearance s/p Pacemaker implant.  Over the summer, patient had observed decreased exertional tolerance over a period of months, had been unable to go to the gym. In September of this year, patient noted to have symptomatic bradycardia with ILR showing 2:1 AVB. His Metoprolol  was held but 2:1 AVB persisted prompting close scheduling of PPM implant. Patient underwent successful implant of dual chamber Medtronic PPM with Dr. Inocencio on 06/01/24. ILR removed at time of pacemaker implant.  Since last being seen in our clinic the patient reports doing well. Energy levels have increased and he has not felt the fatigue and dyspnea previously noted while in [facility].  His primary concerns are now back pain and left shoulder pain.   Review of systems complete and found to be negative unless listed in HPI.   EP Information / Studies Reviewed:    EKG is ordered today. Personal review as below.  EKG Interpretation Date/Time:  Monday September 06 2024 09:39:34 EST Ventricular Rate:  78 PR Interval:  178 QRS Duration:  158 QT Interval:  452 QTC Calculation: 515 R Axis:   -24  Text Interpretation: Atrial-sensed ventricular-paced rhythm When compared with ECG of 01-Jun-2024 18:08, Vent. rate has increased BY   4 BPM Confirmed by Trudy Birmingham [phone]) on 09/06/2024 9:43:27 AM    PPM Interrogation-  reviewed in detail today,  See PACEART report.  Arrhythmia/Device History Dual chamber PPM MDT WC   CARELINK ILR MEDTRONIC- Explanted    Physical Exam:   VS:   BP (!) 148/92   Pulse 78   Ht 6' (1.829 m)   Wt 219 lb 12.8 oz (99.7 kg)   SpO2 95%   BMI 29.81 kg/m    Wt Readings from Last 3 Encounters:  09/06/24 219 lb 12.8 oz (99.7 kg)  06/01/24 215 lb (97.5 kg)  05/31/24 217 lb 9.6 oz (98.7 kg)     GEN: No acute distress  NECK: No JVD; No carotid bruits CARDIAC: Regular rate and rhythm, no murmurs, rubs, gallops CHEST: PPM pocket well healed. No redness or swelling.  RESPIRATORY:  Clear to auscultation without rales, wheezing or rhonchi  ABDOMEN: Soft, non-tender, non-distended EXTREMITIES:  No edema; No deformity   ASSESSMENT AND PLAN:    CHB s/p Medtronic PPM  Subclinical paroxysmal afib Normal PPM function. Ventricular dependent at 40bpm.  See Pace Art report No changes today Brief episodes of afib noted (most occurring on 08/20/24), longest ~8 minutes. Total burden <0.1%. Patient asymptomatic. Will continue to monitor burden closely, if increases, would need to start OAC. Isolated NSVT, longest 3 beats.  Pre-operative evaluation Patient pending left reverse total shoulder arthroplasty. Patient with RCRI 0.4% and is able to achieve >4 METS. Therefore, based on ACC/AHA guidelines, patient would be at acceptable risk for the planned procedure without further cardiovascular testing. I will route this recommendation to the requesting party via Epic fax function. Given device dependence, he will need intra-operative pacemaker management/monitoring by Medtronic representative.   Hypertension BP elevated today. Patient reports home readings at goal. Encouraged  follow up with PCP.  PVCs Burden appears to be low s/p PPM implant. Patient V pacing >99%.  OSA Patient reports overall good compliance, though sometimes does not put his mask back on if he wakes up during the night. Encouraged 100% compliance given hypertension and new subclinical afib.      Disposition:   Follow up with EP Team in 12 months  Signed, Artist Pouch, PA-C  "

## 2024-09-16 NOTE — Progress Notes (Signed)
 Jerry Ruiz                                          MRN: [id number]   09/16/2024   The [facility] Quality Team Specialist reviewed this patient medical record for the purposes of chart review for care gap closure. The following were reviewed: chart review for care gap closure-controlling blood pressure.    [facility]BCI Quality Team

## 2024-09-17 ENCOUNTER — Other Ambulatory Visit: Payer: Self-pay | Admitting: Cardiology

## 2024-09-17 ENCOUNTER — Ambulatory Visit: Attending: Cardiology | Admitting: Cardiology

## 2024-09-17 ENCOUNTER — Encounter: Payer: Self-pay | Admitting: Cardiology

## 2024-09-17 VITALS — BP 160/98 | HR 90 | Ht 72.0 in | Wt 214.6 lb

## 2024-09-17 DIAGNOSIS — I2510 Atherosclerotic heart disease of native coronary artery without angina pectoris: Secondary | ICD-10-CM | POA: Diagnosis not present

## 2024-09-17 DIAGNOSIS — R079 Chest pain, unspecified: Secondary | ICD-10-CM

## 2024-09-17 DIAGNOSIS — E785 Hyperlipidemia, unspecified: Secondary | ICD-10-CM

## 2024-09-17 NOTE — Progress Notes (Signed)
 " Cardiology Office Note:  .   Date:  09/17/2024  ID:  Jerry Ruiz, DOB [birth year], MRN [id number] PCP: Charlott Dorn LABOR, MD  [facility] Providers Cardiologist:  Vinie JAYSON Maxcy, MD Electrophysiologist:  OLE ONEIDA HOLTS, MD (Inactive)    History of Present Illness: .   Jerry Ruiz is a 74 y.o. 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 Referred by Dr. Inocencio for Medtronic pacemaker follow-up hyperlipidemia, coronary calcium score elevation 298.  He had a Medtronic pacemaker placed and previously had an implantable loop recorder, which was removed at the time of pacemaker implantation. He feels good overall but notes a lack of energy and some short-term memory issues since his back surgery and pacemaker placement. He reports that his gait is not as steady as it used to be.  Also reports worsening shoulder pain, exacerbated after pacemaker implantation.  He has a history of obstructive sleep apnea and reports good compliance with CPAP therapy. He mentions a prior hand tremor and that he stopped taking amiodarone  in 2022.  His LDL was previously 118, and he had an elevated coronary calcium score of 298, which was in the 65th percentile in 2022. He has seen a lipid expert, Dr. Maxcy, but is not currently on any cholesterol-lowering agents. He is concerned about taking statins due to a family history of Parkinson's disease and has opted not to take them.  He told me that he has watched relatives die from Parkinson's disease and believes that statins played a role in their demise.  He reports a significant weight loss of over 30 pounds, which he attributes to muscle loss following his back surgery and a period of being in a wheelchair.       Studies Reviewed: .        Results Radiology Coronary calcium score  (08/27/2024): Coronary artery calcification, score 363, 63rd percentile. Prior (2022): score 298, 65th percentile.  Diagnostic Echocardiogram (2023): Normal left ventricular systolic function. Risk Assessment/Calculations:           Physical Exam:   VS:  BP (!) 160/98   Pulse 90   Ht 6' (1.829 m)   Wt 214 lb 9.6 oz (97.3 kg)   SpO2 95%   BMI 29.10 kg/m    Wt Readings from Last 3 Encounters:  09/17/24 214 lb 9.6 oz (97.3 kg)  09/06/24 219 lb 12.8 oz (99.7 kg)  06/01/24 215 lb (97.5 kg)    GEN: Well nourished, well developed in no acute distress NECK: No JVD; No carotid bruits CARDIAC: RRR, no murmurs, no rubs, no gallops RESPIRATORY:  Clear to auscultation without rales, wheezing or rhonchi  ABDOMEN: Soft, non-tender, non-distended EXTREMITIES:  No edema; No deformity   ASSESSMENT AND PLAN: .    Assessment and Plan Assessment & Plan Cardiac arrhythmias with pacemaker Pacemaker functioning well with no chest pain. Previous SVT palpitations and two-to-one AV block. No current symptoms of arrhythmia. Short-term memory issues post-surgery, possibly related to anesthesia, but not likely attributed to pacemaker. - Continue pacemaker management with Dr. Inocencio.  Atherosclerotic heart disease with elevated coronary calcium score Coronary calcium score of 363 (63rd percentile) indicating calcification in heart arteries.  Personally reviewed CT scan.  No current chest pain or shortness of breath. Previous echocardiogram showed excellent pump function. Concerns about  potential impact on upcoming shoulder surgery. He is hesitant to use statins due to family history of Parkinson's disease and prefers non-statin options. - Ordered nuclear stress test to assess cardiac function and risk. - Will discuss potential need for further investigation if stress test indicates high risk. - I asked him to consider statins once again.  He refuses.  I asked him to consider Repatha he refuses.  Does not  desire to be on statin medication.  Has done a lot of reading on this.  He also went to a conference in [place] , health conference, spoke to a cardiologist there.  He has an upcoming visit with that cardiologist.  He states that the cardiologist was not in favor of statin medication.  It will be a [employer] teacher.  He asked what we could do about blood pressure.  In review of office note from 11/12/2023 here in the cardiology office, he has recently stopped a couple blood pressure medications on his own noting that his blood pressure particular the diastolic was getting low .  He does believe that at home his blood pressure is relatively normal. Dr. Charlott can continue to follow his blood pressure medications.  He states that he was taken off of them at one point.  I will see him back as needed.  He may follow-up with Dr. Mona if desired.  We will make sure his stress test is done and we will follow-up with these results for him, to help  stratify him for potential upcoming shoulder surgery.  He can continue to follow-up with electrophysiology for pacemaker.  Unfortunately was not receptive to traditional guideline directed therapies.  Understands potential risks of future heart attacks, strokes.  He is interested in potential therapies offered by cardiologist he met in [place]  at health conference.    Informed Consent   Shared Decision Making/Informed Consent The risks [chest pain, shortness of breath, cardiac arrhythmias, dizziness, blood pressure fluctuations, myocardial infarction, stroke/transient ischemic attack, nausea, vomiting, allergic reaction, radiation exposure, metallic taste sensation and life-threatening complications (estimated to be 1 in 10,000)], benefits (risk stratification, diagnosing coronary artery disease, treatment guidance) and alternatives of a nuclear stress test were discussed in detail with Jerry Ruiz and he agrees to proceed.       Signed, Oneil Parchment, MD  "

## 2024-09-17 NOTE — Progress Notes (Deleted)
" °  Cardiology Office Note:  .   Date:  09/17/2024  ID:  Madeleine LITTIE Arenas, DOB [birth year], MRN [id number] PCP: Charlott Dorn LABOR, MD  [facility] Providers Cardiologist:  Vinie JAYSON Maxcy, MD Electrophysiologist:  OLE ONEIDA HOLTS, MD (Inactive) { Click to update primary MD,subspecialty MD or APP then REFRESH:1}   History of Present Illness: .   Jerry Ruiz is a 74 y.o. male Discussed the use of AI scribe software for clinical note transcription with the patient, who gave verbal consent to proceed.  History of Present Illness       ROS: ***  Studies Reviewed: .        Results  Risk Assessment/Calculations:   {Does this patient have ATRIAL FIBRILLATION?:[id number]} The patient's 1st BP is elevated (>139/89)*** Repeat BP and {Click to enter a 2nd BP Refresh Note  :1}       Physical Exam:   VS:  BP (!) 160/98   Pulse 90   Ht 6' (1.829 m)   Wt 214 lb 9.6 oz (97.3 kg)   SpO2 95%   BMI 29.10 kg/m    Wt Readings from Last 3 Encounters:  09/17/24 214 lb 9.6 oz (97.3 kg)  09/06/24 219 lb 12.8 oz (99.7 kg)  06/01/24 215 lb (97.5 kg)    GEN: Well nourished, well developed in no acute distress NECK: No JVD; No carotid bruits CARDIAC: ***RRR, no murmurs, no rubs, no gallops RESPIRATORY:  Clear to auscultation without rales, wheezing or rhonchi  ABDOMEN: Soft, non-tender, non-distended EXTREMITIES:  No edema; No deformity   ASSESSMENT AND PLAN: .    Assessment and Plan Assessment & Plan        {Are you ordering a CV Procedure (e.g. stress test, cath, DCCV, TEE, etc)?   Press F2        :[id number]}  Dispo: ***  Signed, Oneil Parchment, MD  "

## 2024-09-17 NOTE — Patient Instructions (Signed)
 Medication Instructions:  The current medical regimen is effective;  continue present plan and medications.  *If you need a refill on your cardiac medications before your next appointment, please call your pharmacy*   Testing/Procedures: You are scheduled for a Lexiscan  Myocardial Perfusion Imaging Study on       at        .   Please arrive 15 minutes prior to your appointment time for registration and insurance purposes.   The test will take approximately 3 to 4 hours to complete; you may bring reading material. If someone comes with you to your appointment, they will need to remain in the main lobby due to limited space in the testing area.   If you are pregnant or breastfeeding, please notify the nuclear lab prior to your appointment.   How to prepare for your Lexiscan  Myocardial Perfusion test:   Do not eat or drink 3 hours prior to your test, except you may have water.    Do not consume products containing caffeine (regular or decaffeinated) 12 hours prior to your test (ex: coffee, chocolate, soda, tea)   Do bring a list of your current medications with you. If not listed below, you may take your medications as normal.   Bring any held medication to your appointment, as you may be required to take it once the test is complete.   Do wear comfortable clothes (no dresses or overalls) and walking shoes. Tennis shoes are preferred. No heels or open toed shoes.  Do not wear cologne, perfume, aftershave or lotions (deodorant is allowed).   If these instructions are not followed, you test will have to be rescheduled.   Please report to [address], [place], [place] for your test. If you have questions or concerns about your appointment, please call the Nuclear Lab at #[phone].  If you cannot keep your appointment, please provide 24 hour notification to the Nuclear lab to avoid a possible $50 charge to your account.     Follow-Up: At [facility], you and your health  needs are our priority.  As part of our continuing mission to provide you with exceptional heart care, our providers are all part of one team.  This team includes your primary Cardiologist (physician) and Advanced Practice Providers or APPs (Physician Assistants and Nurse Practitioners) who all work together to provide you with the care you need, when you need it.  Your next appointment:   Follow up as needed with Dr Jeffrie ' ' We recommend signing up for the patient portal called MyChart.  Sign up information is provided on this After Visit Summary.  MyChart is used to connect with patients for Virtual Visits (Telemedicine).  Patients are able to view lab/test results, encounter notes, upcoming appointments, etc.  Non-urgent messages can be sent to your provider as well.   To learn more about what you can do with MyChart, go to [email or web address].

## 2024-09-20 ENCOUNTER — Telehealth (HOSPITAL_COMMUNITY): Payer: Self-pay | Admitting: *Deleted

## 2024-09-20 NOTE — Telephone Encounter (Signed)
 Patient given detailed instructions per Myocardial Perfusion Study Information Sheet for the test on 09/24/23  Patient notified to arrive 15 minutes early and that it is imperative to arrive on time for appointment to keep from having the test rescheduled.  If you need to cancel or reschedule your appointment, please call the office within 24 hours of your appointment. . Patient verbalized understanding.Claudene Ronal Quale, RN

## 2024-09-23 ENCOUNTER — Ambulatory Visit (HOSPITAL_COMMUNITY)
Admission: RE | Admit: 2024-09-23 | Discharge: 2024-09-23 | Disposition: A | Discharge status: Home / Self Care | Source: Ambulatory Visit | Attending: Internal Medicine | Admitting: Internal Medicine

## 2024-09-23 DIAGNOSIS — E785 Hyperlipidemia, unspecified: Secondary | ICD-10-CM | POA: Diagnosis present

## 2024-09-23 DIAGNOSIS — R079 Chest pain, unspecified: Secondary | ICD-10-CM | POA: Insufficient documentation

## 2024-09-23 DIAGNOSIS — I2510 Atherosclerotic heart disease of native coronary artery without angina pectoris: Secondary | ICD-10-CM | POA: Insufficient documentation

## 2024-09-23 LAB — MYOCARDIAL PERFUSION IMAGING
LV dias vol: 173 mL (ref 62–150)
LV sys vol: 81 mL
Nuc Stress EF: 53 %
Peak HR: 95 {beats}/min
Rest HR: 74 {beats}/min
Rest Nuclear Isotope Dose: 9.2 mCi
SDS: 3
SRS: 4
SSS: 5
ST Depression (mm): 0 mm
Stress Nuclear Isotope Dose: 26.9 mCi
TID: 1.03

## 2024-09-23 MED ORDER — REGADENOSON 0.4 MG/5ML IV SOLN
0.4000 mg | Freq: Once | INTRAVENOUS | Status: AC
  Administered 2024-09-23: 0.4 mg via INTRAVENOUS

## 2024-09-23 MED ORDER — TECHNETIUM TC 99M TETROFOSMIN IV KIT
26.9000 | PACK | Freq: Once | INTRAVENOUS | Status: AC | PRN
  Administered 2024-09-23: 26.9 via INTRAVENOUS

## 2024-09-23 MED ORDER — REGADENOSON 0.4 MG/5ML IV SOLN
INTRAVENOUS | Status: AC
  Filled 2024-09-23: qty 5

## 2024-09-23 MED ORDER — TECHNETIUM TC 99M TETROFOSMIN IV KIT
9.2000 | PACK | Freq: Once | INTRAVENOUS | Status: AC | PRN
  Administered 2024-09-23: 9.2 via INTRAVENOUS

## 2024-09-27 ENCOUNTER — Ambulatory Visit: Payer: Self-pay | Admitting: Cardiology

## 2024-09-29 ENCOUNTER — Encounter: Payer: Self-pay | Admitting: Cardiology

## 2024-09-29 NOTE — Telephone Encounter (Addendum)
 Device Clearance completed in EPIC and faxed back to [facility].

## 2024-09-29 NOTE — Progress Notes (Signed)
 PERIOPERATIVE PRESCRIPTION FOR IMPLANTED CARDIAC DEVICE PROGRAMMING  Patient Information: Name:  Jerry Ruiz  DOB:  Aug 05, 1951  MRN:  [id number]   Procedure: Left reverse total shoulder arthroplasty    Date of Surgery: Clearance TBD                                  Surgeon:  Dr. kay Socks Group or Practice Name:  [facility] Phone number:  [phone] Fax number:  [phone]   Type of Clearance Requested:   - Medical    Type of Anesthesia:  Choice   Additional requests/questions:    Device Information:  Clinic EP Physician:  Soyla Norton, MD   Device Type:  Pacemaker Manufacturer and Phone #:  Medtronic: [phone] Pacemaker Dependent?:  Yes.   Date of Last Device Check: 09/06/2025  Normal Device Function?:  Yes.    Electrophysiologist's Recommendations:  Have magnet available. Provide continuous ECG monitoring when magnet is used or reprogramming is to be performed.  Procedure will likely interfere with device function.  Device should be programmed:  Asynchronous pacing during procedure and returned to normal programming after procedure Will need intra-operative pacemaker management/monitoring by Medtronic Representative.   Per Device Clinic [address] [place], [place]  9:40 AM 09/29/2024

## 2024-09-29 NOTE — Telephone Encounter (Signed)
" ° °  Patient Name: Jerry Ruiz  DOB: 1951-07-26 MRN: [id number]  Primary Cardiologist: Vinie JAYSON Maxcy, MD  Chart reviewed as part of pre-operative protocol coverage. Pre-op  clearance already addressed by colleagues in earlier notes. To summarize recommendations:  - Per Artist Pouch, PA-C with electrophysiology team, Pre-operative evaluation Patient pending left reverse total shoulder arthroplasty. Patient with RCRI 0.4% and is able to achieve >4 METS. Therefore, based on ACC/AHA guidelines, patient would be at acceptable risk for the planned procedure without further cardiovascular testing. I will route this recommendation to the requesting party via Epic fax function. Given device dependence, he will need intra-operative pacemaker management/monitoring by Medtronic representative.  - The patient was also evaluated by Dr. Jeffrie. Per his review, Overall low to intermediate risk stress test. Ejection fraction 53% normal. OK to proceed with upcoming shoulder surgery.   I will route this clearance to device team in case there is any other documentation needed for the device clearance.  Will route this bundled recommendation to requesting provider via Epic fax function and remove from pre-op  pool. Please call with questions.  Oakleigh Hesketh N Amaad Byers, PA-C 09/29/2024, 9:22 AM  "

## 2024-10-01 NOTE — Progress Notes (Signed)
 Sent message, via epic in basket, requesting orders in epic from Careers adviser.

## 2024-10-05 NOTE — Patient Instructions (Addendum)
 SURGICAL WAITING ROOM VISITATION Patients having surgery or a procedure may have no more than 2 support people in the waiting area - these visitors may rotate.    Children under the age of 24 will not be allowed to visit due to the increase in respiratory illness  Children under the age of 59 must have an adult with them who is not the patient.  If the patient needs to stay at the hospital during part of their recovery, the visitor guidelines for inpatient rooms apply. Pre-op  nurse will coordinate an appropriate time for 1 support person to accompany patient in pre-op .  This support person may not rotate.    Please refer to the [facility] website for the visitor guidelines for Inpatients (after your surgery is over and you are in a regular room).       Your procedure is scheduled on: 10-14-24   Report to [facility] Main Entrance    Report to admitting at 8:00 AM   Call this number if you have problems the morning of surgery [phone]   Do not eat food :After Midnight.   After Midnight you may have the following liquids until 7:30 AM DAY OF SURGERY  Water  Non-Citrus Juices (without pulp, NO RED-Apple, White grape, White cranberry) Black Coffee (NO MILK/CREAM OR CREAMERS, sugar ok)  Clear Tea (NO MILK/CREAM OR CREAMERS, sugar ok) regular and decaf                             Plain Jell-O (NO RED)                                           Fruit ices (not with fruit pulp, NO RED)                                     Popsicles (NO RED)                                                               Sports drinks like Gatorade (NO RED)                   The day of surgery:  Drink ONE (1) Pre-Surgery Clear Ensure by 7:30 AM the morning of surgery. Drink in one sitting. Do not sip.  This drink was given to you during your hospital  pre-op  appointment visit. Nothing else to drink after completing the Pre-Surgery Clear Ensure.          If you have questions, please contact  your surgeons office.   FOLLOW  ANY ADDITIONAL PRE OP INSTRUCTIONS YOU RECEIVED FROM YOUR SURGEON'S OFFICE!!!     Oral Hygiene is also important to reduce your risk of infection.                                    Remember - BRUSH YOUR TEETH THE MORNING OF SURGERY WITH YOUR REGULAR TOOTHPASTE   Do NOT smoke after Midnight  Take these medicines the morning of surgery with A SIP OF WATER :    Tramadol if needed  Stop all vitamins and herbal supplements 7 days before surgery  Do not take Lisinopril  day of surgery  Bring CPAP mask and tubing day of surgery.                              You may not have any metal on your body including  jewelry, and body piercing             Do not wear lotions, powders, cologne, or deodorant              Men may shave face and neck.   Do not bring valuables to the hospital. [facility] IS NOT RESPONSIBLE   FOR VALUABLES.   Contacts, dentures or bridgework may not be worn into surgery.  DO NOT BRING YOUR HOME MEDICATIONS TO THE HOSPITAL. PHARMACY WILL DISPENSE MEDICATIONS LISTED ON YOUR MEDICATION LIST TO YOU DURING YOUR ADMISSION IN THE HOSPITAL!    Patients discharged on the day of surgery will not be allowed to drive home.  Someone NEEDS to stay with you for the first 24 hours after anesthesia.              Please read over the following fact sheets you were given: IF YOU HAVE QUESTIONS ABOUT YOUR PRE-OP  INSTRUCTIONS PLEASE CALL [phone] Gwen  If you received a COVID test during your pre-op  visit  it is requested that you wear a mask when out in public, stay away from anyone that may not be feeling well and notify your surgeon if you develop symptoms. If you test positive for Covid or have been in contact with anyone that has tested positive in the last 10 days please notify you surgeon.   [place]- Preparing for Total Shoulder Arthroplasty    Before surgery, you can play an important role. Because skin is not sterile, your skin needs  to be as free of germs as possible. You can reduce the number of germs on your skin by using the following products. Benzoyl Peroxide Gel Reduces the number of germs present on the skin Applied twice a day to shoulder area starting two days before surgery    ==================================================================  Please follow these instructions carefully:  BENZOYL PEROXIDE 5% GEL  Please do not use if you have an allergy to benzoyl peroxide.   If your skin becomes reddened/irritated stop using the benzoyl peroxide.  Starting two days before surgery, apply as follows: Apply benzoyl peroxide in the morning and at night. Apply after taking a shower. If you are not taking a shower clean entire shoulder front, back, and side along with the armpit with a clean wet washcloth.  Place a quarter-sized dollop on your shoulder and rub in thoroughly, making sure to cover the front, back, and side of your shoulder, along with the armpit.   2 days before (10-12-24) ____ AM   ____ PM              1 day before (10-13-24)___ AM   ____ PM                         Do this twice a day for two days.  (Last application is the night before surgery, AFTER using the CHG soap as described below).  Do NOT apply benzoyl peroxide gel on  the day of surgery.    Pre-operative 4 CHG Bath Instructions  DYNA-Hex 4 Chlorhexidine  Gluconate 4% Solution Antiseptic 4 fl. oz   You can play a key role in reducing the risk of infection after surgery. Your skin needs to be as free of germs as possible. You can reduce the number of germs on your skin by washing with CHG (chlorhexidine  gluconate) soap before surgery. CHG is an antiseptic soap that kills germs and continues to kill germs even after washing.   DO NOT use if you have an allergy to chlorhexidine /CHG or antibacterial soaps. If your skin becomes reddened or irritated, stop using the CHG and notify one of our RNs at   Please shower with the CHG soap starting 4  days before surgery using the following schedule:     Please keep in mind the following:  DO NOT shave, including legs and underarms, starting the day of your first shower.   You may shave your face at any point before/day of surgery.  Place clean sheets on your bed the day you start using CHG soap. Use a clean washcloth (not used since being washed) for each shower. DO NOT sleep with pets once you start using the CHG.  CHG Shower Instructions:  If you choose to wash your hair and private area, wash first with your normal shampoo/soap.  After you use shampoo/soap, rinse your hair and body thoroughly to remove shampoo/soap residue.  Turn the water  OFF and apply about 3 tablespoons (45 ml) of CHG soap to a CLEAN washcloth.  Apply CHG soap ONLY FROM YOUR NECK DOWN TO YOUR TOES (washing for 3-5 minutes)  DO NOT use CHG soap on face, private areas, open wounds, or sores.  Pay special attention to the area where your surgery is being performed.  If you are having back surgery, having someone wash your back for you may be helpful. Wait 2 minutes after CHG soap is applied, then you may rinse off the CHG soap.  Pat dry with a clean towel  Put on clean clothes/pajamas   If you choose to wear lotion, please use ONLY the CHG-compatible lotions on the back of this paper.     Additional instructions for the day of surgery:  Shower with regular soap the day of surgery DO NOT APPLY any lotions, deodorants, cologne, or perfumes.   Put on clean/comfortable clothes.  Brush your teeth.  Ask your nurse before applying any prescription medications to the skin.   CHG Compatible Lotions   Aveeno Moisturizing lotion  Cetaphil Moisturizing Cream  Cetaphil Moisturizing Lotion  Clairol Herbal Essence Moisturizing Lotion, Dry Skin  Clairol Herbal Essence Moisturizing Lotion, Extra Dry Skin  Clairol Herbal Essence Moisturizing Lotion, Normal Skin  Curel Age Defying Therapeutic Moisturizing Lotion with Alpha  Hydroxy  Curel Extreme Care Body Lotion  Curel Soothing Hands Moisturizing Hand Lotion  Curel Therapeutic Moisturizing Cream, Fragrance-Free  Curel Therapeutic Moisturizing Lotion, Fragrance-Free  Curel Therapeutic Moisturizing Lotion, Original Formula  Eucerin Daily Replenishing Lotion  Eucerin Dry Skin Therapy Plus Alpha Hydroxy Crme  Eucerin Dry Skin Therapy Plus Alpha Hydroxy Lotion  Eucerin Original Crme  Eucerin Original Lotion  Eucerin Plus Crme Eucerin Plus Lotion  Eucerin TriLipid Replenishing Lotion  Keri Anti-Bacterial Hand Lotion  Keri Deep Conditioning Original Lotion Dry Skin Formula Softly Scented  Keri Deep Conditioning Original Lotion, Fragrance Free Sensitive Skin Formula  Keri Lotion Fast Absorbing Fragrance Free Sensitive Skin Formula  Keri Lotion Fast Absorbing Softly Scented Dry Skin  Formula  Keri Original Lotion  Keri Skin Renewal Lotion Keri Silky Smooth Lotion  Keri Silky Smooth Sensitive Skin Lotion  Nivea Body Creamy Conditioning Oil  Nivea Body Extra Enriched Lotion  Nivea Body Original Lotion  Nivea Body Sheer Moisturizing Lotion Nivea Crme  Nivea Skin Firming Lotion  NutraDerm 30 Skin Lotion  NutraDerm Skin Lotion  NutraDerm Therapeutic Skin Cream  NutraDerm Therapeutic Skin Lotion  ProShield Protective Hand Cream  Provon moisturizing lotion   PATIENT SIGNATURE_________________________________  NURSE SIGNATURE__________________________________  ________________________________________________________________________    Nasario Exon  An incentive spirometer is a tool that can help keep your lungs clear and active. This tool measures how well you are filling your lungs with each breath. Taking long deep breaths may help reverse or decrease the chance of developing breathing (pulmonary) problems (especially infection) following: A long period of time when you are unable to move or be active. BEFORE THE PROCEDURE  If the spirometer  includes an indicator to show your best effort, your nurse or respiratory therapist will set it to a desired goal. If possible, sit up straight or lean slightly forward. Try not to slouch. Hold the incentive spirometer in an upright position. INSTRUCTIONS FOR USE  Sit on the edge of your bed if possible, or sit up as far as you can in bed or on a chair. Hold the incentive spirometer in an upright position. Breathe out normally. Place the mouthpiece in your mouth and seal your lips tightly around it. Breathe in slowly and as deeply as possible, raising the piston or the ball toward the top of the column. Hold your breath for 3-5 seconds or for as long as possible. Allow the piston or ball to fall to the bottom of the column. Remove the mouthpiece from your mouth and breathe out normally. Rest for a few seconds and repeat Steps 1 through 7 at least 10 times every 1-2 hours when you are awake. Take your time and take a few normal breaths between deep breaths. The spirometer may include an indicator to show your best effort. Use the indicator as a goal to work toward during each repetition. After each set of 10 deep breaths, practice coughing to be sure your lungs are clear. If you have an incision (the cut made at the time of surgery), support your incision when coughing by placing a pillow or rolled up towels firmly against it. Once you are able to get out of bed, walk around indoors and cough well. You may stop using the incentive spirometer when instructed by your caregiver.  RISKS AND COMPLICATIONS Take your time so you do not get dizzy or light-headed. If you are in pain, you may need to take or ask for pain medication before doing incentive spirometry. It is harder to take a deep breath if you are having pain. AFTER USE Rest and breathe slowly and easily. It can be helpful to keep track of a log of your progress. Your caregiver can provide you with a simple table to help with this. If you are  using the spirometer at home, follow these instructions: SEEK MEDICAL CARE IF:  You are having difficultly using the spirometer. You have trouble using the spirometer as often as instructed. Your pain medication is not giving enough relief while using the spirometer. You develop fever of 100.5 F (38.1 C) or higher. SEEK IMMEDIATE MEDICAL CARE IF:  You cough up bloody sputum that had not been present before. You develop fever of 102 F (38.9  C) or greater. You develop worsening pain at or near the incision site. MAKE SURE YOU:  Understand these instructions. Will watch your condition. Will get help right away if you are not doing well or get worse. Document Released: 01/06/2007 Document Revised: 11/18/2011 Document Reviewed: 03/09/2007 [facility] Patient Information 2014 [place], [place].

## 2024-10-05 NOTE — Progress Notes (Addendum)
 Date of COVID positive in last 90 days:  No  PCP - Frederich Sauers, MD Cardiologist - Oneil Parchment, MD EP - Soyla Norton, MD  Chest x-ray - 06-01-24 Epic EKG - 09-06-24 Epic Stress Test - 09-23-24 Epic ECHO - 05-31-22 Epic Cardiac Cath - N/A Cardiac CT - 08-27-24 Epic Pacemaker/ICD device last checked:09-06-24 Epic ([employer]) Device orders in Epic and rep notified, received confirmaton Spinal Cord Stimulator:N/A  Bowel Prep - N/A  Sleep Study - Yes, +sleep apnea CPAP - Yes, sometimes  Fasting Blood Sugar - N/A Checks Blood Sugar _____ times a day  Last dose of GLP1 agonist-  N/A GLP1 instructions:  Do not take after     Last dose of SGLT-2 inhibitors-  N/A SGLT-2 instructions:  Do not take after    Blood Thinner Instructions: N/A Last dose:   Time: Aspirin Instructions:N/A Last Dose:  Activity level:  Can go up a flight of stairs and perform activities of daily living without stopping and without symptoms of chest pain or shortness of breath.  Able to exercise without symptoms  Anesthesia review: Pacemaker, HTN, OSA  Patient denies shortness of breath, fever, cough and chest pain at PAT appointment  Patient verbalized understanding of instructions that were given to them at the PAT appointment. Patient was also instructed that they will need to review over the PAT instructions again at home before surgery.

## 2024-10-08 ENCOUNTER — Other Ambulatory Visit: Payer: Self-pay

## 2024-10-08 ENCOUNTER — Encounter (HOSPITAL_COMMUNITY)
Admission: RE | Admit: 2024-10-08 | Discharge: 2024-10-08 | Disposition: A | Source: Ambulatory Visit | Attending: Orthopedic Surgery | Admitting: Orthopedic Surgery

## 2024-10-08 ENCOUNTER — Encounter (HOSPITAL_COMMUNITY): Payer: Self-pay

## 2024-10-08 VITALS — BP 134/83 | HR 74 | Temp 98.0°F | Resp 12 | Ht 72.0 in | Wt 208.4 lb

## 2024-10-08 DIAGNOSIS — I10 Essential (primary) hypertension: Secondary | ICD-10-CM

## 2024-10-08 DIAGNOSIS — Z01818 Encounter for other preprocedural examination: Secondary | ICD-10-CM

## 2024-10-08 LAB — BASIC METABOLIC PANEL WITH GFR
Anion gap: 10 (ref 5–15)
BUN: 18 mg/dL (ref 8–23)
CO2: 26 mmol/L (ref 22–32)
Calcium: 9.6 mg/dL (ref 8.9–10.3)
Chloride: 104 mmol/L (ref 98–111)
Creatinine, Ser: 1 mg/dL (ref 0.61–1.24)
GFR, Estimated: >60 mL/min
Glucose, Bld: 96 mg/dL (ref 70–99)
Potassium: 4.2 mmol/L (ref 3.5–5.1)
Sodium: 139 mmol/L (ref 135–145)

## 2024-10-08 LAB — CBC
HCT: 49.5 % (ref 39.0–52.0)
Hemoglobin: 16 g/dL (ref 13.0–17.0)
MCH: 28.4 pg (ref 26.0–34.0)
MCHC: 32.3 g/dL (ref 30.0–36.0)
MCV: 87.8 fL (ref 80.0–100.0)
Platelets: 259 10*3/uL (ref 150–400)
RBC: 5.64 MIL/uL (ref 4.22–5.81)
RDW: 15.6 % — ABNORMAL HIGH (ref 11.5–15.5)
WBC: 8.8 10*3/uL (ref 4.0–10.5)
nRBC: 0 % (ref 0.0–0.2)

## 2024-10-08 LAB — SURGICAL PCR SCREEN
MRSA, PCR: NEGATIVE
Staphylococcus aureus: NEGATIVE

## 2024-10-11 ENCOUNTER — Encounter (HOSPITAL_COMMUNITY): Payer: Self-pay

## 2024-10-13 NOTE — Anesthesia Preprocedure Evaluation (Addendum)
"                                    Anesthesia Evaluation  Patient identified by MRN, date of birth, ID band Patient awake    Reviewed: Allergy & Precautions, NPO status , Patient's Chart, lab work & pertinent test results  History of Anesthesia Complications Negative for: history of anesthetic complications  Airway Mallampati: II  TM Distance: >3 FB Neck ROM: Full    Dental  (+) Teeth Intact, Dental Advisory Given   Pulmonary sleep apnea and Continuous Positive Airway Pressure Ventilation , pneumonia, former smoker   breath sounds clear to auscultation       Cardiovascular hypertension (Lisinopril ), Pt. 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 Left ventricular ejection fraction, by estimation, is 60 to 65%. The  left ventricle has normal function. The left ventricle has no regional  wall motion abnormalities. There is moderate concentric left ventricular  hypertrophy. Left ventricular  diastolic parameters were normal.   2. Right ventricular systolic function is normal. The right ventricular  size is normal.   3. Left atrial size was mild to moderately dilated.   4. The mitral valve is normal in structure. Trivial mitral valve  regurgitation. No evidence of mitral stenosis.   5. The aortic valve is tricuspid. Aortic valve regurgitation is mild. No  aortic stenosis is present.   6. The inferior vena cava is normal in size with greater than 50%  respiratory variability, suggesting right atrial pressure of 3 mmHg.     Neuro/Psych  Headaches  negative psych ROS   GI/Hepatic negative GI ROS, Neg liver ROS,,,  Endo/Other    Class 3 obesity  Renal/GU negative Renal ROS     Musculoskeletal  (+) Arthritis ,    Abdominal   Peds  Hematology negative hematology ROS (+)   Anesthesia Other Findings   Reproductive/Obstetrics                               Anesthesia Physical Anesthesia Plan  ASA: 3  Anesthesia Plan: General and Regional   Post-op Pain Management: Tylenol  PO (pre-op )*, Celebrex  PO (pre-op )*, Minimal or no pain anticipated and Regional block*   Induction: Intravenous  PONV Risk Score and Plan: 3 and Ondansetron , Dexamethasone  and Midazolam   Airway Management Planned: Oral ETT  Additional Equipment: ClearSight  Intra-op Plan:   Post-operative Plan: Extubation in OR  Informed Consent: I have reviewed the patients History and Physical, chart, labs and discussed the procedure including the risks, benefits and alternatives for the proposed anesthesia with the patient or authorized representative who has indicated his/her understanding and acceptance.     Dental advisory given  Plan Discussed with: CRNA and Surgeon  Anesthesia Plan Comments:          Anesthesia Quick Evaluation  "

## 2024-10-14 ENCOUNTER — Encounter (HOSPITAL_COMMUNITY): Payer: Self-pay | Admitting: Orthopedic Surgery

## 2024-10-14 ENCOUNTER — Encounter (HOSPITAL_COMMUNITY): Payer: Self-pay | Admitting: Anesthesiology

## 2024-10-14 ENCOUNTER — Encounter (HOSPITAL_COMMUNITY): Admission: RE | Disposition: A | Payer: Self-pay | Source: Ambulatory Visit | Attending: Orthopedic Surgery

## 2024-10-14 ENCOUNTER — Encounter (HOSPITAL_COMMUNITY): Payer: Self-pay | Admitting: Medical

## 2024-10-14 ENCOUNTER — Ambulatory Visit (HOSPITAL_COMMUNITY)
Admission: RE | Admit: 2024-10-14 | Discharge: 2024-10-14 | Disposition: A | Source: Ambulatory Visit | Attending: Orthopedic Surgery | Admitting: Orthopedic Surgery

## 2024-10-14 MED ORDER — VANCOMYCIN HCL 1000 MG IV SOLR
INTRAVENOUS | Status: AC
  Filled 2024-10-14: qty 20

## 2024-10-14 MED ORDER — ONDANSETRON HCL 4 MG/2ML IJ SOLN
INTRAMUSCULAR | Status: DC | PRN
  Administered 2024-10-14: 4 mg via INTRAVENOUS

## 2024-10-14 MED ORDER — FENTANYL CITRATE (PF) 50 MCG/ML IJ SOSY
50.0000 ug | PREFILLED_SYRINGE | INTRAMUSCULAR | Status: AC
  Administered 2024-10-14: 100 ug via INTRAVENOUS
  Filled 2024-10-14: qty 2

## 2024-10-14 MED ORDER — FENTANYL CITRATE (PF) 100 MCG/2ML IJ SOLN
INTRAMUSCULAR | Status: DC | PRN
  Administered 2024-10-14 (×2): 50 ug via INTRAVENOUS

## 2024-10-14 MED ORDER — TRANEXAMIC ACID-NACL 1000-0.7 MG/100ML-% IV SOLN
1000.0000 mg | INTRAVENOUS | Status: AC
  Administered 2024-10-14: 1000 mg via INTRAVENOUS
  Filled 2024-10-14: qty 100

## 2024-10-14 MED ORDER — PHENYLEPHRINE 80 MCG/ML (10ML) SYRINGE FOR IV PUSH (FOR BLOOD PRESSURE SUPPORT)
PREFILLED_SYRINGE | INTRAVENOUS | Status: DC | PRN
  Administered 2024-10-14: 120 ug via INTRAVENOUS

## 2024-10-14 MED ORDER — PROPOFOL 10 MG/ML IV BOLUS
INTRAVENOUS | Status: DC | PRN
  Administered 2024-10-14: 100 mg via INTRAVENOUS
  Administered 2024-10-14: 50 mg via INTRAVENOUS

## 2024-10-14 MED ORDER — ROCURONIUM BROMIDE 100 MG/10ML IV SOLN
INTRAVENOUS | Status: DC | PRN
  Administered 2024-10-14: 70 mg via INTRAVENOUS
  Administered 2024-10-14: 15 mg via INTRAVENOUS

## 2024-10-14 MED ORDER — MEPERIDINE HCL 25 MG/ML IJ SOLN: 6.2500 mg | INTRAMUSCULAR | Status: DC | PRN

## 2024-10-14 MED ORDER — LIDOCAINE HCL (PF) 2 % IJ SOLN
INTRAMUSCULAR | Status: AC
  Filled 2024-10-14: qty 5

## 2024-10-14 MED ORDER — PHENYLEPHRINE HCL-NACL 20-0.9 MG/250ML-% IV SOLN
INTRAVENOUS | Status: DC | PRN
  Administered 2024-10-14: 30 ug/min via INTRAVENOUS

## 2024-10-14 MED ORDER — LEVOFLOXACIN IN D5W 500 MG/100ML IV SOLN
500.0000 mg | INTRAVENOUS | Status: DC
  Filled 2024-10-14: qty 100

## 2024-10-14 MED ORDER — OXYCODONE HCL 5 MG PO TABS
ORAL_TABLET | ORAL | Status: AC
  Filled 2024-10-14: qty 1

## 2024-10-14 MED ORDER — CEFAZOLIN SODIUM-DEXTROSE 2-4 GM/100ML-% IV SOLN
2.0000 g | Freq: Once | INTRAVENOUS | Status: AC
  Administered 2024-10-14: 2 g via INTRAVENOUS

## 2024-10-14 MED ORDER — PROPOFOL 10 MG/ML IV BOLUS
INTRAVENOUS | Status: AC
  Filled 2024-10-14: qty 20

## 2024-10-14 MED ORDER — SUGAMMADEX SODIUM 200 MG/2ML IV SOLN
INTRAVENOUS | Status: AC
  Filled 2024-10-14: qty 2

## 2024-10-14 MED ORDER — ONDANSETRON HCL 4 MG/2ML IJ SOLN: 4.0000 mg | Freq: Once | INTRAMUSCULAR | Status: DC | PRN

## 2024-10-14 MED ORDER — VANCOMYCIN HCL 1000 MG IV SOLR
INTRAVENOUS | Status: DC | PRN
  Administered 2024-10-14: 1000 mg via TOPICAL

## 2024-10-14 MED ORDER — BUPIVACAINE LIPOSOME 1.3 % IJ SUSP
INTRAMUSCULAR | Status: DC | PRN
  Administered 2024-10-14: 10 mL

## 2024-10-14 MED ORDER — OXYCODONE HCL 5 MG PO TABS
5.0000 mg | ORAL_TABLET | Freq: Once | ORAL | Status: AC | PRN
  Administered 2024-10-14: 5 mg via ORAL

## 2024-10-14 MED ORDER — SUGAMMADEX SODIUM 200 MG/2ML IV SOLN
INTRAVENOUS | Status: DC | PRN
  Administered 2024-10-14: 200 mg via INTRAVENOUS

## 2024-10-14 MED ORDER — OXYCODONE HCL 5 MG/5ML PO SOLN: 5.0000 mg | Freq: Once | ORAL | Status: AC | PRN

## 2024-10-14 MED ORDER — STERILE WATER FOR IRRIGATION IR SOLN
Status: DC | PRN
  Administered 2024-10-14 (×2): 1000 mL

## 2024-10-14 MED ORDER — OXYCODONE-ACETAMINOPHEN 5-325 MG PO TABS: 1.0000 | ORAL_TABLET | ORAL | 0 refills | Status: AC | PRN

## 2024-10-14 MED ORDER — DEXAMETHASONE SOD PHOSPHATE PF 10 MG/ML IJ SOLN
INTRAMUSCULAR | Status: AC
  Filled 2024-10-14: qty 1

## 2024-10-14 MED ORDER — CELECOXIB 200 MG PO CAPS
200.0000 mg | ORAL_CAPSULE | Freq: Once | ORAL | Status: AC
  Administered 2024-10-14: 200 mg via ORAL
  Filled 2024-10-14: qty 1

## 2024-10-14 MED ORDER — SUCCINYLCHOLINE CHLORIDE 200 MG/10ML IV SOSY
PREFILLED_SYRINGE | INTRAVENOUS | Status: AC
  Filled 2024-10-14: qty 10

## 2024-10-14 MED ORDER — ORAL CARE MOUTH RINSE: 15.0000 mL | Freq: Once | OROMUCOSAL | Status: AC

## 2024-10-14 MED ORDER — LIDOCAINE HCL (PF) 2 % IJ SOLN
INTRAMUSCULAR | Status: DC | PRN
  Administered 2024-10-14: 100 mg via INTRADERMAL

## 2024-10-14 MED ORDER — FENTANYL CITRATE (PF) 100 MCG/2ML IJ SOLN
INTRAMUSCULAR | Status: AC
  Filled 2024-10-14: qty 2

## 2024-10-14 MED ORDER — ACETAMINOPHEN 500 MG PO TABS
1000.0000 mg | ORAL_TABLET | Freq: Once | ORAL | Status: AC
  Administered 2024-10-14: 1000 mg via ORAL
  Filled 2024-10-14: qty 2

## 2024-10-14 MED ORDER — FENTANYL CITRATE (PF) 50 MCG/ML IJ SOSY
25.0000 ug | PREFILLED_SYRINGE | INTRAMUSCULAR | Status: DC | PRN
  Administered 2024-10-14 (×2): 25 ug via INTRAVENOUS

## 2024-10-14 MED ORDER — DEXAMETHASONE SOD PHOSPHATE PF 10 MG/ML IJ SOLN
INTRAMUSCULAR | Status: DC | PRN
  Administered 2024-10-14: 8 mg via INTRAVENOUS

## 2024-10-14 MED ORDER — VANCOMYCIN HCL IN DEXTROSE 1-5 GM/200ML-% IV SOLN
1000.0000 mg | INTRAVENOUS | Status: DC
  Filled 2024-10-14: qty 200

## 2024-10-14 MED ORDER — ONDANSETRON HCL 4 MG/2ML IJ SOLN
INTRAMUSCULAR | Status: AC
  Filled 2024-10-14: qty 2

## 2024-10-14 MED ORDER — BUPIVACAINE-EPINEPHRINE (PF) 0.5% -1:200000 IJ SOLN
INTRAMUSCULAR | Status: DC | PRN
  Administered 2024-10-14: 10 mL

## 2024-10-14 MED ORDER — TRANEXAMIC ACID 1000 MG/10ML IV SOLN: 1000.0000 mg | INTRAVENOUS | Status: DC

## 2024-10-14 MED ORDER — 0.9 % SODIUM CHLORIDE (POUR BTL) OPTIME
TOPICAL | Status: DC | PRN
  Administered 2024-10-14: 1000 mL

## 2024-10-14 MED ORDER — MELOXICAM 15 MG PO TABS: 15.0000 mg | ORAL_TABLET | Freq: Every day | ORAL | 2 refills | Status: AC

## 2024-10-14 MED ORDER — METOCLOPRAMIDE HCL 5 MG/ML IJ SOLN: 5.0000 mg | Freq: Three times a day (TID) | INTRAMUSCULAR | Status: DC | PRN

## 2024-10-14 MED ORDER — CYCLOBENZAPRINE HCL 10 MG PO TABS: 10.0000 mg | ORAL_TABLET | Freq: Three times a day (TID) | ORAL | 1 refills | Status: AC | PRN

## 2024-10-14 MED ORDER — ONDANSETRON HCL 4 MG PO TABS: 4.0000 mg | ORAL_TABLET | Freq: Three times a day (TID) | ORAL | 0 refills | Status: AC | PRN

## 2024-10-14 MED ORDER — FENTANYL CITRATE (PF) 50 MCG/ML IJ SOSY
PREFILLED_SYRINGE | INTRAMUSCULAR | Status: AC
  Filled 2024-10-14: qty 1

## 2024-10-14 MED ORDER — CLEVIDIPINE BUTYRATE 0.5 MG/ML IV EMUL
0.0000 mg/h | INTRAVENOUS | Status: DC
  Filled 2024-10-14: qty 50

## 2024-10-14 MED ORDER — METOCLOPRAMIDE HCL 5 MG PO TABS: 5.0000 mg | ORAL_TABLET | Freq: Three times a day (TID) | ORAL | Status: DC | PRN

## 2024-10-14 MED ORDER — ROCURONIUM BROMIDE 10 MG/ML (PF) SYRINGE
PREFILLED_SYRINGE | INTRAVENOUS | Status: AC
  Filled 2024-10-14: qty 10

## 2024-10-14 MED ORDER — CHLORHEXIDINE GLUCONATE 0.12 % MT SOLN
15.0000 mL | Freq: Once | OROMUCOSAL | Status: AC
  Administered 2024-10-14: 15 mL via OROMUCOSAL

## 2024-10-14 MED ORDER — LACTATED RINGERS IV SOLN: INTRAVENOUS | Status: DC

## 2024-10-14 NOTE — Evaluation (Signed)
 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     Clinical Impressions PTA pt lives at home with wife and was independent up until this am surgery with all aspects of A/IADL's including driving with no AD or assistance for mobility. Wife present for all education in PACU.  Education completed regarding compensatory strategies for ADL tasks and functional mobility, management of sling, L ROM per specified parameters in the order set as indicated below, positioning of operative arm in sitting and supine and edema control, including use of Iceman Cold Therapy machine. Caregiver present for education, written handouts provided and reviewed using Teach Back and pt/caregiver verbalized/demonstrated understanding. Due to the below listed deficits, pt requires min assistance with ADL tasks and Supervision assist with functional mobility on level surfaces- stairs NT. Caregiver will be able to provide necessary level of assistance at discharge. Pt to follow up with MD to progress rehab of the operative shoulder.      If plan is discharge home, recommend the following:   A little help with walking and/or transfers;A little help with bathing/dressing/bathroom;Assistance with cooking/housework;Assist for transportation;Help with stairs or ramp for entrance     Functional Status Assessment   Patient has had a recent decline in their functional status and demonstrates the ability to make significant improvements in function in a reasonable and predictable amount of time.     Equipment Recommendations   None recommended by OT      Precautions/Restrictions    Precautions Precautions: Shoulder Shoulder FF 0-60; ER 0-20; Abd 0-45. ROM for ADL purposes only; AROM elbow/wrist/hand; OK for lap slides and pendulums; Loosen sling from neck when arm is supported in sitting  Shoulder Interventions: Shoulder sling/immobilizer Precaution Booklet Issued: Yes (comment) Required Braces or Orthoses: Sling Restrictions Weight Bearing Restrictions Per Provider Order: Yes LUE Weight Bearing Per Provider Order: Non weight bearing     Mobility Bed Mobility Overal bed mobility:  (instructed in bed mobility and positioning strategies but patient remained in recliner for pre and post session)                  Transfers Overall transfer level: Modified independent                 General transfer comment: sling and falls prevention education with wife for supporting on non operative side      Balance Overall balance assessment: No apparent balance deficits (not formally assessed)                                         ADL either performed or assessed with clinical judgement   ADL Overall ADL's : Needs assistance/impaired    Per orders, L shoulder parameters as follows for ADL tasks: Abd 0-45; ER 0-20; FF 0-60; elbow/wrist/hand ROM only. While moving within specified parameters, pt/caregiver instructed on bathing and how to donn/doff shirt, placing operative arm through sleeve first when donning and off last when doffing.Pt/caregiver educated on compensatory strategies for LB ADL and strategies to reduce risk of falls.  Pt/caregiver educated on donning/doffing sling and to wear  the sling at all times with the exception of ADL, and to loosen the neck strap of the sling when the operative arm is in a supported position when sitting. In sitting or supine, pt instructed to have a pillow behind and under their operative arm to provide support. If assist needed with ambulation, caregiver educated on the importance of walking on pt's  non-operative side.  Education regarding use of IceMan Cold Therapy completed, including the importance of using a barrier on the shoulder prior to positioning the wrap-on pad. Pt/caregiver verbalized/demonstrated understanding. Teach Back used while caregiver assisted with dressing pt and positioning wrap-on pad to facilitate DC.                                          Vision Baseline Vision/History: 0 No visual deficits;1 Wears glasses              Pertinent Vitals/Pain Pain Assessment Pain Assessment: No/denies pain     Extremity/Trunk Assessment Upper Extremity Assessment Upper Extremity Assessment: Left hand dominant;LUE deficits/detail LUE Deficits / Details: post op n block remains active with AAROM L elbow, wrist, hand WFL LUE Coordination: decreased fine motor   Lower Extremity Assessment Lower Extremity Assessment: Overall WFL for tasks assessed   Cervical / Trunk Assessment Cervical / Trunk Assessment: Normal   Communication Communication Communication: No apparent difficulties   Cognition Arousal: Alert Behavior During Therapy: WFL for tasks assessed/performed Cognition: No apparent impairments                               Following commands: Intact       Cueing  General Comments    Verbal   post op L shoulder dressing intact   Exercises Exercises: Shoulder Shoulder Exercises Pendulum Exercise: AAROM, Left, 10 reps, Standing Shoulder Flexion:  (lap slides L side 10 reps AAROM) Elbow Flexion: AAROM, Left, 10 reps Elbow Extension: AAROM, Left, 10 reps Wrist Flexion: AAROM, Left, 10 reps Wrist Extension: AAROM, Left, 10 reps Digit Composite Flexion: AROM, Left, 10 reps Composite Extension: AROM, Left, 10 reps   Shoulder Instructions Shoulder Instructions Donning/doffing shirt without moving shoulder: Caregiver independent with task;Patient able to independently direct caregiver Method for sponge bathing under  operated UE: Caregiver independent with task;Patient able to independently direct caregiver Donning/doffing sling/immobilizer: Caregiver independent with task;Patient able to independently direct caregiver Correct positioning of sling/immobilizer: Caregiver independent with task;Patient able to independently direct caregiver Pendulum exercises (written home exercise program): Caregiver independent with task;Patient able to independently direct caregiver ROM for elbow, wrist and digits of operated UE: Caregiver independent with task;Patient able to independently direct caregiver Sling wearing schedule (on at all times/off for ADL's): Caregiver independent with task;Patient able to independently direct caregiver Proper positioning of operated UE when showering: Caregiver independent with task;Patient able to independently direct caregiver Positioning of UE while sleeping: Caregiver independent with task;Patient able to independently direct caregiver    Home Living Family/patient expects to be discharged to:: Private residence Living Arrangements: Spouse/significant other Available Help at Discharge: Family;Available 24 hours/day Type of Home: House Home Access: Stairs to enter [employer] of Steps: 3 Entrance Stairs-Rails: None Home Layout: One level     Bathroom Shower/Tub: Producer, Television/film/video: Standard     Home Equipment: Firefighter;Shower seat - built in;Hand held shower head  Prior Functioning/Environment Prior Level of Function : Independent/Modified Independent                    OT Problem List: Impaired UE functional use    AM-PAC OT 6 Clicks Daily Activity     Outcome Measure Help from another person eating meals?: None Help from another person taking care of personal grooming?: A Little Help from another person toileting, which includes using toliet, bedpan, or urinal?: A Little Help from another person bathing (including  washing, rinsing, drying)?: A Little Help from another person to put on and taking off regular upper body clothing?: A Little Help from another person to put on and taking off regular lower body clothing?: A Little 6 Click Score: 19   End of Session Equipment Utilized During Treatment: Gait belt;Other (comment) (sling) Nurse Communication: Mobility status  Activity Tolerance: Patient tolerated treatment well Patient left: in chair;with call bell/phone within reach;with family/visitor present;with nursing/sitter in room  OT Visit Diagnosis: Pain Pain - Right/Left: Left Pain - part of body: Shoulder                Time: 1420-1505 OT Time Calculation (min): 45 min Charges:  OT General Charges $OT Visit: 1 Visit OT Evaluation $OT Eval Low Complexity: 1 Low OT Treatments $Self Care/Home Management : 8-22 mins $Therapeutic Exercise: 8-22 mins Uriah Philipson OT/L Acute Rehabilitation Department  [phone]  10/14/2024, 4:29 PM

## 2024-10-14 NOTE — Anesthesia Procedure Notes (Signed)
 Anesthesia Regional Block: Interscalene brachial plexus block   Pre-Anesthetic Checklist: , timeout performed,  Correct Patient, Correct Site, Correct Laterality,  Correct Procedure, Correct Position, site marked,  Risks and benefits discussed,  At surgeon's request and post-op pain management  Laterality: Upper and Left  Prep: chloraprep       Needles:  Injection technique: Single-shot  Needle Type: Echogenic Stimulator Needle     Needle Length: 9cm  Needle Gauge: 20     Additional Needles:   Procedures:,,,, ultrasound used (permanent image in chart),, #20gu IV placed    Narrative:  Start time: 10/14/2024 9:56 AM End time: 10/14/2024 9:56 AM Injection made incrementally with aspirations every 5 mL.  Performed by: Personally

## 2024-10-14 NOTE — Discharge Instructions (Signed)

## 2024-10-14 NOTE — Care Plan (Signed)
 Ortho Bundle Case Management Note  Patient Details  Name: Jerry Ruiz MRN: [id number] Date of Birth: 1951-01-14  Lt Reverse Shoulder Arthroplasty on 10/14/24  DCP: Home with wife  DME: No needs  PT: [facility] [place]                   DME Arranged:  N/A DME Agency:  NA  HH Arranged:    HH Agency:     Additional Comments: Please contact me with any questions of if this plan should need to change.  Burnard Dross, Case Manager [facility] [phone]  Ext. [phone]   10/14/2024, 9:07 AM

## 2024-10-14 NOTE — Progress Notes (Signed)
 Pharmacy Brief Note - Surgical Prophylaxis Antibiotic Note:   Current pre-op  antibiotics orders: vancomycin  + levofloxacin   Pt allergy profile lists cephalexin (reaction: itching). Pt previously received a dose of cefazolin  in July 2021, ok to receive cefazolin  for surgical prophylaxis.   Ronal CHRISTELLA Rav, PharmD 10/14/24 9:57 AM

## 2024-10-14 NOTE — Anesthesia Procedure Notes (Signed)
 Procedure Name: Intubation Date/Time: 10/14/2024 10:42 AM  Performed by: Augusta Daved SAILOR, CRNAPre-anesthesia Checklist: Patient identified, Emergency Drugs available, Suction available and Patient being monitored Patient Re-evaluated:Patient Re-evaluated prior to induction Oxygen Delivery Method: Circle System Utilized Preoxygenation: Pre-oxygenation with 100% oxygen Induction Type: IV induction Ventilation: Mask ventilation without difficulty and Oral airway inserted - appropriate to patient size Laryngoscope Size: Glidescope and 3 Grade View: Grade I Tube type: Oral Tube size: 7.5 mm Number of attempts: 1 Airway Equipment and Method: Stylet and Oral airway Placement Confirmation: ETT inserted through vocal cords under direct vision, positive ETCO2 and breath sounds checked- equal and bilateral Secured at: 24 (at the lip) cm Tube secured with: Tape Dental Injury: Teeth and Oropharynx as per pre-operative assessment

## 2024-10-14 NOTE — H&P (Signed)
 Jerry Ruiz    Chief Complaint: Left shoulder osteoarthritis HPI: The patient is a 74 y.o. male with chronic and progressively increasing left shoulder pain related to severe glenohumeral arthritis as well as rotator cuff dysfunction.  He is brought to the operating today for planned left shoulder reverse arthroplasty.  Past Medical History:  Diagnosis Date   Arthritis    Bifascicular block    pvc's   Bladder stone    Headache    History of echocardiogram    a. Echo 12/15: EF 55-60%, no RWMA, mild LAE, trivial MR   History of echocardiogram    Echo 12/17: mod conc LVH, EF 55-60, no RWMA, Gr 1 DD, mild AI, calcified AV leaflets, trivial MR, mod LAE, normal RVSF, mild TR, no pericardial eff   HTN (hypertension)    Pneumonia    PVC's (premature ventricular contractions)    Skin cancer    melanoma bil ears   Sleep apnea    cpap      Past Surgical History:  Procedure Laterality Date   BLADDER SURGERY     for bladder stone   DECOMPRESSIVE LUMBAR LAMINECTOMY LEVEL 1 N/A 03/18/2024   Procedure: DECOMPRESSIVE LUMBAR LAMINECTOMY LEVEL LUMBAR FOUR-FIVE, EXCISION OF SYNOVIAL CYST MICRODISCETOMY;  Surgeon: Duwayne Purchase, MD;  Location: [facility] OR;  Service: Orthopedics;  Laterality: N/A;   HEMORROIDECTOMY     implantable loop recorder implantation  11/29/2019   Medtronic Reveal Linq model LNQ 22 (SN [id number]) implanted by Dr Kelsie for further evaluation of palpitations and arrhythmia management   INGUINAL HERNIA REPAIR Bilateral 03/28/2020   Procedure: LAPAROSCOPIC BILATERAL INGUINAL HERNIA REPAIR WITH MESH;  Surgeon: Kinsinger, Herlene Righter, MD;  Location: [facility] ORS;  Service: General;  Laterality: Bilateral;   KNEE SURGERY     arthroscopic bil   LOOP RECORDER REMOVAL N/A 06/01/2024   Procedure: LOOP RECORDER REMOVAL;  Surgeon: Inocencio Soyla Lunger, MD;  Location: [facility] INVASIVE CV LAB;  Service: Cardiovascular;  Laterality: N/A;   MOHS SURGERY     PACEMAKER IMPLANT N/A 06/01/2024    Procedure: PACEMAKER IMPLANT;  Surgeon: Inocencio Soyla Lunger, MD;  Location: [facility] INVASIVE CV [facility];  Service: Cardiovascular;  Laterality: N/A;   right big toe     bone removed   SHOULDER SURGERY     rotator right    TONSILLECTOMY     VASECTOMY     1987    Family History  Problem Relation Age of Onset   Arrhythmia Mother    Hypertension Mother    Heart disease Mother    Parkinson's disease Father    Hypertension Father     Social History:  reports that he quit smoking about 53 years ago. His smoking use included cigarettes. He started smoking about 58 years ago. He has never used smokeless tobacco. He reports current alcohol use. He reports that he does not use drugs.  BMI: Estimated body mass index is 28.26 kg/m as calculated from the following:   Height as of 10/08/24: 6' (1.829 m).   Weight as of 10/08/24: 94.5 kg.  Lab Results  Component Value Date   ALBUMIN  4.3 05/24/2014   Diabetes: Patient does not have a diagnosis of diabetes.     Smoking Status:       No medications prior to admission.     Physical Exam: Left shoulder demonstrates painful and guarded motion is noted at his recent office visits.  He has globally decreased strength to manual muscle testing.  Otherwise neurovascular intact in  the left upper extremity.  Imaging studies confirm changes consistent with severe glenohumeral arthritis.  Vitals     Assessment/Plan  Impression: Left shoulder osteoarthritis with rotator cuff dysfunction  Plan of Action: Procedures: ARTHROPLASTY, SHOULDER, TOTAL, REVERSE  Takai Chiaramonte M Ori Trejos 10/14/2024, 6:28 AM Contact # [phone]

## 2024-10-14 NOTE — Op Note (Signed)
 10/14/2024  11:53 AM  PATIENT:   Jerry Ruiz  74 y.o. male  PRE-OPERATIVE DIAGNOSIS:  Left shoulder osteoarthritis with severe rotator cuff dysfunction  POST-OPERATIVE DIAGNOSIS: Same  PROCEDURE: Left shoulder reverse arthroplasty lysing a press-fit size 6 short Arthrex humeral stem with a neutral metathesis, +3 polyethylene insert, 36/+4 glenosphere and a small/+2 baseplate, 30 mm lag screw  SURGEON:  Juventino Pavone, Franky BATTLE M.D.  ASSISTANTS: Randine Ricks, PA-C  Randine Ricks, PA-C was utilized as an geophysicist/field seismologist throughout this case, essential for help with positioning the patient, positioning extremity, tissue manipulation, implantation of the prosthesis, suture management, wound closure, and intraoperative decision-making.  ANESTHESIA:   General Endotracheal and interscalene block with Exparel   EBL: 200 cc  SPECIMEN: None  Drains: None   PATIENT DISPOSITION:  PACU - hemodynamically stable.    PLAN OF CARE: Discharge to home after PACU  Brief history:  Patient is a 74 year old gentleman followed for chronic and progressively increasing left shoulder pain related to severe glenohumeral arthritis and associated rotator cuff dysfunction.  Due to his increasing functional limitations and failure to respond to prolonged attempts at conservative management, he was brought to the operating room today for planned left shoulder reverse arthroplasty.  Preoperatively, I counseled the patient regarding treatment options and risks versus benefits thereof.  Possible surgical complications were all reviewed including potential for bleeding, infection, neurovascular injury, persistent pain, loss of motion, anesthetic complication, failure of the implant, and possible need for additional surgery. They understand and accept and agrees with our planned procedure.   Procedure detail:  After undergoing routine preop evaluation the patient received prophylactic antibiotics and interscalene block with  Exparel  was established in the holding area by the anesthesia department.  Subsequently placed supine on the operating able and underwent the smooth induction of a general endotracheal anesthesia.  Placed into the beachchair position and appropriately padded and protected.  The left shoulder girdle region was sterilely prepped and draped in standard fashion.  Timeout was called.  A deltopectoral approach to the left shoulder was made with sharp dissection and electrocautery was used for hemostasis.  The deltopectoral interval was then developed from proximal to distal with the vein taken laterally.  The conjoined tendon was mobilized and retracted medially.  The long head biceps tendon was then tenodesed at the upper border of the pectoralis major tendon with the proximal segment unroofed and excised.  The superior rotator cuff was split from the apex of the bicipital groove to the base of the coracoid and the subscapularis was then separated from the lesser tuberosity using electrocautery and tagged with a pair of grasping suture tape sutures.  Capsular attachments were then divided from the anterior and inferior margins of the humeral neck allowing delivery of the humeral head through the wound.  An extra medullary guide was then used to outline our proposed humeral head resection which we performed with an oscillating saw at approximately 20 degrees of retroversion.  Large marginal osteophytes were then removed with a rondure and a metal cap was then placed over the cut proximal humeral surface.  Of note the metaphysis was of relatively small dimension appropriate for a 36 mm metaphysis.  We then exposed the glenoid and performed a circumferential labral resection.  A guidepin was then directed into the center of the glenoid and the glenoid was then prepared with the central followed by the peripheral reamer down to stable subchondral bony bed.  Preparation completed with a drill and tapped for a 30  mm lag screw.   At this point the baseplate was assembled and then inserted with vancomycin  powder applied to the threads of the lag screw and excellent fixation was achieved.  All of the peripheral locking screws were then placed using standard technique with excellent fixation.  A 36/+4 glenosphere was then impacted onto the baseplate and a central locking screw was placed.  Attention then returned back to the proximal humerus where the canal was opened by hand reaming of note he had extremely sclerotic bone.  We broached to a size 6 short humeral stem at approximate 20 degrees of retroversion.  A neutral metaphyseal reaming guide was then used to prepare the metaphysis.  A trial implant was then placed and a trial reduction showed good motion stability and soft tissue balance.  This point the trial was then removed.  The canal was irrigated cleaned and dried.  Vancomycin  powder applied in canal.  The implant was assembled and inserted with excellent fixation.  A trial reduction was then performed and ultimately felt that the +3 poly gives the best motion stability and soft tissue balance.  Trial was then removed.  The final poly was impacted on the implant after was cleaned and dried.  Our final reduction showed excellent motion stability and soft tissue balance.  At this point final irrigation was completed.  Hemostasis was obtained.  The balance of the vancomycin  powder was spread Librelease throughout the deep soft tissue planes.  The deltopectoral interval was reapproximated with a series of figure-of-eight number Vicryl sutures.  2-0 Monocryl used to close the subcu layer and intracuticular 3-0 Monocryl used to close the skin followed by Steri-Strips and Aquacel dressing.  The left arm placed into a sling.  The patient was awakened, extubated, and taken to the recovery in stable condition.  Franky CHRISTELLA Pointer MD   Contact # [phone]

## 2024-10-14 NOTE — Transfer of Care (Signed)
 Immediate Anesthesia Transfer of Care Note  Patient: Jerry Ruiz  Procedure(s) Performed: ARTHROPLASTY, SHOULDER, TOTAL, REVERSE (Left: Shoulder)  Patient Location: PACU  Anesthesia Type:General  Level of Consciousness: oriented, drowsy, and patient cooperative  Airway & Oxygen Therapy: Patient Spontanous Breathing and Patient connected to face mask oxygen  Post-op Assessment: Report given to RN and Post -op Vital signs reviewed and stable  Post vital signs: Reviewed and stable  Last Vitals:  Vitals Value Taken Time  BP 153/97 10/14/24 12:19  Temp    Pulse 100 10/14/24 12:22  Resp 20 10/14/24 12:22  SpO2 97 % 10/14/24 12:22  Vitals shown include unfiled device data.  Last Pain:  Vitals:   10/14/24 0807  TempSrc: Oral  PainSc:          Complications: No notable events documented.

## 2024-10-14 NOTE — Anesthesia Postprocedure Evaluation (Signed)
"   Anesthesia Post Note  Patient: Jerry Ruiz  Procedure(s) Performed: ARTHROPLASTY, SHOULDER, TOTAL, REVERSE (Left: Shoulder)     Patient location during evaluation: PACU Anesthesia Type: Regional Level of consciousness: awake Pain management: pain level controlled Vital Signs Assessment: post-procedure vital signs reviewed and stable Respiratory status: spontaneous breathing Cardiovascular status: stable and blood pressure returned to baseline Postop Assessment: no apparent nausea or vomiting and adequate PO intake Anesthetic complications: no Comments: Medtronic pacemaker reprogrammed to synchronous mode prior to Phase II. VSS throughout.    No notable events documented.                  Lauraine DASEN Colhoun      "

## 2024-10-15 ENCOUNTER — Ambulatory Visit

## 2025-01-14 ENCOUNTER — Encounter

## 2025-04-15 ENCOUNTER — Encounter

## 2025-07-15 ENCOUNTER — Encounter
